# Patient Record
Sex: Male | Born: 1945
Health system: Southern US, Community
[De-identification: ages and names within clinical notes are randomized; demographics above are authoritative.]

## PROBLEM LIST (undated history)

## (undated) DIAGNOSIS — I4891 Unspecified atrial fibrillation: Secondary | ICD-10-CM

## (undated) DIAGNOSIS — R079 Chest pain, unspecified: Secondary | ICD-10-CM

## (undated) DIAGNOSIS — E785 Hyperlipidemia, unspecified: Secondary | ICD-10-CM

## (undated) DIAGNOSIS — R002 Palpitations: Secondary | ICD-10-CM

## (undated) DIAGNOSIS — I10 Essential (primary) hypertension: Secondary | ICD-10-CM

## (undated) DIAGNOSIS — Z8249 Family history of ischemic heart disease and other diseases of the circulatory system: Secondary | ICD-10-CM

## (undated) DIAGNOSIS — K219 Gastro-esophageal reflux disease without esophagitis: Secondary | ICD-10-CM

## (undated) HISTORY — DX: Hyperlipidemia, unspecified: E78.5

## (undated) HISTORY — PX: VASECTOMY: SHX75

## (undated) HISTORY — DX: Essential (primary) hypertension: I10

## (undated) HISTORY — PX: KNEE ARTHROSCOPY: SHX127

## (undated) HISTORY — DX: Unspecified atrial fibrillation: I48.91

## (undated) HISTORY — PX: ELBOW SURGERY: SHX618

## (undated) HISTORY — DX: Chest pain, unspecified: R07.9

## (undated) HISTORY — DX: Palpitations: R00.2

## (undated) HISTORY — DX: Family history of ischemic heart disease and other diseases of the circulatory system: Z82.49

## (undated) HISTORY — DX: Gastro-esophageal reflux disease without esophagitis: K21.9

---

## 1998-05-25 ENCOUNTER — Ambulatory Visit (HOSPITAL_COMMUNITY): Admission: RE | Admit: 1998-05-25 | Discharge: 1998-05-25 | Payer: Self-pay | Admitting: Gastroenterology

## 2004-04-16 ENCOUNTER — Ambulatory Visit: Payer: Self-pay | Admitting: Cardiology

## 2004-05-28 ENCOUNTER — Ambulatory Visit: Payer: Self-pay | Admitting: Cardiology

## 2009-04-05 ENCOUNTER — Telehealth (INDEPENDENT_AMBULATORY_CARE_PROVIDER_SITE_OTHER): Payer: Self-pay | Admitting: *Deleted

## 2009-04-05 ENCOUNTER — Emergency Department (HOSPITAL_COMMUNITY): Admission: EM | Admit: 2009-04-05 | Discharge: 2009-04-05 | Payer: Self-pay | Admitting: Emergency Medicine

## 2009-04-05 ENCOUNTER — Ambulatory Visit: Payer: Self-pay | Admitting: Cardiology

## 2009-04-09 ENCOUNTER — Ambulatory Visit: Payer: Self-pay | Admitting: Internal Medicine

## 2009-04-09 ENCOUNTER — Encounter (HOSPITAL_COMMUNITY): Admission: RE | Admit: 2009-04-09 | Discharge: 2009-05-30 | Payer: Self-pay | Admitting: Cardiology

## 2009-04-09 ENCOUNTER — Ambulatory Visit: Payer: Self-pay

## 2009-04-11 DIAGNOSIS — E785 Hyperlipidemia, unspecified: Secondary | ICD-10-CM | POA: Insufficient documentation

## 2009-04-11 DIAGNOSIS — I10 Essential (primary) hypertension: Secondary | ICD-10-CM | POA: Insufficient documentation

## 2009-04-11 DIAGNOSIS — K219 Gastro-esophageal reflux disease without esophagitis: Secondary | ICD-10-CM | POA: Insufficient documentation

## 2009-04-17 ENCOUNTER — Ambulatory Visit: Payer: Self-pay | Admitting: Cardiology

## 2009-04-17 DIAGNOSIS — R079 Chest pain, unspecified: Secondary | ICD-10-CM | POA: Insufficient documentation

## 2010-03-05 ENCOUNTER — Encounter: Payer: Self-pay | Admitting: Physician Assistant

## 2010-03-26 NOTE — Assessment & Plan Note (Signed)
Summary: eph/per Joseph Smith call/lg   Visit Type:  EPH Primary Provider:  Nena Jordan  CC:  pt states he has no complaints .  History of Present Illness: Joseph Smith comes in today for followup of his chest discomfort.  He was seen at urgent medical care and we sent to the emergency room. He they're saw Dr. Ron Parker who felt this was not cardiac. He was set up for a stress Myoview which was obtained on April 09, 2009. His ejection fraction was 60% with normal Kameren Pargas motion and no ischemia.  He's had no further symptoms. 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 He does has risk factors which are being addressed by his primary care.  Current Medications (verified): 1)  Zantac 300 Mg Tabs (Ranitidine Hcl) .Marland Kitchen.. 1 Tab Two Times A Day 2)  Simvastatin 40 Mg Tabs (Simvastatin) .Marland Kitchen.. 1 Tab At Bedtime 3)  Lotrel 10-20 Mg Caps (Amlodipine Besy-Benazepril Hcl) .Marland Kitchen.. 1 Cap Once Daily 4)  Advil 200 Mg Tabs (Ibuprofen) .Marland Kitchen.. 1 Tab 3-4 Times Daily 5)  Aspirin 81 Mg Tbec (Aspirin) .... Take One Tablet By Mouth Daily  Allergies: 1)  ! Codeine  Past History:  Past Surgical History: Last updated: 04/11/2009 Knee Arthroscopy.... right Elbow surgery.Marland Kitchenleft  Review of Systems       negative history of present illness  Vital Signs:  Patient profile:   65 year old male Height:      73 inches Weight:      216 pounds BMI:     28.60 Pulse rate:   64 / minute Pulse rhythm:   regular BP sitting:   130 / 80  (left arm) Cuff size:   large  Vitals Entered By: [name], CMA (April 17, 2009 11:34 AM)  Physical Exam  General:  Well developed, well nourished, in no acute distress. Head:  normocephalic and atraumatic Eyes:  PERRLA/EOM intact; conjunctiva and lids normal. Neck:  Neck supple, no JVD. No masses, thyromegaly or abnormal cervical nodes. Chest Oneida Mckamey:  no deformities or breast masses noted Lungs:  Clear bilaterally to auscultation and percussion. Heart:  Non-displaced PMI, chest non-tender; regular rate  and rhythm, S1, S2 without murmurs, rubs or gallops. Carotid upstroke normal, no bruit. Normal abdominal aortic size, no bruits. Femorals normal pulses, no bruits. Pedals normal pulses. No edema, no varicosities. Abdomen:  Bowel sounds positive; abdomen soft and non-tender without masses, organomegaly, or hernias noted. No hepatosplenomegaly. Msk:  Back normal, normal gait. Muscle strength and tone normal. Pulses:  pulses normal in all 4 extremities Extremities:  No clubbing or cyanosis. Neurologic:  Alert and oriented x 3. Skin:  Intact without lesions or rashes. Psych:  Normal affect.   Problems:  Medical Problems Added: 1)  Dx of Chest Pain-unspecified  (ICD-786.50) 2)  Dx of Chest Pain-unspecified  (ICD-786.50)  Impression & Recommendations:  Problem # 1:  CHEST PAIN-UNSPECIFIED (ICD-786.50) Assessment Improved he Then has had no recurrences. I have reviewed the findings of the stress test. No further workup indicated. 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 No limitations from a cardiac standpoint. His updated medication list for this problem includes:    Lotrel 10-20 Mg Caps (Amlodipine besy-benazepril hcl) .Marland Kitchen... 1 cap once daily    Aspirin 81 Mg Tbec (Aspirin) .Marland Kitchen... Take one tablet by mouth daily  Problem # 2:  CORONARY ARTERY DISEASE, PREMATURE, FAMILY HX (ICD-V17.3) Assessment: Unchanged  Orders: EKG w/ Interpretation (93000)  Patient Instructions: 1)  Your physician recommends that you schedule a follow-up  appointment in: [place] 2)  Your physician recommends that you continue on your current medications as directed. Please refer to the Current Medication list given to you today.

## 2010-03-26 NOTE — Progress Notes (Signed)
Summary: Nuclear pre procedure  Phone Note Outgoing Call Call back at Home Phone [phone]   Call placed by: Valetta Fuller, [place],  April 05, 2009 5:16 PM Call placed to: Patient Summary of Call: Reviewed information on Myoview Information Sheet (see scanned document for further details).  Spoke with patient.      Nuclear Med Background Indications for Stress Test: Evaluation for Ischemia, Post Hospital  Indications Comments: 04/05/09 NHA:[id number] and chest pain, negative enzymes    Symptoms: Chest Pain    Nuclear Pre-Procedure Cardiac Risk Factors: Family History - CAD, Hypertension, Lipids

## 2010-03-26 NOTE — Assessment & Plan Note (Signed)
Summary: Cardiology Nuclear Study  Nuclear Med Background Indications for Stress Test: Evaluation for Ischemia, [facility]  Indications Comments: 04/05/09 NID:[id number] and chest pain, negative enzymes  History: Abnormal EKG, GXT  History Comments:  ~14yr. ago GXT:OK per patient  Symptoms: Chest Pain, Diaphoresis, Rapid HR  Symptoms Comments: Last episode of CRW:ERXVsince discharge.   Nuclear Pre-Procedure Cardiac Risk Factors: Family History - CAD, History of Smoking, Hypertension, Lipids Caffeine/Decaff Intake: None NPO After: 6:00 PM Lungs: Clear IV 0.9% NS with Angio Cath: 22g     IV Site: (R) AC IV Started by: PIrven BaltimoreRN Chest Size (in) 44     Height (in): 73 Weight (lb): 211 BMI: 27.94  Nuclear Med Study 1 or 2 day study:  1 day     Stress Test Type:  LCarlton AdamReading MD:  DGlori Bickers MD     Referring MD:  TJenell Milliner MD Resting Radionuclide:  Technetium 943metrofosmin     Resting Radionuclide Dose:  10.6 mCi  Stress Radionuclide:  Technetium 9930mtrofosmin     Stress Radionuclide Dose:  32.0 mCi   Stress Protocol   Lexiscan: 0.4 mg   Stress Test Technologist:  SheValetta FullerA-N     Nuclear Technologist:  WanMariann Lasteral RT-N  Rest Procedure  Myocardial perfusion imaging was performed at rest 45 minutes following the intravenous administration of Myoview Technetium 44m64mrofosmin.  Stress Procedure  The patient received IV Lexiscan 0.4 mg over 15-seconds.  Myoview injected at 30-seconds.  There were more diffuse ST changes with lexiscan.  He denied any chest pain.  Quantitative spect images were obtained after a 45 minute delay.  QPS Raw Data Images:  Normal; no motion artifact; normal heart/lung ratio. Stress Images:  There is normal uptake in all areas. Rest Images:  Normal homogeneous uptake in all areas of the myocardium. Subtraction (SDS):  Normal Transient Ischemic Dilatation:  1.07  (Normal <1.22)  Lung/Heart Ratio:  .29  (Normal  <0.45)  Quantitative Gated Spect Images QGS EDV:  119 ml QGS ESV:  45 ml QGS EF:  62 % QGS cine images:  Normal  Findings Normal nuclear study      Overall Impression  Exercise Capacity: [place] with no exercise. ECG Impression: Baseline: NSR; No significant ST segment change with Lexiscan. Overall Impression: Normal stress nuclear study.  Appended Document: Cardiology Nuclear Study stable, reassurance. [facility]  Appended Document: Cardiology Nuclear Study LMTCB../CYMarland Kitchen Appended Document: Cardiology Nuclear Study PT AWARE./CY

## 2010-04-23 ENCOUNTER — Encounter: Payer: Self-pay | Admitting: Physician Assistant

## 2010-04-23 ENCOUNTER — Encounter (INDEPENDENT_AMBULATORY_CARE_PROVIDER_SITE_OTHER): Payer: BC Managed Care – PPO

## 2010-04-23 ENCOUNTER — Other Ambulatory Visit: Payer: Self-pay | Admitting: Physician Assistant

## 2010-04-23 ENCOUNTER — Ambulatory Visit (INDEPENDENT_AMBULATORY_CARE_PROVIDER_SITE_OTHER): Payer: BC Managed Care – PPO | Admitting: Physician Assistant

## 2010-04-23 DIAGNOSIS — R079 Chest pain, unspecified: Secondary | ICD-10-CM

## 2010-04-23 DIAGNOSIS — R002 Palpitations: Secondary | ICD-10-CM

## 2010-04-23 LAB — BASIC METABOLIC PANEL
BUN: 15 mg/dL (ref 6–23)
CO2: 27 mEq/L (ref 19–32)
Calcium: 8.8 mg/dL (ref 8.4–10.5)
Chloride: 99 mEq/L (ref 96–112)
Creatinine, Ser: 0.9 mg/dL (ref 0.4–1.5)
GFR: 87.08 mL/min (ref >60.00)
Glucose, Bld: 88 mg/dL (ref 70–99)
Potassium: 4.6 mEq/L (ref 3.5–5.1)
Sodium: 136 mEq/L (ref 135–145)

## 2010-04-23 LAB — MAGNESIUM: Magnesium: 2.2 mg/dL (ref 1.5–2.5)

## 2010-04-29 ENCOUNTER — Telehealth: Payer: Self-pay | Admitting: Cardiology

## 2010-05-02 NOTE — Assessment & Plan Note (Signed)
Summary: rov/Irregular heart beat/per pt call=mj   Primary Provider:  Nena Jordan   History of Present Illness: Primary Cardiologist:  Dr. Jenell Milliner   Joseph Smith is a 65 yo male with a h/o hypertension, hyperlipidemia and GERD who presents for evaluation of an irregular heartbeat.  He had a Myoview in Feb. 2011 that demonstrated an EF of 62% and no ischemia.  He recently was noted to have Hemoccult-positive stools on physical exam.  He was referred to gastroenterology and underwent colonoscopy.  He tells me that this was a somewhat rough procedure.  He awoke several times.  He was diagnosed with a low level colitis and placed on mesalamine.  He then developed an upper respiratory tract infection as well as a gastroenteritis.  He was sick for about 2 weeks.  As his symptoms were improving, he began to note palpitations.  These can occur at any time.  It feels like his heart is irregular.  He denies rapid palpitations.  He denies any associated chest pain, shortness of breath, syncope or near syncope.  He denies exertional chest discomfort or shortness of breath.  He denies orthopnea, PND or pedal edema.  Current Medications (verified): 1)  Zantac 300 Mg Tabs (Ranitidine Hcl) .Marland Kitchen.. 1 Tab Two Times A Day 2)  Pravastatin Sodium 40 Mg Tabs (Pravastatin Sodium) .... Once Daily 3)  Lotrel 10-20 Mg Caps (Amlodipine Besy-Benazepril Hcl) .Marland Kitchen.. 1 Cap Once Daily 4)  Advil 200 Mg Tabs (Ibuprofen) .Marland Kitchen.. 1 Tab 3-4 Times Daily 5)  Lorazepam 0.5 Mg Tabs (Lorazepam) .... As Needed, At Bedtime 6)  Lialda 1.2 Gm Tbec (Mesalamine) .... 4 Tabs Every Morning  Allergies (verified): 1)  ! Codeine  Past History:  Past Medical History: HYPERLIPIDEMIA (ICD-272.4) HYPERTENSION (ICD-401.9) GERD (ICD-530.81) Myoview 03/2009: EF 62%; no ischemia Ulcerative Colitis  Social History: He lives in [place] with his wife.   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     He has an approximately 25  pack-year history of tobacco use and has smoked cigars in the past but  has done no tobacco products for at least 5-10 years.  He drinks 2-3 ounces of alcohol a day.  He denies drug use.   Review of Systems       As per  the HPI.  All other systems reviewed and negative.   Vital Signs:  Patient profile:   65 year old male Height:      73 inches Weight:      221 pounds BMI:     29.26 Pulse rate:   75 / minute BP sitting:   130 / 77  (left arm) Cuff size:   large  Vitals Entered By: Hansel Feinstein CMA (April 23, 2010 11:34 AM)  Physical Exam  General:  Well nourished, well developed, in no acute distress HEENT: normal Neck: no JVD Cardiac:  normal S1, S2; RRR; no murmur Lungs:  clear to auscultation bilaterally, no wheezing, rhonchi or rales Abd: soft, nontender, no hepatomegaly Ext: no  edema Vascular: no carotid  bruits Skin: warm and dry Neuro:  CNs 2-12 intact, no focal abnormalities noted Endo: no thyromegaly   EKG  Procedure date:  04/23/2010  Findings:      normal sinus rhythm Heart rate 73 Normal axis Nonspecific ST-T wave changes  Impression & Recommendations:  Problem # 1:  PALPITATIONS (ICD-785.1) Etiology unclear.  With his history of hypertension, he is certainly at risk for developing atrial  fibrillation.  His symptoms do not sound consistent with this.  However, I will pursue further workup to include an echocardiogram to assess cardiac structure and an event monitor.  I also suspect that he may have some electrolyte abnormalities from his recent illness and may be having PVC/PACs.  I will obtain a basic metabolic panel and magnesium.  He did have a TSH done with his primary care provider recently.  We will obtain those results.  No medication changes were made today.  I will bring him back in followup with Dr. Verl Blalock.  Other Orders: EKG w/ Interpretation (93000) TLB-BMP (Basic Metabolic Panel-BMET)  (89791-RWCHJSC) TLB-Magnesium (Mg) (83735-MG) Event (Event) Echocardiogram (Echo)  Patient Instructions: 1)  Your physician recommends that you schedule a follow-up appointment in:  3-4 weeks with Dr. Verl Blalock as per Richardson Dopp, PA-C. 2)  Your physician has recommended that you wear an event monitor.  Event monitors are medical devices that record the heart's electrical activity. Doctors most often use these monitors to diagnose arrhythmias. Arrhythmias are problems with the speed or rhythm of the heartbeat. The monitor is a small, portable device. You can wear one while you do your normal daily activities. This is usually used to diagnose what is causing palpitations/syncope (passing out). 3)  Your physician has requested that you have an echocardiogram.  Echocardiography is a painless test that uses sound waves to create images of your heart. It provides your doctor with information about the size and shape of your heart and how well your heart's chambers and valves are working.  This procedure takes approximately one hour. There are no restrictions for this procedure. 4)  Your physician recommends that you return for lab work in: Today BMET 785.1, 786.50, MAG 785.1 5)  Your physician recommends that you continue on your current medications as directed. Please refer to the Current Medication list given to you today.  Appended Document: rov/Irregular heart beat/per pt call=mj TSH 3.731 on 03/05/10

## 2010-05-03 ENCOUNTER — Ambulatory Visit (HOSPITAL_COMMUNITY): Payer: BC Managed Care – PPO | Attending: Cardiology

## 2010-05-03 DIAGNOSIS — Z8249 Family history of ischemic heart disease and other diseases of the circulatory system: Secondary | ICD-10-CM | POA: Insufficient documentation

## 2010-05-03 DIAGNOSIS — E785 Hyperlipidemia, unspecified: Secondary | ICD-10-CM | POA: Insufficient documentation

## 2010-05-03 DIAGNOSIS — I10 Essential (primary) hypertension: Secondary | ICD-10-CM | POA: Insufficient documentation

## 2010-05-03 DIAGNOSIS — R079 Chest pain, unspecified: Secondary | ICD-10-CM | POA: Insufficient documentation

## 2010-05-03 DIAGNOSIS — R072 Precordial pain: Secondary | ICD-10-CM

## 2010-05-07 NOTE — Progress Notes (Signed)
Summary: Monitor  Phone Note Outgoing Call   Call placed by: Thompson Grayer, RN, BSN,  April 29, 2010 9:49 AM Call placed to: Patient Summary of Call: Pt wearing monitor which shows new Afib. Reviewed by Dr. Verl Blalock who gave orders to start pt on metoprolol succinate 50 mg by mouth daily and ASA 325 mg by mouth daily and schedule office visit with Dr. Verl Blalock on May 10, 2010. Pt also needs to keep scheduled appt for echo on March 9.  Left message on home and work number to call back. Initial call taken by: Thompson Grayer, RN, BSN,  April 29, 2010 9:54 AM  Follow-up for Phone Call        Spoke with pt and gave him monitor results. Pt states he is feeling OK and not aware of irregular heart beat.  Pt given instructions from Dr. Verl Blalock regarding metoprolol succinate and ASA. Will send to [facility] per pt request. Pt scheduled for follow up with Dr. Verl Blalock on March 16 at 10:15. Pt aware of scheduled echo appt. Follow-up by: Thompson Grayer, RN, BSN,  April 29, 2010 10:34 AM    New/Updated Medications: ASPIRIN EC 325 MG TBEC (ASPIRIN) Take one tablet by mouth daily METOPROLOL SUCCINATE 50 MG XR24H-TAB (METOPROLOL SUCCINATE) Take one tablet by mouth daily Prescriptions: METOPROLOL SUCCINATE 50 MG XR24H-TAB (METOPROLOL SUCCINATE) Take one tablet by mouth daily  #30 x 6   Entered by:   Thompson Grayer, RN, BSN   Authorized by:   Renella Cunas, MD, [facility]   Signed by:   Thompson Grayer, RN, BSN on 04/29/2010   Method used:   Electronically to        [id number] (retail)       [address]       [place], [place]  [address]       Ph: [phone] or [phone]       Fax: [phone]   RxID:   [id number]

## 2010-05-10 ENCOUNTER — Ambulatory Visit (INDEPENDENT_AMBULATORY_CARE_PROVIDER_SITE_OTHER): Payer: BC Managed Care – PPO | Admitting: Cardiology

## 2010-05-10 ENCOUNTER — Encounter: Payer: Self-pay | Admitting: Cardiology

## 2010-05-10 DIAGNOSIS — I4891 Unspecified atrial fibrillation: Secondary | ICD-10-CM

## 2010-05-14 NOTE — Assessment & Plan Note (Signed)
Summary: Joseph Smith   Visit Type:  Follow-up Primary Provider:  Nena Smith  CC:  no complaints.  History of Present Illness: Joseph Smith returns for followup of newly diagnosed PAF. See monitor report. Echo was nl except for mild Joseph and mild LAE. Stress Nuclear negative. Tolerating metoprolol without difficulty.  Current Medications (verified): 1)  Zantac 300 Mg Tabs (Ranitidine Hcl) .Marland Kitchen.. 1 Tab Two Times A Day 2)  Pravastatin Sodium 40 Mg Tabs (Pravastatin Sodium) .... Once Daily 3)  Lotrel 10-20 Mg Caps (Amlodipine Besy-Benazepril Hcl) .Marland Kitchen.. 1 Cap Once Daily 4)  Advil 200 Mg Tabs (Ibuprofen) .Marland Kitchen.. 1 Tab Joseph Times Daily 5)  Lorazepam 0.5 Mg Tabs (Lorazepam) .... As Needed, At Bedtime 6)  Aspirin Ec 325 Mg Tbec (Aspirin) .... Take One Tablet By Mouth Daily 7)  Metoprolol Succinate 50 Mg Xr24h-Tab (Metoprolol Succinate) .... Take One Tablet By Mouth Daily  Allergies (verified): 1)  ! Codeine  Past History:  Past Medical History: Last updated: 04/23/2010 HYPERLIPIDEMIA (ICD-272.4) HYPERTENSION (ICD-401.9) GERD (ICD-530.81) Myoview 03/2009: EF 62%; no ischemia Ulcerative Colitis  Past Surgical History: Last updated: 04/11/2009 Knee Arthroscopy.... right Elbow surgery.Marland Kitchenleft  Family History: Last updated: 04/11/2009 Family history of premature coronary artery disease.   His mother died at 34 with no history of heart disease  and his father died at 13 with a long history of cardiac issues.  No  siblings have coronary artery disease.     Social History: Last updated: 04/23/2010 He lives in [place] with his wife.   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   He has an approximately 25  pack-year history of tobacco use and has smoked cigars in the past but  has done no tobacco products for at least 5-10 years.  He drinks 2-3 ounces of alcohol a day.  He denies drug use.   Vital Signs:  Patient profile:   65 year old  male Height:      73 inches Weight:      220.50 pounds BMI:     29.20 Pulse rate:   64 / minute BP sitting:   114 / 78  (left arm) Cuff size:   large  Vitals Entered By: Joseph Smith (May 10, 2010 10:25 AM)  Physical Exam  General:  Well developed, well nourished, in no acute distress. Head:  normocephalic and atraumatic Heart:  RRR, Nl S1 and S2   Impression & Recommendations:  Problem # 1:  ATRIAL FIBRILLATION (ICD-427.31) Assessment New In NSR today. All studies reviewed. No change in meds. His updated medication list for this problem includes:    Aspirin Ec 325 Mg Tbec (Aspirin) .Marland Kitchen... Take one tablet by mouth daily    Metoprolol Succinate 50 Mg Xr24h-tab (Metoprolol succinate) .Marland Kitchen... Take one tablet by mouth daily  Problem # 2:  PALPITATIONS (ICD-785.1) Assessment: Improved  His updated medication list for this problem includes:    Lotrel 10-20 Mg Caps (Amlodipine besy-benazepril hcl) .Marland Kitchen... 1 cap once daily    Aspirin Ec 325 Mg Tbec (Aspirin) .Marland Kitchen... Take one tablet by mouth daily    Metoprolol Succinate 50 Mg Xr24h-tab (Metoprolol succinate) .Marland Kitchen... Take one tablet by mouth daily  Patient Instructions: 1)  Your physician recommends that you schedule a follow-up appointment in: 1 year with Dr. Verl Smith 2)  Your physician recommends that you continue on your current medications as directed. Please refer to the Current Medication list given to you today.

## 2010-05-16 LAB — POCT CARDIAC MARKERS
CKMB, poc: 1.5 ng/mL (ref 1.0–8.0)
CKMB, poc: <1 ng/mL — ABNORMAL LOW (ref 1.0–8.0)
Myoglobin, poc: 52.7 ng/mL (ref 12–200)
Myoglobin, poc: 60.2 ng/mL (ref 12–200)
Troponin i, poc: <0.05 ng/mL (ref 0.00–0.09)
Troponin i, poc: <0.05 ng/mL (ref 0.00–0.09)

## 2010-05-16 LAB — DIFFERENTIAL
Basophils Absolute: 0 10*3/uL (ref 0.0–0.1)
Basophils Relative: 0 % (ref 0–1)
Eosinophils Absolute: 0 10*3/uL (ref 0.0–0.7)
Eosinophils Relative: 0 % (ref 0–5)
Lymphocytes Relative: 17 % (ref 12–46)
Lymphs Abs: 0.9 10*3/uL (ref 0.7–4.0)
Monocytes Absolute: 0.3 10*3/uL (ref 0.1–1.0)
Monocytes Relative: 5 % (ref 3–12)
Neutro Abs: 4.3 10*3/uL (ref 1.7–7.7)
Neutrophils Relative %: 78 % — ABNORMAL HIGH (ref 43–77)

## 2010-05-16 LAB — CBC
HCT: 38.4 % — ABNORMAL LOW (ref 39.0–52.0)
Hemoglobin: 13.4 g/dL (ref 13.0–17.0)
MCHC: 35 g/dL (ref 30.0–36.0)
MCV: 93.1 fL (ref 78.0–100.0)
Platelets: 204 10*3/uL (ref 150–400)
RBC: 4.12 MIL/uL — ABNORMAL LOW (ref 4.22–5.81)
RDW: 12.9 % (ref 11.5–15.5)
WBC: 5.5 10*3/uL (ref 4.0–10.5)

## 2010-05-16 LAB — BASIC METABOLIC PANEL
BUN: 12 mg/dL (ref 6–23)
CO2: 26 mEq/L (ref 19–32)
Calcium: 9.1 mg/dL (ref 8.4–10.5)
Chloride: 107 mEq/L (ref 96–112)
Creatinine, Ser: 0.92 mg/dL (ref 0.4–1.5)
GFR calc Af Amer: >60 mL/min (ref >60)
GFR calc non Af Amer: >60 mL/min (ref >60)
Glucose, Bld: 112 mg/dL — ABNORMAL HIGH (ref 70–99)
Potassium: 4.1 mEq/L (ref 3.5–5.1)
Sodium: 141 mEq/L (ref 135–145)

## 2010-05-24 ENCOUNTER — Ambulatory Visit: Payer: BC Managed Care – PPO | Admitting: Cardiology

## 2011-02-04 ENCOUNTER — Ambulatory Visit (INDEPENDENT_AMBULATORY_CARE_PROVIDER_SITE_OTHER): Payer: BC Managed Care – PPO | Admitting: Emergency Medicine

## 2011-02-04 DIAGNOSIS — Z79899 Other long term (current) drug therapy: Secondary | ICD-10-CM

## 2011-02-04 DIAGNOSIS — I10 Essential (primary) hypertension: Secondary | ICD-10-CM

## 2011-02-04 DIAGNOSIS — Z23 Encounter for immunization: Secondary | ICD-10-CM

## 2011-02-04 DIAGNOSIS — E782 Mixed hyperlipidemia: Secondary | ICD-10-CM

## 2011-02-04 DIAGNOSIS — R7989 Other specified abnormal findings of blood chemistry: Secondary | ICD-10-CM

## 2011-02-05 ENCOUNTER — Encounter: Payer: Self-pay | Admitting: Family Medicine

## 2011-02-05 DIAGNOSIS — R739 Hyperglycemia, unspecified: Secondary | ICD-10-CM | POA: Insufficient documentation

## 2011-02-05 DIAGNOSIS — N529 Male erectile dysfunction, unspecified: Secondary | ICD-10-CM | POA: Insufficient documentation

## 2011-03-03 ENCOUNTER — Telehealth: Payer: Self-pay | Admitting: Cardiology

## 2011-03-03 NOTE — Telephone Encounter (Addendum)
LOV,12,Stress faxed to [facility] @ [phone]  03/03/11/KM

## 2011-03-06 ENCOUNTER — Telehealth: Payer: Self-pay | Admitting: *Deleted

## 2011-03-06 NOTE — Telephone Encounter (Signed)
Susanne [facility] called to request for Dr. Verl Blalock  To aloud pt to be  off Aspirin 325 mg  for shoulder surgery. Procedure is  scheduled for  03/12/11. Patient is on aspirin due to Atrial fibrillation.

## 2011-03-07 ENCOUNTER — Telehealth: Payer: Self-pay | Admitting: Cardiology

## 2011-03-07 NOTE — Telephone Encounter (Signed)
Informed to stay on asa per dr Aundra Dubin.

## 2011-03-07 NOTE — Telephone Encounter (Signed)
Pt to have shoulder surgery Thursday  and needs to go off asa for 5 days starting today, pt has a-fib, pls call

## 2011-04-28 ENCOUNTER — Ambulatory Visit (INDEPENDENT_AMBULATORY_CARE_PROVIDER_SITE_OTHER): Payer: BC Managed Care – PPO | Admitting: Family Medicine

## 2011-04-28 VITALS — BP 126/74 | HR 58 | Temp 98.2°F | Resp 18 | Ht 72.0 in | Wt 214.0 lb

## 2011-04-28 DIAGNOSIS — H902 Conductive hearing loss, unspecified: Secondary | ICD-10-CM

## 2011-04-28 DIAGNOSIS — I10 Essential (primary) hypertension: Secondary | ICD-10-CM

## 2011-04-28 NOTE — Progress Notes (Signed)
  Patient Name: Joseph Smith Date of Birth: 1945-12-26 Medical Record Number: [id number] Gender: male Date of Encounter: 04/28/2011  History of Present Illness:  Joseph Smith is a 66 y.o. 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  Also needs a drug screen for the same reason.  He feels well and sees Dr. Everlene Farrier every 6 months for health maintencne- last labs were in December.  He actually has a physical exam with Dr. Everlene Farrier scheduled for April. He is generally healthy except for problems listed below which are controlled.  See blue sheet for further history details  Patient Active Problem List  Diagnoses  . HYPERLIPIDEMIA  . HYPERTENSION  . GERD  . CHEST PAIN-UNSPECIFIED  . PALPITATIONS  . ATRIAL FIBRILLATION  . Hyperglycemia  . Impotence due to erectile dysfunction   No past medical history on file. No past surgical history on file. History  Substance Use Topics  . Smoking status: Former Research scientist (life sciences)  . Smokeless tobacco: Not on file  . Alcohol Use: Not on file   No family history on file. Allergies  Allergen Reactions  . Codeine Itching    REACTION: Reaction not known    Medication list has been reviewed and updated.  Review of Systems: As per HPI- otherwise negative.   Physical Examination: Filed Vitals:   04/28/11 1108  BP: 126/74  Pulse: 58  Temp: 98.2 F (36.8 C)  TempSrc: Oral  Resp: 18  Height: 6' (1.829 m)  Weight: 214 lb (97.07 kg)    Body mass index is 29.02 kg/(m^2).  GEN: WDWN, NAD, Non-toxic, A & O x 3 HEENT: Atraumatic, Normocephalic. Neck supple. No masses, No LAD.  TM, ear canals wnl bilaterally, oropharynx wnl Ears and Nose: No external deformity. CV: RRR, No M/G/R. No JVD. No thrill. No extra heart sounds. PULM: CTA B, no wheezes, crackles, rhonchi. No retractions. No resp. distress. No accessory muscle use. ABD: S, NT, ND, +BS. No rebound. No HSM. GU: no inguinal hernia EXTR: No  c/c/e NEURO Normal gait.  PSYCH: Normally interactive. Conversant. Not depressed or anxious appearing.  Calm demeanor.    Assessment and Plan: 1. Hypertension   2. Hearing loss    See audiometry results- unfortunately could not certify for DOT today due to hearing loss.   He plans to see a hearing clinic and may get hearing aids.  If he brings in audiologist report showing acceptable hearing he may be certified with hearing aid.

## 2011-05-09 ENCOUNTER — Encounter: Payer: Self-pay | Admitting: *Deleted

## 2011-05-12 ENCOUNTER — Encounter: Payer: Self-pay | Admitting: Family Medicine

## 2011-05-14 ENCOUNTER — Encounter: Payer: Self-pay | Admitting: Family Medicine

## 2011-05-14 ENCOUNTER — Ambulatory Visit: Payer: Self-pay | Admitting: Cardiology

## 2011-06-10 ENCOUNTER — Ambulatory Visit: Payer: BC Managed Care – PPO | Admitting: Emergency Medicine

## 2011-08-11 ENCOUNTER — Other Ambulatory Visit: Payer: Self-pay | Admitting: Emergency Medicine

## 2011-09-04 ENCOUNTER — Other Ambulatory Visit: Payer: Self-pay | Admitting: *Deleted

## 2011-09-04 MED ORDER — AMLODIPINE BESY-BENAZEPRIL HCL 10-20 MG PO CAPS: 1.0000 | ORAL_CAPSULE | Freq: Every day | ORAL | Status: DC

## 2011-09-20 ENCOUNTER — Other Ambulatory Visit: Payer: Self-pay | Admitting: Emergency Medicine

## 2011-11-18 ENCOUNTER — Ambulatory Visit: Payer: BC Managed Care – PPO | Admitting: Emergency Medicine

## 2011-11-30 ENCOUNTER — Other Ambulatory Visit: Payer: Self-pay | Admitting: Emergency Medicine

## 2011-11-30 ENCOUNTER — Ambulatory Visit (INDEPENDENT_AMBULATORY_CARE_PROVIDER_SITE_OTHER): Payer: BC Managed Care – PPO | Admitting: Emergency Medicine

## 2011-11-30 VITALS — BP 135/78 | HR 60 | Temp 98.4°F | Resp 16 | Ht 73.0 in | Wt 218.0 lb

## 2011-11-30 DIAGNOSIS — Z23 Encounter for immunization: Secondary | ICD-10-CM

## 2011-11-30 DIAGNOSIS — I4891 Unspecified atrial fibrillation: Secondary | ICD-10-CM

## 2011-11-30 DIAGNOSIS — I10 Essential (primary) hypertension: Secondary | ICD-10-CM

## 2011-11-30 DIAGNOSIS — E7800 Pure hypercholesterolemia, unspecified: Secondary | ICD-10-CM

## 2011-11-30 LAB — COMPREHENSIVE METABOLIC PANEL
ALT: 17 U/L (ref 0–53)
AST: 19 U/L (ref 0–37)
Albumin: 5 g/dL (ref 3.5–5.2)
Alkaline Phosphatase: 54 U/L (ref 39–117)
BUN: 18 mg/dL (ref 6–23)
CO2: 25 mEq/L (ref 19–32)
Calcium: 9.6 mg/dL (ref 8.4–10.5)
Chloride: 104 mEq/L (ref 96–112)
Creat: 0.97 mg/dL (ref 0.50–1.35)
Glucose, Bld: 101 mg/dL — ABNORMAL HIGH (ref 70–99)
Potassium: 4.4 mEq/L (ref 3.5–5.3)
Sodium: 140 mEq/L (ref 135–145)
Total Bilirubin: 0.8 mg/dL (ref 0.3–1.2)
Total Protein: 7.5 g/dL (ref 6.0–8.3)

## 2011-11-30 LAB — POCT CBC
Granulocyte percent: 61.4 %G (ref 37–80)
HCT, POC: 45 % (ref 43.5–53.7)
Hemoglobin: 14.2 g/dL (ref 14.1–18.1)
Lymph, poc: 1.7 (ref 0.6–3.4)
MCH, POC: 30.6 pg (ref 27–31.2)
MCHC: 31.6 g/dL — AB (ref 31.8–35.4)
MCV: 96.9 fL (ref 80–97)
MID (cbc): 0.3 (ref 0–0.9)
MPV: 7.6 fL (ref 0–99.8)
POC Granulocyte: 3.2 (ref 2–6.9)
POC LYMPH PERCENT: 32.8 %L (ref 10–50)
POC MID %: 5.8 %M (ref 0–12)
Platelet Count, POC: 248 10*3/uL (ref 142–424)
RBC: 4.64 M/uL — AB (ref 4.69–6.13)
RDW, POC: 13.8 %
WBC: 5.2 10*3/uL (ref 4.6–10.2)

## 2011-11-30 LAB — LIPID PANEL
Cholesterol: 195 mg/dL (ref 0–200)
HDL: 60 mg/dL (ref >39)
LDL Cholesterol: 114 mg/dL — ABNORMAL HIGH (ref 0–99)
Total CHOL/HDL Ratio: 3.3 Ratio
Triglycerides: 104 mg/dL (ref <150)
VLDL: 21 mg/dL (ref 0–40)

## 2011-11-30 MED ORDER — METOPROLOL TARTRATE 50 MG PO TABS: 50.0000 mg | ORAL_TABLET | Freq: Every day | ORAL | Status: DC

## 2011-11-30 MED ORDER — AMLODIPINE BESY-BENAZEPRIL HCL 10-20 MG PO CAPS: 1.0000 | ORAL_CAPSULE | Freq: Every day | ORAL | Status: DC

## 2011-11-30 MED ORDER — PRAVASTATIN SODIUM 40 MG PO TABS: 40.0000 mg | ORAL_TABLET | Freq: Every day | ORAL | Status: DC

## 2011-11-30 MED ORDER — RANITIDINE HCL 300 MG PO CAPS: 300.0000 mg | ORAL_CAPSULE | Freq: Every evening | ORAL | Status: DC

## 2011-11-30 MED ORDER — LORAZEPAM 0.5 MG PO TABS: 0.5000 mg | ORAL_TABLET | ORAL | Status: DC | PRN

## 2011-11-30 NOTE — Progress Notes (Signed)
  Subjective:    Patient ID: Joseph Smith, male    DOB: [birth year], 66 y.o.   MRN: [id number]  HPI patient here to get refills on all his medicines. History of atrial fibrillation and is followed by Dr. wall for this. His sugars have been high in the past but not recently. He is not on diabetes medications but he is on medications to control his lipids and hypertension. Patient feels well he has not had any chest pain shortness of breath or any respiratory complaints at the present time.    Review of Systems     Objective:   Physical Exam HEENT exam is unremarkable. Neck is supple. Carotids are 2+ without bruits. Thyroid is not enlarged. Chest is clear to auscultation and percussion. Cardiac exam is unremarkable.  Results for orders placed in visit on 11/30/11  POCT CBC      Component Value Range   WBC 5.2  4.6 - 10.2 K/uL   Lymph, poc 1.7  0.6 - 3.4   POC LYMPH PERCENT 32.8  10 - 50 %L   MID (cbc) 0.3  0 - 0.9   POC MID % 5.8  0 - 12 %M   POC Granulocyte 3.2  2 - 6.9   Granulocyte percent 61.4  37 - 80 %G   RBC 4.64 (*) 4.69 - 6.13 M/uL   Hemoglobin 14.2  14.1 - 18.1 g/dL   HCT, POC 45.0  43.5 - 53.7 %   MCV 96.9  80 - 97 fL   MCH, POC 30.6  27 - 31.2 pg   MCHC 31.6 (*) 31.8 - 35.4 g/dL   RDW, POC 13.8     Platelet Count, POC 248  142 - 424 K/uL   MPV 7.6  0 - 99.8 fL        Assessment & Plan:  Meds were refilled labs done recheck 6 months

## 2012-05-28 ENCOUNTER — Ambulatory Visit (INDEPENDENT_AMBULATORY_CARE_PROVIDER_SITE_OTHER): Payer: BC Managed Care – PPO | Admitting: Emergency Medicine

## 2012-05-28 ENCOUNTER — Ambulatory Visit: Payer: BC Managed Care – PPO

## 2012-05-28 VITALS — BP 130/90 | HR 77 | Temp 98.0°F | Resp 18 | Wt 225.0 lb

## 2012-05-28 DIAGNOSIS — R05 Cough: Secondary | ICD-10-CM

## 2012-05-28 DIAGNOSIS — R070 Pain in throat: Secondary | ICD-10-CM

## 2012-05-28 DIAGNOSIS — J069 Acute upper respiratory infection, unspecified: Secondary | ICD-10-CM

## 2012-05-28 DIAGNOSIS — J029 Acute pharyngitis, unspecified: Secondary | ICD-10-CM

## 2012-05-28 DIAGNOSIS — R059 Cough, unspecified: Secondary | ICD-10-CM

## 2012-05-28 DIAGNOSIS — J209 Acute bronchitis, unspecified: Secondary | ICD-10-CM

## 2012-05-28 LAB — POCT CBC
Granulocyte percent: 68.6 %G (ref 37–80)
HCT, POC: 39.9 % — AB (ref 43.5–53.7)
Hemoglobin: 13.4 g/dL — AB (ref 14.1–18.1)
Lymph, poc: 1.9 (ref 0.6–3.4)
MCH, POC: 31.8 pg — AB (ref 27–31.2)
MCHC: 33.6 g/dL (ref 31.8–35.4)
MCV: 94.6 fL (ref 80–97)
MID (cbc): 0.5 (ref 0–0.9)
MPV: 7.6 fL (ref 0–99.8)
POC Granulocyte: 5.3 (ref 2–6.9)
POC LYMPH PERCENT: 24.7 %L (ref 10–50)
POC MID %: 6.7 %M (ref 0–12)
Platelet Count, POC: 315 10*3/uL (ref 142–424)
RBC: 4.22 M/uL — AB (ref 4.69–6.13)
RDW, POC: 13.3 %
WBC: 7.7 10*3/uL (ref 4.6–10.2)

## 2012-05-28 LAB — POCT INFLUENZA A/B
Influenza A, POC: NEGATIVE
Influenza B, POC: NEGATIVE

## 2012-05-28 LAB — POCT RAPID STREP A (OFFICE): Rapid Strep A Screen: NEGATIVE

## 2012-05-28 MED ORDER — BENZONATATE 100 MG PO CAPS: 100.0000 mg | ORAL_CAPSULE | Freq: Three times a day (TID) | ORAL | Status: DC | PRN

## 2012-05-28 MED ORDER — AZITHROMYCIN 250 MG PO TABS: ORAL_TABLET | ORAL | Status: DC

## 2012-05-28 NOTE — Patient Instructions (Addendum)
Bronchitis Bronchitis is the body's way of reacting to injury and/or infection (inflammation) of the bronchi. Bronchi are the air tubes that extend from the windpipe into the lungs. If the inflammation becomes severe, it may cause shortness of breath. CAUSES  Inflammation may be caused by:  A virus.  Germs (bacteria).  Dust.  Allergens.  Pollutants and many other irritants. The cells lining the bronchial tree are covered with tiny hairs (cilia). These constantly beat upward, away from the lungs, toward the mouth. This keeps the lungs free of pollutants. When these cells become too irritated and are unable to do their job, mucus begins to develop. This causes the characteristic cough of bronchitis. The cough clears the lungs when the cilia are unable to do their job. Without either of these protective mechanisms, the mucus would settle in the lungs. Then you would develop pneumonia. Smoking is a common cause of bronchitis and can contribute to pneumonia. Stopping this habit is the single most important thing you can do to help yourself. TREATMENT   Your caregiver may prescribe an antibiotic if the cough is caused by bacteria. Also, medicines that open up your airways make it easier to breathe. Your caregiver may also recommend or prescribe an expectorant. It will loosen the mucus to be coughed up. Only take over-the-counter or prescription medicines for pain, discomfort, or fever as directed by your caregiver.  Removing whatever causes the problem (smoking, for example) is critical to preventing the problem from getting worse.  Cough suppressants may be prescribed for relief of cough symptoms.  Inhaled medicines may be prescribed to help with symptoms now and to help prevent problems from returning.  For those with recurrent (chronic) bronchitis, there may be a need for steroid medicines. SEEK IMMEDIATE MEDICAL CARE IF:   During treatment, you develop more pus-like mucus (purulent  sputum).  You have a fever.  Your baby is older than 3 months with a rectal temperature of 102 F (38.9 C) or higher.  Your baby is 64 months old or younger with a rectal temperature of 100.4 F (38 C) or higher.  You become progressively more ill.  You have increased difficulty breathing, wheezing, or shortness of breath. It is necessary to seek immediate medical care if you are elderly or sick from any other disease. MAKE SURE YOU:   Understand these instructions.  Will watch your condition.  Will get help right away if you are not doing well or get worse. Document Released: 02/10/2005 Document Revised: 05/05/2011 Document Reviewed: 12/21/2007 [facility] Patient Information 2013 [place].

## 2012-05-28 NOTE — Progress Notes (Signed)
Subjective:    Patient ID: Joseph Smith, male    DOB: 14-Jun-1945, 67 y.o.   MRN: [id number]  HPI traveled to [place] last week for [employer]. Friday night couldn't talk and felt bad. Started feeling better but his am woke up with felt like couldn't swallow, sore throat, PND, cough, productive, started yellow but now has turned to a cream brown color.  Is a former smoker but years ago.   Has red rash under L axilla. Does have burning sensation. Has a little on R axilla but not as bad. Did change deodorant recently.     Review of Systems     Objective:   Physical Exam patient is alert and cooperative and not ill-appearing. His neck is supple. His chest exam reveals a few rhonchi but no rales are audible. Cardiac exam is regular rate without murmurs. The TMs are clear. Nose has mild congestion. Posterior pharynx is mildly red.  [facility] reading (PRIMARY) by  Dr. Everlene Farrier there is no acute disease noted. The markings in the right base seemed to be mildly accentuated.  Results for orders placed in visit on 05/28/12  POCT CBC      Result Value Range   WBC 7.7  4.6 - 10.2 K/uL   Lymph, poc 1.9  0.6 - 3.4   POC LYMPH PERCENT 24.7  10 - 50 %L   MID (cbc) 0.5  0 - 0.9   POC MID % 6.7  0 - 12 %M   POC Granulocyte 5.3  2 - 6.9   Granulocyte percent 68.6  37 - 80 %G   RBC 4.22 (*) 4.69 - 6.13 M/uL   Hemoglobin 13.4 (*) 14.1 - 18.1 g/dL   HCT, POC 39.9 (*) 43.5 - 53.7 %   MCV 94.6  80 - 97 fL   MCH, POC 31.8 (*) 27 - 31.2 pg   MCHC 33.6  31.8 - 35.4 g/dL   RDW, POC 13.3     Platelet Count, POC 315  142 - 424 K/uL   MPV 7.6  0 - 99.8 fL  POCT INFLUENZA A/B      Result Value Range   Influenza A, POC Negative     Influenza B, POC Negative     Results for orders placed in visit on 05/28/12  POCT CBC      Result Value Range   WBC 7.7  4.6 - 10.2 K/uL   Lymph, poc 1.9  0.6 - 3.4   POC LYMPH PERCENT 24.7  10 - 50 %L   MID (cbc) 0.5  0 - 0.9   POC MID % 6.7  0 - 12 %M   POC Granulocyte  5.3  2 - 6.9   Granulocyte percent 68.6  37 - 80 %G   RBC 4.22 (*) 4.69 - 6.13 M/uL   Hemoglobin 13.4 (*) 14.1 - 18.1 g/dL   HCT, POC 39.9 (*) 43.5 - 53.7 %   MCV 94.6  80 - 97 fL   MCH, POC 31.8 (*) 27 - 31.2 pg   MCHC 33.6  31.8 - 35.4 g/dL   RDW, POC 13.3     Platelet Count, POC 315  142 - 424 K/uL   MPV 7.6  0 - 99.8 fL  POCT INFLUENZA A/B      Result Value Range   Influenza A, POC Negative     Influenza B, POC Negative    POCT RAPID STREP A (OFFICE)      Result Value Range  Rapid Strep A Screen Negative  Negative       Assessment & Plan:       Patient has negative flu negative strep normal white count with mild congestion on chest x-ray. Will treat with Mucinex, Tessalon Perles, and a Z-Pak.

## 2012-10-01 ENCOUNTER — Other Ambulatory Visit: Payer: Self-pay | Admitting: Radiology

## 2012-10-01 ENCOUNTER — Ambulatory Visit (INDEPENDENT_AMBULATORY_CARE_PROVIDER_SITE_OTHER): Payer: BC Managed Care – PPO | Admitting: Emergency Medicine

## 2012-10-01 VITALS — BP 130/84 | HR 49 | Temp 97.6°F | Resp 16 | Ht 71.0 in | Wt 220.0 lb

## 2012-10-01 DIAGNOSIS — E785 Hyperlipidemia, unspecified: Secondary | ICD-10-CM

## 2012-10-01 DIAGNOSIS — K6289 Other specified diseases of anus and rectum: Secondary | ICD-10-CM

## 2012-10-01 DIAGNOSIS — I10 Essential (primary) hypertension: Secondary | ICD-10-CM

## 2012-10-01 DIAGNOSIS — R609 Edema, unspecified: Secondary | ICD-10-CM

## 2012-10-01 DIAGNOSIS — K625 Hemorrhage of anus and rectum: Secondary | ICD-10-CM

## 2012-10-01 LAB — POCT CBC
Granulocyte percent: 68.8 %G (ref 37–80)
HCT, POC: 42.8 % — AB (ref 43.5–53.7)
Hemoglobin: 14 g/dL — AB (ref 14.1–18.1)
Lymph, poc: 1.2 (ref 0.6–3.4)
MCH, POC: 32 pg — AB (ref 27–31.2)
MCHC: 32.7 g/dL (ref 31.8–35.4)
MCV: 97.7 fL — AB (ref 80–97)
MID (cbc): 0.3 (ref 0–0.9)
MPV: 7.9 fL (ref 0–99.8)
POC Granulocyte: 3.4 (ref 2–6.9)
POC LYMPH PERCENT: 24.4 %L (ref 10–50)
POC MID %: 6.8 %M (ref 0–12)
Platelet Count, POC: 238 10*3/uL (ref 142–424)
RBC: 4.38 M/uL — AB (ref 4.69–6.13)
RDW, POC: 14.1 %
WBC: 4.9 10*3/uL (ref 4.6–10.2)

## 2012-10-01 LAB — IFOBT (OCCULT BLOOD): IFOBT: POSITIVE

## 2012-10-01 LAB — GLUCOSE, POCT (MANUAL RESULT ENTRY): POC Glucose: 76 mg/dl (ref 70–99)

## 2012-10-01 LAB — POCT GLYCOSYLATED HEMOGLOBIN (HGB A1C): Hemoglobin A1C: 5.3

## 2012-10-01 LAB — POCT SEDIMENTATION RATE: POCT SED RATE: 25 mm/hr — AB (ref 0–22)

## 2012-10-01 MED ORDER — AMLODIPINE BESY-BENAZEPRIL HCL 10-20 MG PO CAPS: 1.0000 | ORAL_CAPSULE | Freq: Every day | ORAL | Status: DC

## 2012-10-01 MED ORDER — LORAZEPAM 0.5 MG PO TABS: 0.5000 mg | ORAL_TABLET | ORAL | Status: DC | PRN

## 2012-10-01 MED ORDER — RANITIDINE HCL 300 MG PO CAPS: 300.0000 mg | ORAL_CAPSULE | Freq: Every evening | ORAL | Status: DC

## 2012-10-01 MED ORDER — PRAVASTATIN SODIUM 40 MG PO TABS: 40.0000 mg | ORAL_TABLET | Freq: Every day | ORAL | Status: DC

## 2012-10-01 MED ORDER — MESALAMINE 1.2 G PO TBEC: DELAYED_RELEASE_TABLET | ORAL | Status: DC

## 2012-10-01 MED ORDER — METOPROLOL TARTRATE 50 MG PO TABS: 50.0000 mg | ORAL_TABLET | Freq: Every day | ORAL | Status: DC

## 2012-10-01 NOTE — Progress Notes (Signed)
  Subjective:    Patient ID: Joseph Smith, male    DOB: 11-03-1945, 67 y.o.   MRN: [id number]  HPI 67 y.o. Male presents to clinic to discuss current medications . Is currently experiencing some side effects related to his medications .  Patient having swelling in both ankles at the end of the day. Has noticed this since starting BP med .; amlodipine. Patient is also concerned because he recently has seen some bright red blood per rectum. He had an endoscopy done January 2012 with findings of a low-grade colitis at that time. He was placed on Lialda. He took this for a short time and then stopped the   Review of Systems     Objective:   Physical Exam HEENT exam is unremarkable neck supple chest clear heart regular rate no murmurs abdomen soft nontender rectal exam reveals a normal-sized prostate no bright red blood was seen Results for orders placed in visit on 10/01/12  POCT CBC      Result Value Range   WBC 4.9  4.6 - 10.2 K/uL   Lymph, poc 1.2  0.6 - 3.4   POC LYMPH PERCENT 24.4  10 - 50 %L   MID (cbc) 0.3  0 - 0.9   POC MID % 6.8  0 - 12 %M   POC Granulocyte 3.4  2 - 6.9   Granulocyte percent 68.8  37 - 80 %G   RBC 4.38 (*) 4.69 - 6.13 M/uL   Hemoglobin 14.0 (*) 14.1 - 18.1 g/dL   HCT, POC 42.8 (*) 43.5 - 53.7 %   MCV 97.7 (*) 80 - 97 fL   MCH, POC 32.0 (*) 27 - 31.2 pg   MCHC 32.7  31.8 - 35.4 g/dL   RDW, POC 14.1     Platelet Count, POC 238  142 - 424 K/uL   MPV 7.9  0 - 99.8 fL  GLUCOSE, POCT (MANUAL RESULT ENTRY)      Result Value Range   POC Glucose 76  70 - 99 mg/dl  IFOBT (OCCULT BLOOD)      Result Value Range   IFOBT Positive    POCT GLYCOSYLATED HEMOGLOBIN (HGB A1C)      Result Value Range   Hemoglobin A1C 5.3           Assessment & Plan:  Patient has had a low-grade ulcerative colitis symptoms before we'll treat with Lialda 1.2 2 tablets a day. Routine labs were done meds were refilled

## 2012-10-02 LAB — COMPREHENSIVE METABOLIC PANEL
ALT: 15 U/L (ref 0–53)
AST: 17 U/L (ref 0–37)
Albumin: 4.5 g/dL (ref 3.5–5.2)
Alkaline Phosphatase: 58 U/L (ref 39–117)
BUN: 12 mg/dL (ref 6–23)
CO2: 26 mEq/L (ref 19–32)
Calcium: 9.2 mg/dL (ref 8.4–10.5)
Chloride: 105 mEq/L (ref 96–112)
Creat: 1.06 mg/dL (ref 0.50–1.35)
Glucose, Bld: 88 mg/dL (ref 70–99)
Potassium: 4.1 mEq/L (ref 3.5–5.3)
Sodium: 142 mEq/L (ref 135–145)
Total Bilirubin: 0.5 mg/dL (ref 0.3–1.2)
Total Protein: 7 g/dL (ref 6.0–8.3)

## 2012-10-26 ENCOUNTER — Ambulatory Visit (INDEPENDENT_AMBULATORY_CARE_PROVIDER_SITE_OTHER): Payer: BC Managed Care – PPO

## 2012-10-26 VITALS — BP 136/90 | HR 62 | Ht 73.0 in | Wt 227.1 lb

## 2012-10-26 DIAGNOSIS — I4891 Unspecified atrial fibrillation: Secondary | ICD-10-CM

## 2012-10-26 NOTE — Progress Notes (Signed)
Patient walked in office stating his atrial fib is acting up.Patient was taken to a room.Stated this past Saturday 10/23/12 he noticed his heart to be in and out of rhythm.Stated he was feeling ok this morning just thought he needed to have a EKG.EKG was done revealed sinus rhythm rate 62 beats/min.EKG was shown to DOD Dr.Crenshaw.Patient is a former patient of Dr.Wall.Appointment scheduled with Dr.Crenshaw 12/03/12.

## 2012-11-16 ENCOUNTER — Other Ambulatory Visit: Payer: Self-pay | Admitting: Emergency Medicine

## 2012-11-23 ENCOUNTER — Other Ambulatory Visit: Payer: Self-pay | Admitting: Emergency Medicine

## 2012-11-23 NOTE — Telephone Encounter (Signed)
Called pt back and he reported that he has always been on the metoprolol succ 24 hr tab since his cardiologist put him on it and the last Rx he p/up was for the tartrate. Pt requests that Rx be changed back to what he has been taking which is what the pharmacy has requested. I will send in RFs.

## 2012-11-23 NOTE — Telephone Encounter (Signed)
PT STATES HE WAS GIVEN A DIFFERENT KIND OF METOPROLOL THAN WHAT HE USE TO TAKE AND HAVE NO REFILLS. WOULD LIKE TO KNOW WHAT THE DIFFERENT IS PLEASE CALL [phone]

## 2012-11-26 ENCOUNTER — Other Ambulatory Visit: Payer: Self-pay | Admitting: Emergency Medicine

## 2012-12-01 ENCOUNTER — Other Ambulatory Visit: Payer: Self-pay | Admitting: Emergency Medicine

## 2012-12-03 ENCOUNTER — Other Ambulatory Visit: Payer: Self-pay | Admitting: *Deleted

## 2012-12-03 ENCOUNTER — Ambulatory Visit (INDEPENDENT_AMBULATORY_CARE_PROVIDER_SITE_OTHER): Payer: BC Managed Care – PPO | Admitting: Cardiology

## 2012-12-03 ENCOUNTER — Telehealth: Payer: Self-pay | Admitting: Cardiology

## 2012-12-03 ENCOUNTER — Encounter: Payer: Self-pay | Admitting: Cardiology

## 2012-12-03 VITALS — BP 128/78 | HR 60 | Ht 73.0 in | Wt 225.8 lb

## 2012-12-03 DIAGNOSIS — I10 Essential (primary) hypertension: Secondary | ICD-10-CM

## 2012-12-03 DIAGNOSIS — I4891 Unspecified atrial fibrillation: Secondary | ICD-10-CM

## 2012-12-03 DIAGNOSIS — E785 Hyperlipidemia, unspecified: Secondary | ICD-10-CM

## 2012-12-03 DIAGNOSIS — R0989 Other specified symptoms and signs involving the circulatory and respiratory systems: Secondary | ICD-10-CM

## 2012-12-03 NOTE — Assessment & Plan Note (Signed)
Plan ultrasound to exclude aneurysm.

## 2012-12-03 NOTE — Assessment & Plan Note (Signed)
Blood pressure controlled.continue present medications.

## 2012-12-03 NOTE — Telephone Encounter (Signed)
New problem:  Pt states he is returning a call from this number. Pt doesnt know who was calling him. Please advise

## 2012-12-03 NOTE — Telephone Encounter (Signed)
Spoke with pt, when dr Stanford Breed saw the pt today he wanted him to get an abdominal u/s for bruit. appt scheduled for pt.

## 2012-12-03 NOTE — Assessment & Plan Note (Signed)
Patient remains in sinus. His only embolic risk factors hypertension. Continue aspirin. Continue Toprol. I will ask for his previous monitor to confirm his previous diagnosis.

## 2012-12-03 NOTE — Addendum Note (Signed)
Addended by: Lelon Perla on: 12/03/2012 12:20 PM   Modules accepted: Level of Service

## 2012-12-03 NOTE — Progress Notes (Signed)
      HPI: 67 year old male previously followed by Dr. Verl Blalock for followup of atrial fibrillation. Patient has not been seen since March of 2012. Nuclear study in February of 2011 showed an ejection fraction of 62% and normal perfusion. Echocardiogram in March of 2012 showed normal LV function, mild left atrial enlargement and mild mitral regurgitation. Patient apparently had a monitor in February of 2012 that showed documentation of atrial fibrillation. Treated with beta blocker and aspirin. Patient denies dyspnea on exertion, orthopnea, PND, pedal edema, syncope or chest pain. Approximately one month ago he was having increased palpitations and thought he may be in atrial fibrillation. He came to the office for an electrocardiogram and was in sinus. His symptoms have since resolved.   Current Outpatient Prescriptions  Medication Sig Dispense Refill  . amLODipine-benazepril (LOTREL) 10-20 MG per capsule Take 1 capsule by mouth daily.  90 capsule  3  . aspirin 325 MG tablet Take 325 mg by mouth daily.        Marland Kitchen LIALDA 1.2 G EC tablet TAKE 2 TABLETS A DAY  60 tablet  3  . LORazepam (ATIVAN) 0.5 MG tablet Take 1 tablet (0.5 mg total) by mouth as needed.  30 tablet  5  . metoprolol succinate (TOPROL-XL) 50 MG 24 hr tablet TAKE 1 TABLET (50 MG TOTAL) BY MOUTH DAILY.  90 tablet  3  . omeprazole (PRILOSEC) 40 MG capsule Take 40 mg by mouth daily.       . pravastatin (PRAVACHOL) 40 MG tablet Take 1 tablet (40 mg total) by mouth daily.  90 tablet  3   No current facility-administered medications for this visit.     Past Medical History  Diagnosis Date  . Atrial fibrillation   . Chest pain, unspecified   . Family history of ischemic heart disease   . Esophageal reflux   . Other and unspecified hyperlipidemia   . Unspecified essential hypertension   . Palpitations   . Ulcerative colitis     Past Surgical History  Procedure Laterality Date  . Knee arthroscopy      rihgt  . Elbow surgery     left  . Vasectomy      History   Social History  . Marital Status: Single    Spouse Name: N/A    Number of Children: N/A  . Years of Education: N/A   Occupational History  . Not on file.   Social History Main Topics  . Smoking status: Former Smoker -- 25 years    Types: Cigarettes, Cigars  . Smokeless tobacco: Never Used  . Alcohol Use: Yes  . Drug Use: No  . Sexual Activity: Yes   Other Topics Concern  . Not on file   Social History Narrative   Married. Education: [employer]. Exercise: Weekly.    ROS: no fevers or chills, productive cough, hemoptysis, dysphasia, odynophagia, melena, hematochezia, dysuria, hematuria, rash, seizure activity, orthopnea, PND, pedal edema, claudication. Remaining systems are negative.  Physical Exam: Well-developed well-nourished in no acute distress.  Skin is warm and dry.  HEENT is normal.  Neck is supple.  Chest is clear to auscultation with normal expansion.  Cardiovascular exam is regular rate and rhythm.  Abdominal exam nontender or distended. No masses palpated. Extremities show no edema. neuro grossly intact  ECG 10/26/2012-sinus rhythm with no ST changes.

## 2012-12-03 NOTE — Assessment & Plan Note (Signed)
Continue statin.lipids and liver monitored by primary care.

## 2012-12-03 NOTE — Patient Instructions (Signed)
Your physician wants you to follow-up in: ONE YEAR WITH DR CRENSHAW You will receive a reminder letter in the mail two months in advance. If you don't receive a letter, please call our office to schedule the follow-up appointment.  

## 2012-12-05 ENCOUNTER — Other Ambulatory Visit: Payer: Self-pay | Admitting: Emergency Medicine

## 2012-12-10 ENCOUNTER — Ambulatory Visit (HOSPITAL_COMMUNITY): Payer: BC Managed Care – PPO | Attending: Internal Medicine

## 2012-12-10 DIAGNOSIS — Z87891 Personal history of nicotine dependence: Secondary | ICD-10-CM | POA: Insufficient documentation

## 2012-12-10 DIAGNOSIS — I10 Essential (primary) hypertension: Secondary | ICD-10-CM

## 2012-12-10 DIAGNOSIS — R0989 Other specified symptoms and signs involving the circulatory and respiratory systems: Secondary | ICD-10-CM

## 2012-12-10 DIAGNOSIS — Z1389 Encounter for screening for other disorder: Secondary | ICD-10-CM | POA: Insufficient documentation

## 2012-12-10 DIAGNOSIS — E785 Hyperlipidemia, unspecified: Secondary | ICD-10-CM | POA: Insufficient documentation

## 2012-12-14 ENCOUNTER — Other Ambulatory Visit: Payer: Self-pay | Admitting: Emergency Medicine

## 2012-12-29 ENCOUNTER — Encounter: Payer: Self-pay | Admitting: Internal Medicine

## 2012-12-30 ENCOUNTER — Encounter: Payer: Self-pay | Admitting: Cardiology

## 2013-06-02 ENCOUNTER — Other Ambulatory Visit: Payer: Self-pay | Admitting: Physician Assistant

## 2013-07-02 ENCOUNTER — Other Ambulatory Visit: Payer: Self-pay | Admitting: Physician Assistant

## 2013-07-04 ENCOUNTER — Telehealth: Payer: Self-pay

## 2013-07-04 MED ORDER — AMLODIPINE BESY-BENAZEPRIL HCL 10-20 MG PO CAPS: 1.0000 | ORAL_CAPSULE | Freq: Every day | ORAL | Status: DC

## 2013-07-04 MED ORDER — METOPROLOL SUCCINATE ER 50 MG PO TB24: ORAL_TABLET | ORAL | Status: DC

## 2013-07-04 NOTE — Telephone Encounter (Signed)
I gave him a month supply.

## 2013-07-04 NOTE — Telephone Encounter (Signed)
Patient is needing to know if he could get enough blood pressure medicine to last him until his appt with Dr Everlene Farrier on 5/28.   [facility]#: [phone]   [facility] on [address]

## 2013-07-04 NOTE — Telephone Encounter (Signed)
Advised patient that he was given one month supply to last until his appt with Dr. Everlene Farrier.  He was very grateful.

## 2013-07-21 ENCOUNTER — Encounter: Payer: Self-pay | Admitting: Emergency Medicine

## 2013-07-21 ENCOUNTER — Ambulatory Visit (INDEPENDENT_AMBULATORY_CARE_PROVIDER_SITE_OTHER): Payer: BC Managed Care – PPO | Admitting: Emergency Medicine

## 2013-07-21 VITALS — BP 130/80 | HR 60 | Temp 98.2°F | Resp 20 | Ht 73.0 in | Wt 231.0 lb

## 2013-07-21 DIAGNOSIS — Z125 Encounter for screening for malignant neoplasm of prostate: Secondary | ICD-10-CM

## 2013-07-21 DIAGNOSIS — E7800 Pure hypercholesterolemia, unspecified: Secondary | ICD-10-CM

## 2013-07-21 DIAGNOSIS — I10 Essential (primary) hypertension: Secondary | ICD-10-CM

## 2013-07-21 LAB — COMPLETE METABOLIC PANEL WITH GFR
ALT: 17 U/L (ref 0–53)
AST: 16 U/L (ref 0–37)
Albumin: 4.5 g/dL (ref 3.5–5.2)
Alkaline Phosphatase: 48 U/L (ref 39–117)
BUN: 26 mg/dL — ABNORMAL HIGH (ref 6–23)
CO2: 22 mEq/L (ref 19–32)
Calcium: 9.4 mg/dL (ref 8.4–10.5)
Chloride: 104 mEq/L (ref 96–112)
Creat: 1.14 mg/dL (ref 0.50–1.35)
GFR, Est African American: 77 mL/min
GFR, Est Non African American: 67 mL/min
Glucose, Bld: 97 mg/dL (ref 70–99)
Potassium: 4.1 mEq/L (ref 3.5–5.3)
Sodium: 136 mEq/L (ref 135–145)
Total Bilirubin: 0.5 mg/dL (ref 0.2–1.2)
Total Protein: 6.8 g/dL (ref 6.0–8.3)

## 2013-07-21 LAB — CBC WITH DIFFERENTIAL/PLATELET
Basophils Absolute: 0 10*3/uL (ref 0.0–0.1)
Basophils Relative: 0 % (ref 0–1)
Eosinophils Absolute: 0 10*3/uL (ref 0.0–0.7)
Eosinophils Relative: 1 % (ref 0–5)
HCT: 37.1 % — ABNORMAL LOW (ref 39.0–52.0)
Hemoglobin: 13 g/dL (ref 13.0–17.0)
Lymphocytes Relative: 28 % (ref 12–46)
Lymphs Abs: 1.3 10*3/uL (ref 0.7–4.0)
MCH: 31.3 pg (ref 26.0–34.0)
MCHC: 35 g/dL (ref 30.0–36.0)
MCV: 89.2 fL (ref 78.0–100.0)
Monocytes Absolute: 0.4 10*3/uL (ref 0.1–1.0)
Monocytes Relative: 8 % (ref 3–12)
Neutro Abs: 3 10*3/uL (ref 1.7–7.7)
Neutrophils Relative %: 63 % (ref 43–77)
Platelets: 211 10*3/uL (ref 150–400)
RBC: 4.16 MIL/uL — ABNORMAL LOW (ref 4.22–5.81)
RDW: 13.5 % (ref 11.5–15.5)
WBC: 4.7 10*3/uL (ref 4.0–10.5)

## 2013-07-21 LAB — LIPID PANEL
Cholesterol: 183 mg/dL (ref 0–200)
HDL: 64 mg/dL (ref >39)
LDL Cholesterol: 96 mg/dL (ref 0–99)
Total CHOL/HDL Ratio: 2.9 Ratio
Triglycerides: 113 mg/dL (ref <150)
VLDL: 23 mg/dL (ref 0–40)

## 2013-07-21 MED ORDER — METOPROLOL SUCCINATE ER 50 MG PO TB24: ORAL_TABLET | ORAL | Status: DC

## 2013-07-21 MED ORDER — PRAVASTATIN SODIUM 40 MG PO TABS: 40.0000 mg | ORAL_TABLET | Freq: Every day | ORAL | Status: DC

## 2013-07-21 MED ORDER — AMLODIPINE BESY-BENAZEPRIL HCL 10-20 MG PO CAPS: ORAL_CAPSULE | ORAL | Status: DC

## 2013-07-21 MED ORDER — LORAZEPAM 0.5 MG PO TABS: 0.5000 mg | ORAL_TABLET | ORAL | Status: DC | PRN

## 2013-07-21 NOTE — Progress Notes (Signed)
   Subjective:    Patient ID: Joseph Smith, male    DOB: 10-Apr-1945, 68 y.o.   MRN: [id number] This chart was scribed for Arlyss Queen, MD by Terressa Koyanagi, ED Scribe. This patient was seen in room 23 and the patient's care was started at 9:26 AM.     HPI HPI Comments: Joseph Smith is a 68 y.o. male, with a history of HLD, HTN, GERD, A-Fib, and hyperglycemia, who presents to the [facility] for medication refill. Pt reports he is feeling well and has no complaints.   GI: Dr. Teena Irani   Review of Systems  Constitutional: Negative for fever, fatigue and unexpected weight change.  Eyes: Negative for visual disturbance.  Respiratory: Negative for cough, chest tightness and shortness of breath.   Cardiovascular: Negative for chest pain, palpitations and leg swelling.  Gastrointestinal: Negative for abdominal pain and blood in stool.  Neurological: Negative for dizziness, light-headedness and headaches.    Objective:   Physical Exam  CONSTITUTIONAL: Well developed/well nourished HEAD: Normocephalic/atraumatic EYES: EOMI/PERRL ENMT: Mucous membranes moist NECK: supple no meningeal signs SPINE:entire spine nontender CV: S1/S2 noted, no murmurs/rubs/gallops noted LUNGS: Lungs are clear to auscultation bilaterally, no apparent distress ABDOMEN: soft, nontender, no rebound or guarding GU:no cva tenderness NEURO: Pt is awake/alert, moves all extremitiesx4 EXTREMITIES: pulses normal, full ROM SKIN: warm, color normal PSYCH: no abnormalities of mood noted   Assessment & Plan:    No change in medication. Patient stable at present.  I personally performed the services described in this documentation, which was scribed in my presence. The recorded information has been reviewed and is accurate.

## 2013-07-22 LAB — PSA: PSA: 0.75 ng/mL (ref <=4.00)

## 2013-12-26 ENCOUNTER — Encounter: Payer: Self-pay | Admitting: Cardiology

## 2013-12-26 ENCOUNTER — Ambulatory Visit (INDEPENDENT_AMBULATORY_CARE_PROVIDER_SITE_OTHER): Payer: BC Managed Care – PPO | Admitting: Cardiology

## 2013-12-26 VITALS — BP 120/76 | HR 58 | Ht 73.0 in | Wt 231.1 lb

## 2013-12-26 DIAGNOSIS — I480 Paroxysmal atrial fibrillation: Secondary | ICD-10-CM

## 2013-12-26 DIAGNOSIS — I10 Essential (primary) hypertension: Secondary | ICD-10-CM | POA: Diagnosis not present

## 2013-12-26 NOTE — Assessment & Plan Note (Signed)
Continue statin. 

## 2013-12-26 NOTE — Assessment & Plan Note (Signed)
Blood pressure controlled. Continue present medications. 

## 2013-12-26 NOTE — Progress Notes (Signed)
      HPI: FU atrial fibrillation. Nuclear study in February of 2011 showed an ejection fraction of 62% and normal perfusion. Echocardiogram in March of 2012 showed normal LV function, mild left atrial enlargement and mild mitral regurgitation. Patient had a monitor in February of 2012 that showed documentation of atrial fibrillation. Treated with beta blocker and aspirin. Abdominal ultrasound October 2014 showed no aneurysm. Since he was last seen, the patient denies any dyspnea on exertion, orthopnea, PND, pedal edema, palpitations, syncope or chest pain.   Current Outpatient Prescriptions  Medication Sig Dispense Refill  . amLODipine-benazepril (LOTREL) 10-20 MG per capsule Take  one tablet daily. 90 capsule 3  . aspirin 325 MG tablet Take 325 mg by mouth daily.      Marland Kitchen LIALDA 1.2 G EC tablet TAKE 2 TABLETS A DAY 60 tablet 3  . LORazepam (ATIVAN) 0.5 MG tablet Take 1 tablet (0.5 mg total) by mouth as needed. 30 tablet 5  . metoprolol succinate (TOPROL-XL) 50 MG 24 hr tablet TAKE 1 TABLET (50 MG TOTAL) BY MOUTH DAILY. 90 tablet 3  . omeprazole (PRILOSEC) 40 MG capsule Take 40 mg by mouth daily.     . pravastatin (PRAVACHOL) 40 MG tablet Take 1 tablet (40 mg total) by mouth daily. 90 tablet 3   No current facility-administered medications for this visit.     Past Medical History  Diagnosis Date  . Atrial fibrillation   . Chest pain, unspecified   . Family history of ischemic heart disease   . Esophageal reflux   . Other and unspecified hyperlipidemia   . Unspecified essential hypertension   . Palpitations   . Ulcerative colitis     Past Surgical History  Procedure Laterality Date  . Knee arthroscopy      rihgt  . Elbow surgery      left  . Vasectomy      History   Social History  . Marital Status: Single    Spouse Name: N/A    Number of Children: N/A  . Years of Education: N/A   Occupational History  . Not on file.   Social History Main Topics  . Smoking status:  Former Smoker -- 25 years    Types: Cigarettes, Cigars  . Smokeless tobacco: Never Used  . Alcohol Use: Yes  . Drug Use: No  . Sexual Activity: Yes   Other Topics Concern  . Not on file   Social History Narrative   Married. Education: [employer]. Exercise: Weekly.    ROS: no fevers or chills, productive cough, hemoptysis, dysphasia, odynophagia, melena, hematochezia, dysuria, hematuria, rash, seizure activity, orthopnea, PND, pedal edema, claudication. Remaining systems are negative.  Physical Exam: Well-developed well-nourished in no acute distress.  Skin is warm and dry.  HEENT is normal.  Neck is supple.  Chest is clear to auscultation with normal expansion.  Cardiovascular exam is regular rate and rhythm.  Abdominal exam nontender or distended. No masses palpated. Extremities show no edema. neuro grossly intact  ECG Sinus rhythm at a rate of 59. Normal axis. No ST changes.

## 2013-12-26 NOTE — Patient Instructions (Signed)
Your physician wants you to follow-up in: 6 MONTHS WITH DR CRENSHAW You will receive a reminder letter in the mail two months in advance. If you don't receive a letter, please call our office to schedule the follow-up appointment.  

## 2013-12-26 NOTE — Assessment & Plan Note (Signed)
Patient remains in sinus rhythm. Continue Toprol. Embolic risk factors of hypertension and age greater than 51. His previous atrial fibrillation apparently occurred in the setting of a viral illness raising the possibility of hyperadrenergic contribution. He has not had documented atrial fibrillation since. We discussed anticoagulation today. He would prefer to continue aspirin at this point. If he develops recurrent atrial fibrillation in the future then we will change to a NOAC.

## 2013-12-27 ENCOUNTER — Other Ambulatory Visit: Payer: Self-pay

## 2013-12-27 MED ORDER — MESALAMINE 1.2 G PO TBEC: DELAYED_RELEASE_TABLET | ORAL | Status: DC

## 2014-01-12 ENCOUNTER — Ambulatory Visit (INDEPENDENT_AMBULATORY_CARE_PROVIDER_SITE_OTHER): Payer: BC Managed Care – PPO | Admitting: Emergency Medicine

## 2014-01-12 ENCOUNTER — Encounter: Payer: Self-pay | Admitting: Emergency Medicine

## 2014-01-12 DIAGNOSIS — E785 Hyperlipidemia, unspecified: Secondary | ICD-10-CM

## 2014-01-12 DIAGNOSIS — Z23 Encounter for immunization: Secondary | ICD-10-CM | POA: Diagnosis not present

## 2014-01-12 DIAGNOSIS — Z125 Encounter for screening for malignant neoplasm of prostate: Secondary | ICD-10-CM

## 2014-01-12 DIAGNOSIS — I10 Essential (primary) hypertension: Secondary | ICD-10-CM

## 2014-01-12 DIAGNOSIS — R21 Rash and other nonspecific skin eruption: Secondary | ICD-10-CM | POA: Diagnosis not present

## 2014-01-12 LAB — CBC WITH DIFFERENTIAL/PLATELET
Basophils Absolute: 0 K/uL (ref 0.0–0.1)
Basophils Relative: 0 % (ref 0–1)
Eosinophils Absolute: 0 K/uL (ref 0.0–0.7)
Eosinophils Relative: 0 % (ref 0–5)
HCT: 37.7 % — ABNORMAL LOW (ref 39.0–52.0)
Hemoglobin: 13 g/dL (ref 13.0–17.0)
Lymphocytes Relative: 29 % (ref 12–46)
Lymphs Abs: 1.3 K/uL (ref 0.7–4.0)
MCH: 31.6 pg (ref 26.0–34.0)
MCHC: 34.5 g/dL (ref 30.0–36.0)
MCV: 91.7 fL (ref 78.0–100.0)
MPV: 7.7 fL — ABNORMAL LOW (ref 9.4–12.4)
Monocytes Absolute: 0.4 K/uL (ref 0.1–1.0)
Monocytes Relative: 8 % (ref 3–12)
Neutro Abs: 2.9 K/uL (ref 1.7–7.7)
Neutrophils Relative %: 63 % (ref 43–77)
Platelets: 522 K/uL — ABNORMAL HIGH (ref 150–400)
RBC: 4.11 MIL/uL — ABNORMAL LOW (ref 4.22–5.81)
RDW: 13.4 % (ref 11.5–15.5)
WBC: 4.6 K/uL (ref 4.0–10.5)

## 2014-01-12 LAB — LIPID PANEL
Cholesterol: 171 mg/dL (ref 0–200)
HDL: 52 mg/dL (ref >39)
LDL Cholesterol: 90 mg/dL (ref 0–99)
Total CHOL/HDL Ratio: 3.3 Ratio
Triglycerides: 145 mg/dL (ref <150)
VLDL: 29 mg/dL (ref 0–40)

## 2014-01-12 LAB — COMPLETE METABOLIC PANEL WITH GFR
ALT: 20 U/L (ref 0–53)
AST: 20 U/L (ref 0–37)
Albumin: 4.6 g/dL (ref 3.5–5.2)
Alkaline Phosphatase: 52 U/L (ref 39–117)
BUN: 23 mg/dL (ref 6–23)
CO2: 22 mEq/L (ref 19–32)
Calcium: 9.2 mg/dL (ref 8.4–10.5)
Chloride: 104 mEq/L (ref 96–112)
Creat: 1.05 mg/dL (ref 0.50–1.35)
GFR, Est African American: 84 mL/min
GFR, Est Non African American: 73 mL/min
Glucose, Bld: 102 mg/dL — ABNORMAL HIGH (ref 70–99)
Potassium: 4.2 mEq/L (ref 3.5–5.3)
Sodium: 139 mEq/L (ref 135–145)
Total Bilirubin: 0.6 mg/dL (ref 0.2–1.2)
Total Protein: 7.1 g/dL (ref 6.0–8.3)

## 2014-01-12 LAB — POCT URINALYSIS DIPSTICK
Bilirubin, UA: NEGATIVE
Blood, UA: NEGATIVE
Glucose, UA: NEGATIVE
Ketones, UA: NEGATIVE
Leukocytes, UA: NEGATIVE
Nitrite, UA: NEGATIVE
Protein, UA: NEGATIVE
Spec Grav, UA: 1.01
Urobilinogen, UA: 0.2
pH, UA: 5.5

## 2014-01-12 LAB — POCT SKIN KOH: Skin KOH, POC: NEGATIVE

## 2014-01-12 MED ORDER — TRIAMCINOLONE 0.1 % CREAM:EUCERIN CREAM 1:1: 1.0000 "application " | TOPICAL_CREAM | Freq: Two times a day (BID) | CUTANEOUS | Status: DC | PRN

## 2014-01-12 MED ORDER — LORAZEPAM 0.5 MG PO TABS: 0.5000 mg | ORAL_TABLET | ORAL | Status: DC | PRN

## 2014-01-12 NOTE — Progress Notes (Signed)
Subjective:  This chart was scribed for Darlyne Russian, MD by Ladene Artist, ED Scribe. The patient was seen in room 21. Patient's care was started at 8:09 AM.   Patient ID: Joseph Smith, male    DOB: 04/24/1945, 68 y.o.   MRN: [id number]  No chief complaint on file.  HPI HPI Comments: Joseph Smith is a 68 y.o. male, with a h/o a-fib, HTN, hyperlipidemia, who presents to the [facility] complaining of intermittent bilateral ankle swelling first noted a few weeks ago. Pt reports ankle swelling after standing for extended periods of time. He suspects that symptoms could be related to Amlodipine. No other concerns reported during this visit.   Pt reports having 12 "hard drinks" nightly. He denies working out at a gym or walking for exercise but he does report hunting, fishing and yard work.  Pt's mother-in-law, age 65, was recently hospitalized for confusion and hallucinations. Physician's suspect that symptoms are medicine related.   Past Medical History  Diagnosis Date  . Atrial fibrillation   . Chest pain, unspecified   . Family history of ischemic heart disease   . Esophageal reflux   . Other and unspecified hyperlipidemia   . Unspecified essential hypertension   . Palpitations   . Ulcerative colitis    Current Outpatient Prescriptions on File Prior to Visit  Medication Sig Dispense Refill  . amLODipine-benazepril (LOTREL) 10-20 MG per capsule Take  one tablet daily. 90 capsule 3  . aspirin 325 MG tablet Take 325 mg by mouth daily.      Marland Kitchen LORazepam (ATIVAN) 0.5 MG tablet Take 1 tablet (0.5 mg total) by mouth as needed. 30 tablet 5  . mesalamine (LIALDA) 1.2 G EC tablet TAKE 2 TABLETS A DAY 60 tablet 0  . metoprolol succinate (TOPROL-XL) 50 MG 24 hr tablet TAKE 1 TABLET (50 MG TOTAL) BY MOUTH DAILY. 90 tablet 3  . omeprazole (PRILOSEC) 40 MG capsule Take 40 mg by mouth daily.     . pravastatin (PRAVACHOL) 40 MG tablet Take 1 tablet (40 mg total) by mouth  daily. 90 tablet 3   No current facility-administered medications on file prior to visit.   Allergies  Allergen Reactions  . Codeine Itching    REACTION: Reaction not known   Review of Systems  Constitutional: Negative for fever.  Eyes: Negative for visual disturbance.  Respiratory: Negative for shortness of breath.   Cardiovascular: Positive for palpitations and leg swelling. Negative for chest pain.  Skin: Positive for rash.  Neurological: Negative for dizziness and light-headedness.      Objective:   Physical Exam CONSTITUTIONAL: Well developed/well nourished HEAD: Normocephalic/atraumatic EYES: EOMI/PERRL ENMT: Mucous membranes moist NECK: supple no meningeal signs SPINE/BACK:entire spine nontender CV: S1/S2 noted, no murmurs/rubs/gallops noted LUNGS: Lungs are clear to auscultation bilaterally, no apparent distress ABDOMEN: soft, nontender, no rebound or guarding, bowel sounds noted throughout abdomen GU:no cva tenderness, prostate is normal size, no nodules or enlargement  NEURO: Pt is awake/alert/appropriate, moves all extremitiesx4. No facial droop.   EXTREMITIES: pulses normal/equal, full ROM SKIN: warm, color normal, inside of L leg is scaly and red, 1+ pitting edema PSYCH: no abnormalities of mood noted, alert and oriented to situation    Assessment & Plan:  Pt is doing well. His cardiologist has recommended he go on Eliquis for atrial fib. He does not want to do this and wants to stay on ASA. I advised him that he does have a high risk  of stroke being on ASA alone and that he should continue to discuss this with his cardiologist. He does have some swelling related to Amlodipine but BP is controlled on current medications so will not make a change. Flu shot was given today. Pt will do further investigation of Prevnar vaccine.   I personally performed the services described in this documentation, which was scribed in my presence. The recorded information has been  reviewed and is accurate.

## 2014-02-09 ENCOUNTER — Encounter: Payer: Self-pay | Admitting: Emergency Medicine

## 2014-02-09 ENCOUNTER — Ambulatory Visit (INDEPENDENT_AMBULATORY_CARE_PROVIDER_SITE_OTHER): Payer: BC Managed Care – PPO | Admitting: Emergency Medicine

## 2014-02-09 VITALS — BP 138/82 | HR 60 | Temp 98.3°F | Resp 16 | Ht 72.25 in | Wt 227.6 lb

## 2014-02-09 DIAGNOSIS — D75839 Thrombocytosis, unspecified: Secondary | ICD-10-CM

## 2014-02-09 DIAGNOSIS — D473 Essential (hemorrhagic) thrombocythemia: Secondary | ICD-10-CM | POA: Diagnosis not present

## 2014-02-09 LAB — CBC WITH DIFFERENTIAL/PLATELET
Basophils Absolute: 0 10*3/uL (ref 0.0–0.1)
Basophils Relative: 0 % (ref 0–1)
Eosinophils Absolute: 0 10*3/uL (ref 0.0–0.7)
Eosinophils Relative: 0 % (ref 0–5)
HCT: 35.6 % — ABNORMAL LOW (ref 39.0–52.0)
Hemoglobin: 12.8 g/dL — ABNORMAL LOW (ref 13.0–17.0)
Lymphocytes Relative: 34 % (ref 12–46)
Lymphs Abs: 1.5 10*3/uL (ref 0.7–4.0)
MCH: 31.4 pg (ref 26.0–34.0)
MCHC: 36 g/dL (ref 30.0–36.0)
MCV: 87.5 fL (ref 78.0–100.0)
MPV: 9.2 fL — ABNORMAL LOW (ref 9.4–12.4)
Monocytes Absolute: 0.3 10*3/uL (ref 0.1–1.0)
Monocytes Relative: 7 % (ref 3–12)
Neutro Abs: 2.5 10*3/uL (ref 1.7–7.7)
Neutrophils Relative %: 59 % (ref 43–77)
Platelets: 230 10*3/uL (ref 150–400)
RBC: 4.07 MIL/uL — ABNORMAL LOW (ref 4.22–5.81)
RDW: 12.8 % (ref 11.5–15.5)
WBC: 4.3 10*3/uL (ref 4.0–10.5)

## 2014-02-09 NOTE — Progress Notes (Signed)
   Subjective:    Patient ID: Joseph Smith, male    DOB: [birth year], 68 y.o.   MRN: [id number]  HPI patient not seen or examined he was here for a blood draw only. He has a recent CBC with elevated platelet count and needed repeat    Review of Systems     Objective:   Physical Exam        Assessment & Plan:  Repeat CBC done

## 2014-02-13 ENCOUNTER — Other Ambulatory Visit: Payer: Self-pay | Admitting: Radiology

## 2014-02-13 DIAGNOSIS — D649 Anemia, unspecified: Secondary | ICD-10-CM

## 2014-04-13 DIAGNOSIS — L820 Inflamed seborrheic keratosis: Secondary | ICD-10-CM | POA: Diagnosis not present

## 2014-04-13 DIAGNOSIS — D2239 Melanocytic nevi of other parts of face: Secondary | ICD-10-CM | POA: Diagnosis not present

## 2014-04-13 DIAGNOSIS — D2230 Melanocytic nevi of unspecified part of face: Secondary | ICD-10-CM | POA: Diagnosis not present

## 2014-04-13 DIAGNOSIS — L720 Epidermal cyst: Secondary | ICD-10-CM | POA: Diagnosis not present

## 2014-06-24 NOTE — Progress Notes (Signed)
      HPI: FU atrial fibrillation. Nuclear study in February of 2011 showed an ejection fraction of 62% and normal perfusion. Echocardiogram in March of 2012 showed normal LV function, mild left atrial enlargement and mild mitral regurgitation. Patient had a monitor in February of 2012 that showed documentation of atrial fibrillation. Treated with beta blocker and aspirin. He declined anticoagulation at last OV. Abdominal ultrasound October 2014 showed no aneurysm. Since he was last seen, occasional brief palpitations that are not sustained. No chest pain or dyspnea. No syncope. No history of bleeding.  Current Outpatient Prescriptions  Medication Sig Dispense Refill  . amLODipine-benazepril (LOTREL) 10-20 MG per capsule Take  one tablet daily. 90 capsule 3  . aspirin 325 MG tablet Take 325 mg by mouth daily.      Marland Kitchen LORazepam (ATIVAN) 0.5 MG tablet Take 1 tablet (0.5 mg total) by mouth as needed. 30 tablet 5  . mesalamine (LIALDA) 1.2 G EC tablet TAKE 2 TABLETS A DAY 60 tablet 0  . metoprolol succinate (TOPROL-XL) 50 MG 24 hr tablet TAKE 1 TABLET (50 MG TOTAL) BY MOUTH DAILY. 90 tablet 3  . omeprazole (PRILOSEC) 40 MG capsule Take 40 mg by mouth daily.     . pravastatin (PRAVACHOL) 40 MG tablet Take 1 tablet (40 mg total) by mouth daily. 90 tablet 3  . Triamcinolone Acetonide (TRIAMCINOLONE 0.1 % CREAM : EUCERIN) CREA Apply 1 application topically 2 (two) times daily as needed. 1 each 0   No current facility-administered medications for this visit.     Past Medical History  Diagnosis Date  . Atrial fibrillation   . Chest pain, unspecified   . Family history of ischemic heart disease   . Esophageal reflux   . Other and unspecified hyperlipidemia   . Unspecified essential hypertension   . Palpitations   . Ulcerative colitis     Past Surgical History  Procedure Laterality Date  . Knee arthroscopy      rihgt  . Elbow surgery      left  . Vasectomy      History   Social History   . Marital Status: Single    Spouse Name: N/A  . Number of Children: N/A  . Years of Education: N/A   Occupational History  . Not on file.   Social History Main Topics  . Smoking status: Former Smoker -- 25 years    Types: Cigarettes, Cigars  . Smokeless tobacco: Never Used  . Alcohol Use: Yes  . Drug Use: No  . Sexual Activity: Yes   Other Topics Concern  . Not on file   Social History Narrative   Married. Education: [employer]. Exercise: Weekly.    ROS: no fevers or chills, productive cough, hemoptysis, dysphasia, odynophagia, melena, hematochezia, dysuria, hematuria, rash, seizure activity, orthopnea, PND, pedal edema, claudication. Remaining systems are negative.  Physical Exam: Well-developed well-nourished in no acute distress.  Skin is warm and dry.  HEENT is normal.  Neck is supple.  Chest is clear to auscultation with normal expansion.  Cardiovascular exam is regular rate and rhythm.  Abdominal exam nontender or distended. No masses palpated. Extremities show no edema. neuro grossly intact  ECG sinus bradycardia at a rate of 54. No ST changes.

## 2014-06-27 ENCOUNTER — Encounter: Payer: Self-pay | Admitting: Cardiology

## 2014-06-27 ENCOUNTER — Ambulatory Visit (INDEPENDENT_AMBULATORY_CARE_PROVIDER_SITE_OTHER): Payer: BC Managed Care – PPO | Admitting: Cardiology

## 2014-06-27 VITALS — BP 118/60 | HR 54 | Ht 73.0 in | Wt 231.0 lb

## 2014-06-27 DIAGNOSIS — I10 Essential (primary) hypertension: Secondary | ICD-10-CM

## 2014-06-27 DIAGNOSIS — I480 Paroxysmal atrial fibrillation: Secondary | ICD-10-CM | POA: Diagnosis not present

## 2014-06-27 MED ORDER — APIXABAN 5 MG PO TABS: 5.0000 mg | ORAL_TABLET | Freq: Two times a day (BID) | ORAL | Status: DC

## 2014-06-27 NOTE — Assessment & Plan Note (Signed)
Blood pressure controlled. Continue present medications. 

## 2014-06-27 NOTE — Assessment & Plan Note (Signed)
Continue statin. 

## 2014-06-27 NOTE — Assessment & Plan Note (Signed)
Patient remains in sinus rhythm. Continue beta blocker. Embolic risk factors of age greater than 68 and hypertension. Discontinue aspirin. Begin apixaban 5 mg twice a day. Check renal function and hemoglobin in 4 weeks.

## 2014-06-27 NOTE — Patient Instructions (Signed)
Your physician wants you to follow-up in: [place] will receive a reminder letter in the mail two months in advance. If you don't receive a letter, please call our office to schedule the follow-up appointment.   STOP ASPIRIN  START ELIQUIS 5 MG TWICE DAILY  Your physician recommends that you return for lab work in: [place]

## 2014-07-03 ENCOUNTER — Telehealth: Payer: Self-pay | Admitting: *Deleted

## 2014-07-03 ENCOUNTER — Other Ambulatory Visit: Payer: Self-pay | Admitting: Pharmacist Clinician (PhC)/ Clinical Pharmacy Specialist

## 2014-07-03 NOTE — Telephone Encounter (Signed)
Patient called and stated that the eliquis is going to cost him $40.00 and he says that he is just not willing to pay that much. He would like to be switched to something cheaper. I made him aware that coumadin is the cheapest, but with that he has to come in and have his inr checked monthly. He also saw on the eliquis label that it could be risky to consume alcohol with this medication. He tells me that he has an alcoholic beverage daily and just does not know how to proceed from here. Please advise. Thanks, MI

## 2014-07-03 NOTE — Telephone Encounter (Signed)
Will forward to kristin, pharm md

## 2014-07-04 NOTE — Telephone Encounter (Signed)
Spoke with patient - realizes that Eliquis would probably be no more expensive than warfarin in the long run, by the time he had coumadin clinic copays.  States he will make a decision soon if he will stay on Eliquis or not.  Reviewed patient assistance program with patient - he will review information with wife.    Explained increased risk of injury with alcohol consumption and to limit to 2 drinks per day. Pt states he likes "a bottle of wine with dinner and a cocktail prior to that.  Explained that is a concern with all anticoagulants.  Pt will call next week to let us know what he wants to do.

## 2014-07-13 ENCOUNTER — Ambulatory Visit (INDEPENDENT_AMBULATORY_CARE_PROVIDER_SITE_OTHER): Payer: BLUE CROSS/BLUE SHIELD | Admitting: Emergency Medicine

## 2014-07-13 ENCOUNTER — Encounter: Payer: Self-pay | Admitting: Emergency Medicine

## 2014-07-13 VITALS — BP 126/78 | HR 61 | Temp 98.9°F | Resp 16 | Ht 73.0 in | Wt 230.0 lb

## 2014-07-13 DIAGNOSIS — K625 Hemorrhage of anus and rectum: Secondary | ICD-10-CM

## 2014-07-13 DIAGNOSIS — R21 Rash and other nonspecific skin eruption: Secondary | ICD-10-CM

## 2014-07-13 DIAGNOSIS — Z23 Encounter for immunization: Secondary | ICD-10-CM

## 2014-07-13 DIAGNOSIS — D473 Essential (hemorrhagic) thrombocythemia: Secondary | ICD-10-CM | POA: Diagnosis not present

## 2014-07-13 DIAGNOSIS — I480 Paroxysmal atrial fibrillation: Secondary | ICD-10-CM

## 2014-07-13 DIAGNOSIS — E785 Hyperlipidemia, unspecified: Secondary | ICD-10-CM | POA: Diagnosis not present

## 2014-07-13 DIAGNOSIS — D75839 Thrombocytosis, unspecified: Secondary | ICD-10-CM

## 2014-07-13 DIAGNOSIS — I10 Essential (primary) hypertension: Secondary | ICD-10-CM | POA: Diagnosis not present

## 2014-07-13 LAB — LIPID PANEL
Cholesterol: 183 mg/dL (ref 0–200)
HDL: 72 mg/dL (ref >=40)
LDL Cholesterol: 94 mg/dL (ref 0–99)
Total CHOL/HDL Ratio: 2.5 Ratio
Triglycerides: 84 mg/dL (ref <150)
VLDL: 17 mg/dL (ref 0–40)

## 2014-07-13 LAB — COMPLETE METABOLIC PANEL WITH GFR
ALT: 19 U/L (ref 0–53)
AST: 20 U/L (ref 0–37)
Albumin: 4.6 g/dL (ref 3.5–5.2)
Alkaline Phosphatase: 49 U/L (ref 39–117)
BUN: 19 mg/dL (ref 6–23)
CO2: 25 mEq/L (ref 19–32)
Calcium: 9.2 mg/dL (ref 8.4–10.5)
Chloride: 104 mEq/L (ref 96–112)
Creat: 1.06 mg/dL (ref 0.50–1.35)
GFR, Est African American: 84 mL/min
GFR, Est Non African American: 72 mL/min
Glucose, Bld: 94 mg/dL (ref 70–99)
Potassium: 4.6 mEq/L (ref 3.5–5.3)
Sodium: 138 mEq/L (ref 135–145)
Total Bilirubin: 0.5 mg/dL (ref 0.2–1.2)
Total Protein: 7.1 g/dL (ref 6.0–8.3)

## 2014-07-13 LAB — CBC WITH DIFFERENTIAL/PLATELET
Basophils Absolute: 0 10*3/uL (ref 0.0–0.1)
Basophils Relative: 0 % (ref 0–1)
Eosinophils Absolute: 0 10*3/uL (ref 0.0–0.7)
Eosinophils Relative: 1 % (ref 0–5)
HCT: 38.5 % — ABNORMAL LOW (ref 39.0–52.0)
Hemoglobin: 13.5 g/dL (ref 13.0–17.0)
Lymphocytes Relative: 29 % (ref 12–46)
Lymphs Abs: 1.3 10*3/uL (ref 0.7–4.0)
MCH: 31.9 pg (ref 26.0–34.0)
MCHC: 35.1 g/dL (ref 30.0–36.0)
MCV: 91 fL (ref 78.0–100.0)
MPV: 10 fL (ref 8.6–12.4)
Monocytes Absolute: 0.3 10*3/uL (ref 0.1–1.0)
Monocytes Relative: 7 % (ref 3–12)
Neutro Abs: 2.8 10*3/uL (ref 1.7–7.7)
Neutrophils Relative %: 63 % (ref 43–77)
Platelets: 222 10*3/uL (ref 150–400)
RBC: 4.23 MIL/uL (ref 4.22–5.81)
RDW: 13.5 % (ref 11.5–15.5)
WBC: 4.4 10*3/uL (ref 4.0–10.5)

## 2014-07-13 MED ORDER — METOPROLOL SUCCINATE ER 50 MG PO TB24: ORAL_TABLET | ORAL | Status: DC

## 2014-07-13 MED ORDER — PRAVASTATIN SODIUM 40 MG PO TABS
40.0000 mg | ORAL_TABLET | Freq: Every day | ORAL | Status: DC
Start: 2014-07-13 — End: 2015-01-09

## 2014-07-13 MED ORDER — LORAZEPAM 0.5 MG PO TABS: 0.5000 mg | ORAL_TABLET | ORAL | Status: DC | PRN

## 2014-07-13 MED ORDER — TRIAMCINOLONE 0.1 % CREAM:EUCERIN CREAM 1:1: 1.0000 "application " | TOPICAL_CREAM | Freq: Two times a day (BID) | CUTANEOUS | Status: DC | PRN

## 2014-07-13 MED ORDER — OMEPRAZOLE 40 MG PO CPDR: 40.0000 mg | DELAYED_RELEASE_CAPSULE | Freq: Every day | ORAL | Status: DC

## 2014-07-13 MED ORDER — AMLODIPINE BESY-BENAZEPRIL HCL 10-20 MG PO CAPS: ORAL_CAPSULE | ORAL | Status: DC

## 2014-07-13 NOTE — Progress Notes (Addendum)
   Subjective:  This chart was scribed for Joseph Jordan, MD by [facility], medical scribe at [facility].The patient was seen in exam room 20 and the patient's care was started at 8:14 AM.    Patient ID: Joseph Smith, male    DOB: 12/13/1945, 69 y.o.   MRN: [id number] Chief Complaint  Patient presents with  . Hypertension    6 month follow up  . Hyperlipidemia   HPI HPI Comments: Joseph Smith is a 69 y.o. male who presents to [facility] for a follow up for hypertension and hyperlipidemia. Pt has been taking his maintenance medication. He and Dr. Stanford Breed have considered switching away from Eliquis due to his lifestyle. When he cuts himself shaving it takes hours for him to stop bleeding. He denies chest pain and abdominal pain. Pt is UTD on colonoscopy. Will have Prevnar today. His daughter had a grandson two weeks ago.  Immunization History  Administered Date(s) Administered  . Influenza Split 02/04/2011, 11/30/2011  . Influenza,inj,Quad PF,36+ Mos 01/12/2014  . Td 12/21/2007   Review of Systems  Cardiovascular: Negative for chest pain and leg swelling.  Gastrointestinal: Negative for abdominal pain.      Objective:  BP 126/78 mmHg  Pulse 61  Temp(Src) 98.9 F (37.2 C) (Oral)  Resp 16  Ht 6' 1"  (1.854 m)  Wt 230 lb (104.327 kg)  BMI 30.35 kg/m2  SpO2 98% Physical Exam  Nursing note and vitals reviewed. CONSTITUTIONAL: Well developed/well nourished HEAD: Normocephalic/atraumatic EYES: EOMI/PERRL ENMT: Mucous membranes moist NECK: supple no meningeal signs SPINE/BACK:entire spine nontender CV: S1/S2 noted, no murmurs/rubs/gallops noted LUNGS: Lungs are clear to auscultation bilaterally, no apparent distress ABDOMEN: soft, nontender, no rebound or guarding, bowel sounds noted throughout abdomen GU:no cva tenderness NEURO: Pt is awake/alert/appropriate, moves all extremitiesx4.  No facial droop.   EXTREMITIES: pulses  normal/equal, full ROM SKIN: warm, color normal PSYCH: no abnormalities of mood noted, alert and oriented to situation.  BP Readings from Last 3 Encounters:  07/13/14 126/78  06/27/14 118/60  02/09/14 138/82   Lab Results  Component Value Date   CHOL 171 01/12/2014   HDL 52 01/12/2014   LDLCALC 90 01/12/2014   TRIG 145 01/12/2014   CHOLHDL 3.3 01/12/2014      Assessment & Plan:  BP at goal. No change in meds.I personally performed the services described in this documentation, which was scribed in my presence. The recorded information has been reviewed and is accurate.   Joseph Jordan, MD

## 2014-07-28 ENCOUNTER — Other Ambulatory Visit: Payer: Self-pay | Admitting: Emergency Medicine

## 2014-08-08 ENCOUNTER — Other Ambulatory Visit: Payer: Self-pay | Admitting: Emergency Medicine

## 2014-08-09 NOTE — Telephone Encounter (Signed)
Patient looking to fill Lialda didn't see anything in note from 07/13/14 please advise

## 2014-08-09 NOTE — Telephone Encounter (Signed)
Pt states he is out of his LIALDA 1.2 G EC and the pharmacy have been trying to get this refilled need to have asap Please call [phone]    [facility]

## 2014-08-30 DIAGNOSIS — I4891 Unspecified atrial fibrillation: Secondary | ICD-10-CM | POA: Diagnosis not present

## 2014-08-30 DIAGNOSIS — I480 Paroxysmal atrial fibrillation: Secondary | ICD-10-CM | POA: Diagnosis not present

## 2014-08-31 LAB — BASIC METABOLIC PANEL WITH GFR
BUN: 19 mg/dL (ref 6–23)
CO2: 25 mEq/L (ref 19–32)
Calcium: 9 mg/dL (ref 8.4–10.5)
Chloride: 105 mEq/L (ref 96–112)
Creat: 0.95 mg/dL (ref 0.50–1.35)
GFR, Est African American: >89 mL/min
GFR, Est Non African American: 82 mL/min
Glucose, Bld: 125 mg/dL — ABNORMAL HIGH (ref 70–99)
Potassium: 3.9 mEq/L (ref 3.5–5.3)
Sodium: 139 mEq/L (ref 135–145)

## 2014-08-31 LAB — CBC
HCT: 35.6 % — ABNORMAL LOW (ref 39.0–52.0)
Hemoglobin: 12.2 g/dL — ABNORMAL LOW (ref 13.0–17.0)
MCH: 31.8 pg (ref 26.0–34.0)
MCHC: 34.3 g/dL (ref 30.0–36.0)
MCV: 92.7 fL (ref 78.0–100.0)
MPV: 9.2 fL (ref 8.6–12.4)
Platelets: 222 10*3/uL (ref 150–400)
RBC: 3.84 MIL/uL — ABNORMAL LOW (ref 4.22–5.81)
RDW: 14 % (ref 11.5–15.5)
WBC: 5.3 10*3/uL (ref 4.0–10.5)

## 2014-09-11 ENCOUNTER — Other Ambulatory Visit: Payer: Self-pay | Admitting: Emergency Medicine

## 2014-09-18 ENCOUNTER — Other Ambulatory Visit: Payer: Self-pay | Admitting: Emergency Medicine

## 2014-09-29 IMAGING — CR DG CHEST 2V
2 series · 2 of 2 positions shown · non-contrast
Comparison: 04/05/2009

CLINICAL DATA: Cough, sore throat

CHEST - 2 VIEW

[PA]
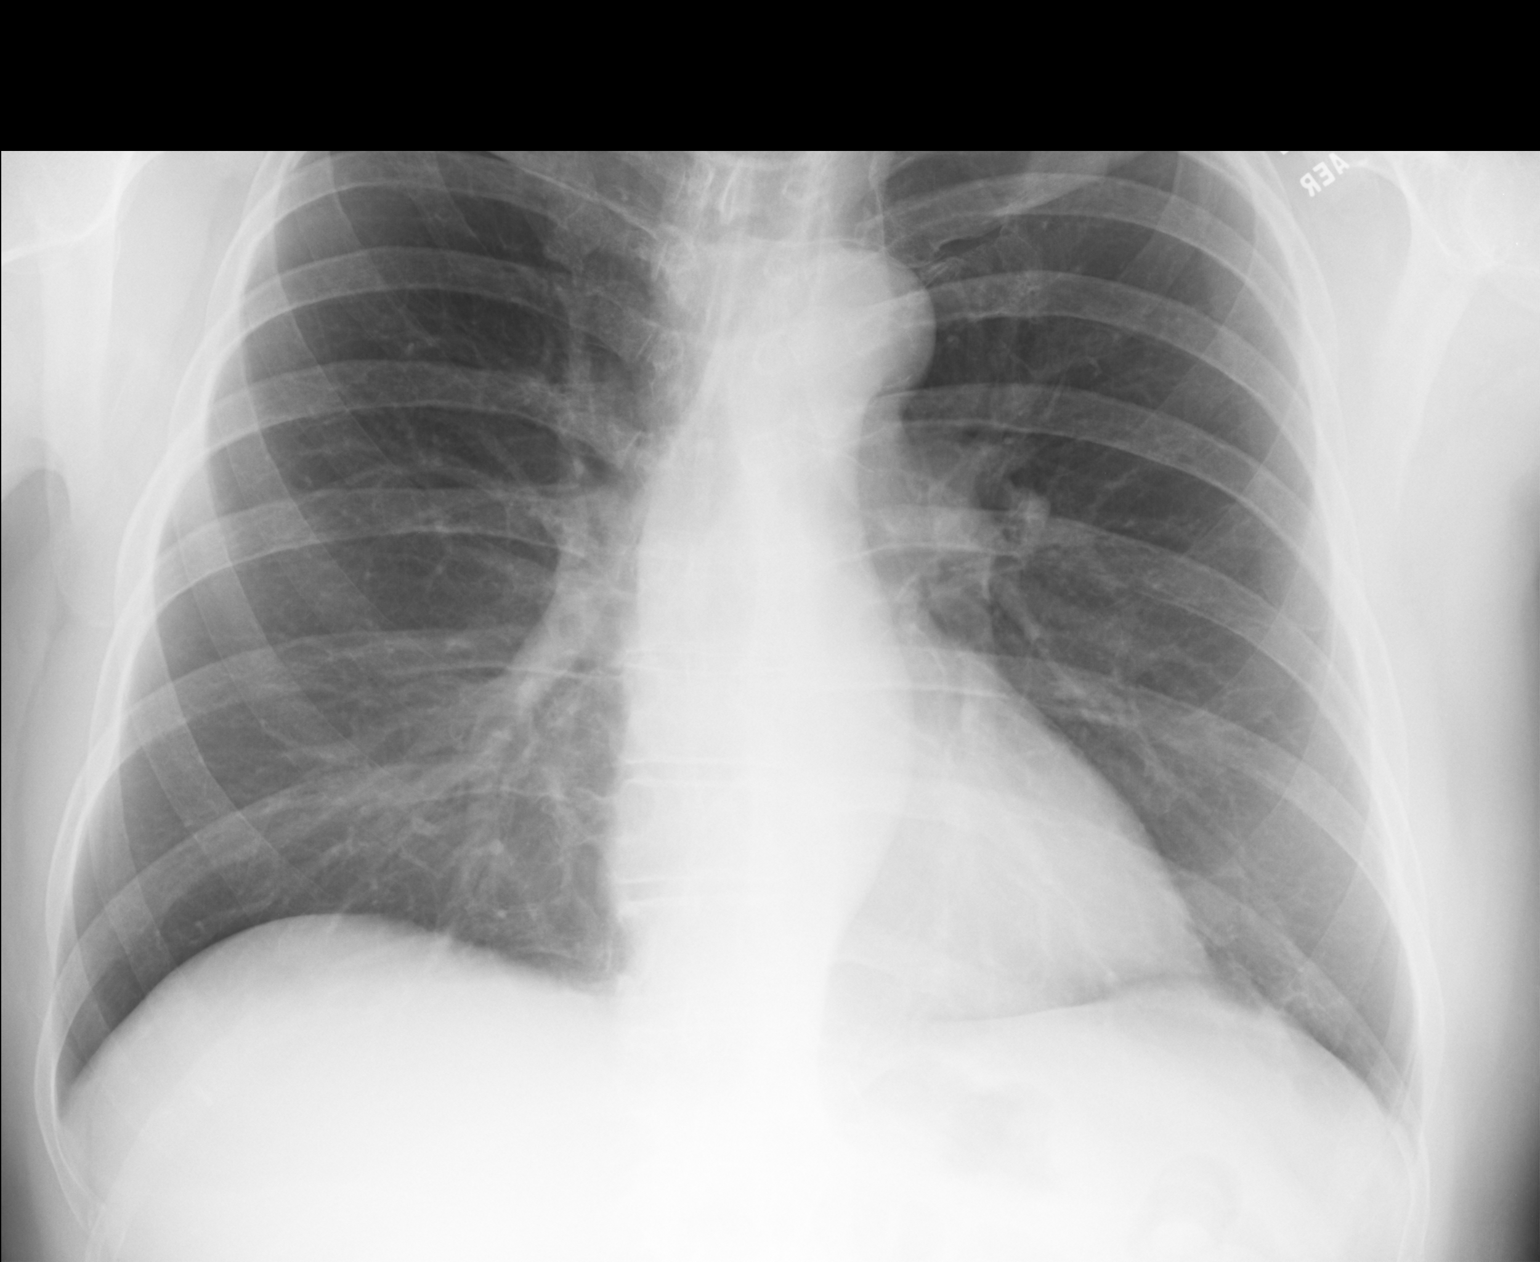

[lateral]
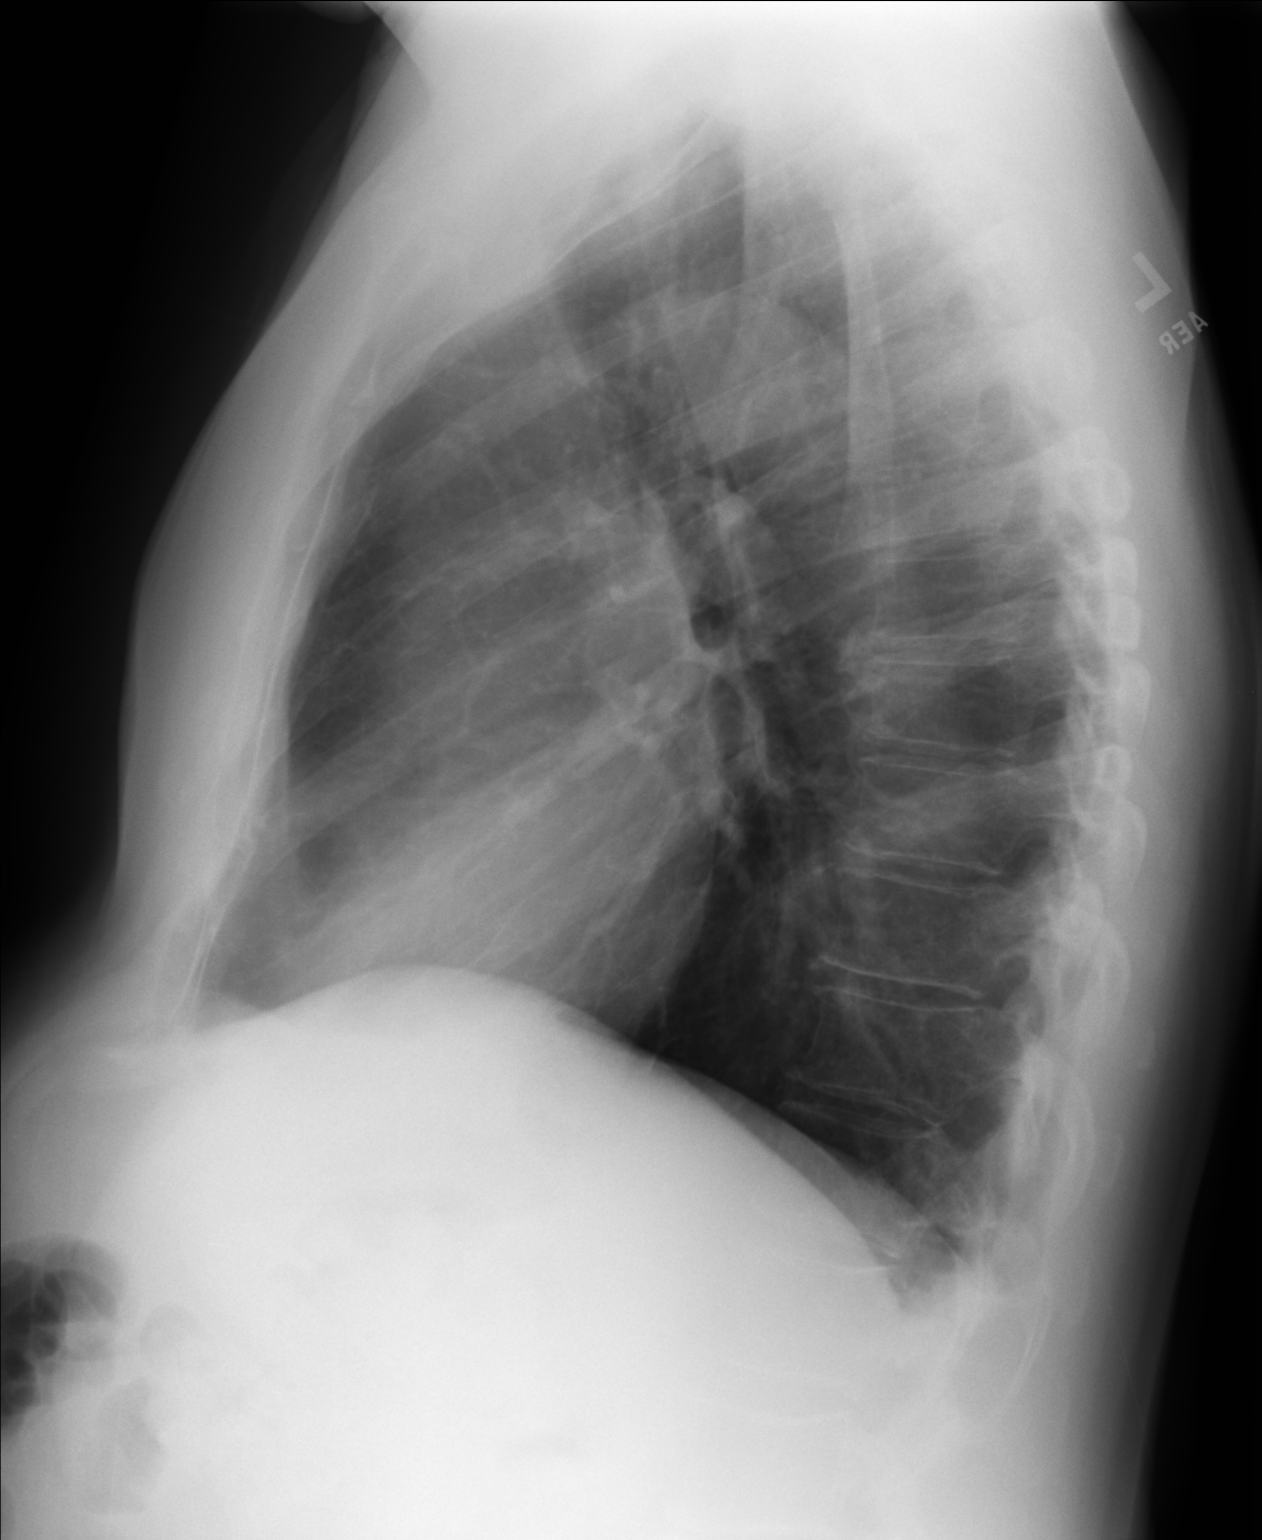

[2 of 2 positions shown; findings below may reference images not displayed]

FINDINGS: The heart and pulmonary vascularity are within normal
limits.  The lungs are clear bilaterally.  No bony abnormality is
seen.
IMPRESSION: No acute abnormality noted.

## 2014-11-28 ENCOUNTER — Encounter: Payer: Self-pay | Admitting: Emergency Medicine

## 2014-12-21 ENCOUNTER — Other Ambulatory Visit: Payer: Self-pay | Admitting: Emergency Medicine

## 2014-12-22 NOTE — Progress Notes (Addendum)
      HPI: FU atrial fibrillation. Nuclear study in February of 2011 showed an ejection fraction of 62% and normal perfusion. Echocardiogram in March of 2012 showed normal LV function, mild left atrial enlargement and mild mitral regurgitation. Patient had a monitor in February of 2012 that showed documentation of atrial fibrillation. Treated with beta blocker and apixaban. Abdominal ultrasound October 2014 showed no aneurysm. Since he was last seen, the patient denies any dyspnea on exertion, orthopnea, PND, pedal edema, palpitations, syncope or chest pain.   Current Outpatient Prescriptions  Medication Sig Dispense Refill  . amLODipine-benazepril (LOTREL) 10-20 MG per capsule Take  one tablet daily. 90 capsule 3  . apixaban (ELIQUIS) 5 MG TABS tablet Take 1 tablet (5 mg total) by mouth 2 (two) times daily. 60 tablet 6  . LIALDA 1.2 G EC tablet TAKE 2 TABLETS BY MOUTH EVERY DAY (Patient taking differently: TAKE 2 TABLETS BY MOUTH EVERY DAY AS NEEDED) 60 tablet 0  . LORazepam (ATIVAN) 0.5 MG tablet Take 1 tablet (0.5 mg total) by mouth as needed. 30 tablet 5  . metoprolol succinate (TOPROL-XL) 50 MG 24 hr tablet TAKE 1 TABLET (50 MG TOTAL) BY MOUTH DAILY. 90 tablet 3  . omeprazole (PRILOSEC) 40 MG capsule Take 1 capsule (40 mg total) by mouth daily. 30 capsule 11  . pravastatin (PRAVACHOL) 40 MG tablet Take 1 tablet (40 mg total) by mouth daily. 90 tablet 3  . Triamcinolone Acetonide (TRIAMCINOLONE 0.1 % CREAM : EUCERIN) CREA Apply 1 application topically 2 (two) times daily as needed. 1 each 0   No current facility-administered medications for this visit.     Past Medical History  Diagnosis Date  . Atrial fibrillation ([place])   . Chest pain, unspecified   . Family history of ischemic heart disease   . Esophageal reflux   . Other and unspecified hyperlipidemia   . Unspecified essential hypertension   . Palpitations   . Ulcerative colitis     Past Surgical History  Procedure  Laterality Date  . Knee arthroscopy      rihgt  . Elbow surgery      left  . Vasectomy      Social History   Social History  . Marital Status: Single    Spouse Name: N/A  . Number of Children: N/A  . Years of Education: N/A   Occupational History  . Not on file.   Social History Main Topics  . Smoking status: Former Smoker -- 25 years    Types: Cigarettes, Cigars  . Smokeless tobacco: Never Used  . Alcohol Use: 0.0 oz/week    0 Standard drinks or equivalent per week  . Drug Use: No  . Sexual Activity: Yes   Other Topics Concern  . Not on file   Social History Narrative   Married. Education: [employer]. Exercise: Weekly.    ROS: no fevers or chills, productive cough, hemoptysis, dysphasia, odynophagia, melena, hematochezia, dysuria, hematuria, rash, seizure activity, orthopnea, PND, pedal edema, claudication. Remaining systems are negative.  Physical Exam: Well-developed well-nourished in no acute distress.  Skin is warm and dry.  HEENT is normal.  Neck is supple.  Chest is clear to auscultation with normal expansion.  Cardiovascular exam is regular rate and rhythm.  Abdominal exam nontender or distended. No masses palpated. Extremities show no edema. neuro grossly intact      Electrocardiogram shows sinus rhythm with nonspecific ST changes.

## 2014-12-25 ENCOUNTER — Ambulatory Visit (INDEPENDENT_AMBULATORY_CARE_PROVIDER_SITE_OTHER): Payer: BLUE CROSS/BLUE SHIELD | Admitting: Cardiology

## 2014-12-25 ENCOUNTER — Encounter: Payer: Self-pay | Admitting: Cardiology

## 2014-12-25 VITALS — BP 122/76 | HR 57 | Ht 73.0 in | Wt 237.1 lb

## 2014-12-25 DIAGNOSIS — I480 Paroxysmal atrial fibrillation: Secondary | ICD-10-CM

## 2014-12-25 DIAGNOSIS — I10 Essential (primary) hypertension: Secondary | ICD-10-CM

## 2014-12-25 NOTE — Patient Instructions (Signed)
Medication Instructions:   NO CHANGE  Labwork:  Your physician recommends that you return for lab work in: [facility]  Follow-Up:  Your physician wants you to follow-up in: [place] will receive a reminder letter in the mail two months in advance. If you don't receive a letter, please call our office to schedule the follow-up appointment.   If you need a refill on your cardiac medications before your next appointment, please call your pharmacy.

## 2014-12-25 NOTE — Assessment & Plan Note (Signed)
Blood pressure controlled. Continue present medications. 

## 2014-12-25 NOTE — Assessment & Plan Note (Signed)
Patient remains in sinus rhythm on exam. Continue beta blocker. Embolic risk factors of age greater than 74 and hypertension. Continue apixaban 5 mg twice a day. Check renal function and hemoglobin in Dec and every six months.

## 2014-12-25 NOTE — Assessment & Plan Note (Signed)
Continue statin. 

## 2015-01-09 ENCOUNTER — Encounter: Payer: Self-pay | Admitting: Emergency Medicine

## 2015-01-09 ENCOUNTER — Ambulatory Visit (INDEPENDENT_AMBULATORY_CARE_PROVIDER_SITE_OTHER): Payer: BLUE CROSS/BLUE SHIELD | Admitting: Emergency Medicine

## 2015-01-09 VITALS — BP 136/77 | HR 58 | Temp 98.3°F | Resp 16 | Ht 73.0 in | Wt 234.0 lb

## 2015-01-09 DIAGNOSIS — E785 Hyperlipidemia, unspecified: Secondary | ICD-10-CM

## 2015-01-09 DIAGNOSIS — Z658 Other specified problems related to psychosocial circumstances: Secondary | ICD-10-CM | POA: Diagnosis not present

## 2015-01-09 DIAGNOSIS — K51011 Ulcerative (chronic) pancolitis with rectal bleeding: Secondary | ICD-10-CM

## 2015-01-09 DIAGNOSIS — I10 Essential (primary) hypertension: Secondary | ICD-10-CM | POA: Diagnosis not present

## 2015-01-09 DIAGNOSIS — I480 Paroxysmal atrial fibrillation: Secondary | ICD-10-CM

## 2015-01-09 DIAGNOSIS — F439 Reaction to severe stress, unspecified: Secondary | ICD-10-CM

## 2015-01-09 DIAGNOSIS — R21 Rash and other nonspecific skin eruption: Secondary | ICD-10-CM | POA: Diagnosis not present

## 2015-01-09 DIAGNOSIS — D473 Essential (hemorrhagic) thrombocythemia: Secondary | ICD-10-CM | POA: Diagnosis not present

## 2015-01-09 DIAGNOSIS — D75839 Thrombocytosis, unspecified: Secondary | ICD-10-CM

## 2015-01-09 DIAGNOSIS — Z23 Encounter for immunization: Secondary | ICD-10-CM

## 2015-01-09 DIAGNOSIS — K219 Gastro-esophageal reflux disease without esophagitis: Secondary | ICD-10-CM | POA: Diagnosis not present

## 2015-01-09 LAB — LIPID PANEL
Cholesterol: 188 mg/dL (ref 125–200)
HDL: 66 mg/dL (ref >=40)
LDL Cholesterol: 103 mg/dL (ref <130)
Total CHOL/HDL Ratio: 2.8 Ratio (ref <=5.0)
Triglycerides: 93 mg/dL (ref <150)
VLDL: 19 mg/dL (ref <30)

## 2015-01-09 LAB — CBC WITH DIFFERENTIAL/PLATELET
Basophils Absolute: 0 10*3/uL (ref 0.0–0.1)
Basophils Relative: 0 % (ref 0–1)
Eosinophils Absolute: 0 10*3/uL (ref 0.0–0.7)
Eosinophils Relative: 0 % (ref 0–5)
HCT: 38.5 % — ABNORMAL LOW (ref 39.0–52.0)
Hemoglobin: 13.2 g/dL (ref 13.0–17.0)
Lymphocytes Relative: 32 % (ref 12–46)
Lymphs Abs: 1.5 10*3/uL (ref 0.7–4.0)
MCH: 31.5 pg (ref 26.0–34.0)
MCHC: 34.3 g/dL (ref 30.0–36.0)
MCV: 91.9 fL (ref 78.0–100.0)
MPV: 9.7 fL (ref 8.6–12.4)
Monocytes Absolute: 0.3 10*3/uL (ref 0.1–1.0)
Monocytes Relative: 7 % (ref 3–12)
Neutro Abs: 2.8 10*3/uL (ref 1.7–7.7)
Neutrophils Relative %: 61 % (ref 43–77)
Platelets: 228 10*3/uL (ref 150–400)
RBC: 4.19 MIL/uL — ABNORMAL LOW (ref 4.22–5.81)
RDW: 13.1 % (ref 11.5–15.5)
WBC: 4.6 10*3/uL (ref 4.0–10.5)

## 2015-01-09 LAB — COMPLETE METABOLIC PANEL WITH GFR
ALT: 21 U/L (ref 9–46)
AST: 22 U/L (ref 10–35)
Albumin: 4.6 g/dL (ref 3.6–5.1)
Alkaline Phosphatase: 50 U/L (ref 40–115)
BUN: 18 mg/dL (ref 7–25)
CO2: 22 mmol/L (ref 20–31)
Calcium: 9.2 mg/dL (ref 8.6–10.3)
Chloride: 102 mmol/L (ref 98–110)
Creat: 1.12 mg/dL (ref 0.70–1.25)
GFR, Est African American: 78 mL/min (ref >=60)
GFR, Est Non African American: 67 mL/min (ref >=60)
Glucose, Bld: 97 mg/dL (ref 65–99)
Potassium: 4.1 mmol/L (ref 3.5–5.3)
Sodium: 137 mmol/L (ref 135–146)
Total Bilirubin: 0.7 mg/dL (ref 0.2–1.2)
Total Protein: 7 g/dL (ref 6.1–8.1)

## 2015-01-09 MED ORDER — METOPROLOL SUCCINATE ER 50 MG PO TB24: ORAL_TABLET | ORAL | Status: DC

## 2015-01-09 MED ORDER — MESALAMINE 1.2 G PO TBEC: 2.4000 g | DELAYED_RELEASE_TABLET | Freq: Every day | ORAL | Status: DC

## 2015-01-09 MED ORDER — PRAVASTATIN SODIUM 40 MG PO TABS
40.0000 mg | ORAL_TABLET | Freq: Every day | ORAL | Status: DC
Start: 2015-01-09 — End: 2015-07-19

## 2015-01-09 MED ORDER — APIXABAN 5 MG PO TABS: 5.0000 mg | ORAL_TABLET | Freq: Two times a day (BID) | ORAL | Status: DC

## 2015-01-09 MED ORDER — TRIAMCINOLONE 0.1 % CREAM:EUCERIN CREAM 1:1: 1.0000 "application " | TOPICAL_CREAM | Freq: Two times a day (BID) | CUTANEOUS | Status: DC | PRN

## 2015-01-09 MED ORDER — OMEPRAZOLE 40 MG PO CPDR: 40.0000 mg | DELAYED_RELEASE_CAPSULE | Freq: Every day | ORAL | Status: DC

## 2015-01-09 MED ORDER — AMLODIPINE BESY-BENAZEPRIL HCL 10-20 MG PO CAPS: ORAL_CAPSULE | ORAL | Status: DC

## 2015-01-09 MED ORDER — LORAZEPAM 0.5 MG PO TABS: 0.5000 mg | ORAL_TABLET | ORAL | Status: DC | PRN

## 2015-01-09 NOTE — Progress Notes (Signed)
This chart was scribed for Arlyss Queen, MD by Moises Blood, Medical Scribe. This patient was seen in Room 21 and the patient's care was started 8:01 AM.  Chief Complaint:  Chief Complaint  Patient presents with  . Follow-up  . Hypertension    HPI: Joseph Smith is a 69 y.o. male who reports to [facility] today for follow up HTN, a fib.  He is generally feeling well. He denies any ongoing complaints.   Past Medical History  Diagnosis Date  . Atrial fibrillation ([address])   . Chest pain, unspecified   . Family history of ischemic heart disease   . Esophageal reflux   . Other and unspecified hyperlipidemia   . Unspecified essential hypertension   . Palpitations   . Ulcerative colitis    Past Surgical History  Procedure Laterality Date  . Knee arthroscopy      rihgt  . Elbow surgery      left  . Vasectomy     Social History   Social History  . Marital Status: Single    Spouse Name: N/A  . Number of Children: N/A  . Years of Education: N/A   Social History Main Topics  . Smoking status: Former Smoker -- 25 years    Types: Cigarettes, Cigars  . Smokeless tobacco: Never Used  . Alcohol Use: 0.0 oz/week    0 Standard drinks or equivalent per week  . Drug Use: No  . Sexual Activity: Yes   Other Topics Concern  . None   Social History Narrative   Married. Education: [employer]. Exercise: Weekly.   Family History  Problem Relation Age of Onset  . Coronary artery disease      family history  . Stroke Brother    Allergies  Allergen Reactions  . Codeine Itching    REACTION: Reaction not known   Prior to Admission medications   Medication Sig Start Date End Date Taking? Authorizing Provider  amLODipine-benazepril (LOTREL) 10-20 MG per capsule Take  one tablet daily. 07/13/14  Yes Darlyne Russian, MD  apixaban (ELIQUIS) 5 MG TABS tablet Take 1 tablet (5 mg total) by mouth 2 (two) times daily. 06/27/14  Yes Lelon Perla, MD  LIALDA 1.2 G EC tablet TAKE 2 TABLETS BY  MOUTH EVERY DAY Patient taking differently: TAKE 2 TABLETS BY MOUTH EVERY DAY AS NEEDED 12/22/14  Yes Darlyne Russian, MD  LORazepam (ATIVAN) 0.5 MG tablet Take 1 tablet (0.5 mg total) by mouth as needed. 07/13/14  Yes Darlyne Russian, MD  metoprolol succinate (TOPROL-XL) 50 MG 24 hr tablet TAKE 1 TABLET (50 MG TOTAL) BY MOUTH DAILY. 07/13/14  Yes Darlyne Russian, MD  omeprazole (PRILOSEC) 40 MG capsule Take 1 capsule (40 mg total) by mouth daily. 07/13/14  Yes Darlyne Russian, MD  pravastatin (PRAVACHOL) 40 MG tablet Take 1 tablet (40 mg total) by mouth daily. 07/13/14  Yes Darlyne Russian, MD  Triamcinolone Acetonide (TRIAMCINOLONE 0.1 % CREAM : EUCERIN) CREA Apply 1 application topically 2 (two) times daily as needed. 07/13/14  Yes Darlyne Russian, MD     ROS:  Constitutional: negative for chills, fever, night sweats, weight changes, or fatigue  HEENT: negative for vision changes, hearing loss, congestion, rhinorrhea, ST, epistaxis, or sinus pressure Cardiovascular: negative for chest pain or palpitations Respiratory: negative for hemoptysis, wheezing, shortness of breath, or cough Abdominal: negative for abdominal pain, nausea, vomiting, diarrhea, or constipation Dermatological: negative for rash Neurologic: negative for headache, dizziness,  or syncope All other systems reviewed and are otherwise negative with the exception to those above and in the HPI.  PHYSICAL EXAM: Filed Vitals:   01/09/15 0759  BP: 136/77  Pulse: 58  Temp: 98.3 F (36.8 C)  Resp: 16   Body mass index is 30.88 kg/(m^2).   General: Alert, no acute distress HEENT:  Normocephalic, atraumatic, oropharynx patent. Eye: Juliette Mangle [facility] Cardiovascular:  Regular rate and rhythm, no rubs murmurs or gallops.  No Carotid bruits, radial pulse intact. No pedal edema.  Respiratory: Clear to auscultation bilaterally.  No wheezes, rales, or rhonchi.  No cyanosis, no use of accessory musculature Abdominal: No organomegaly, abdomen is soft  and non-tender, positive bowel sounds. No masses. Musculoskeletal: Gait intact. No edema, tenderness Skin: No rashes. Neurologic: Facial musculature symmetric. Psychiatric: Patient acts appropriately throughout our interaction.  Lymphatic: No cervical or submandibular lymphadenopathy Genitourinary/Anorectal: No acute findings   LABS:    EKG/XRAY:   Primary read interpreted by Dr. Everlene Farrier at [facility].   ASSESSMENT/PLAN: Patient is doing well. No change in medications. Physical examination in 6 months.I personally performed the services described in this documentation, which was scribed in my presence. The recorded information has been reviewed and is accurate.  By signing my name below, I, Moises Blood, attest that this documentation has been prepared under the direction and in the presence of Arlyss Queen, MD. Electronically Signed: Moises Blood, [place]. 01/09/2015 , 8:16 AM .    Gross sideeffects, risk and benefits, and alternatives of medications d/w patient. Patient is aware that all medications have potential sideeffects and we are unable to predict every sideeffect or drug-drug interaction that may occur.  Arlyss Queen MD 01/09/2015 8:16 AM

## 2015-01-29 LAB — CBC
HCT: 36.8 % — ABNORMAL LOW (ref 39.0–52.0)
Hemoglobin: 12.5 g/dL — ABNORMAL LOW (ref 13.0–17.0)
MCH: 31.3 pg (ref 26.0–34.0)
MCHC: 34 g/dL (ref 30.0–36.0)
MCV: 92 fL (ref 78.0–100.0)
MPV: 9.9 fL (ref 8.6–12.4)
Platelets: 244 10*3/uL (ref 150–400)
RBC: 4 MIL/uL — ABNORMAL LOW (ref 4.22–5.81)
RDW: 13.3 % (ref 11.5–15.5)
WBC: 4.8 10*3/uL (ref 4.0–10.5)

## 2015-01-30 LAB — BASIC METABOLIC PANEL
BUN: 16 mg/dL (ref 7–25)
CO2: 25 mmol/L (ref 20–31)
Calcium: 8.7 mg/dL (ref 8.6–10.3)
Chloride: 101 mmol/L (ref 98–110)
Creat: 1.08 mg/dL (ref 0.70–1.25)
Glucose, Bld: 90 mg/dL (ref 65–99)
Potassium: 3.9 mmol/L (ref 3.5–5.3)
Sodium: 135 mmol/L (ref 135–146)

## 2015-03-11 ENCOUNTER — Telehealth: Payer: Self-pay | Admitting: Family Medicine

## 2015-03-11 NOTE — Telephone Encounter (Signed)
lmom to call and reschuled appt with [facility]

## 2015-04-09 ENCOUNTER — Other Ambulatory Visit: Payer: Self-pay | Admitting: Emergency Medicine

## 2015-04-10 ENCOUNTER — Telehealth: Payer: Self-pay | Admitting: Urgent Care

## 2015-04-10 NOTE — Telephone Encounter (Signed)
Pharmacy called wanting clarification for his steroid cream. I advised them to use triamcinolone eucerin in 1:1 mixture.

## 2015-07-10 ENCOUNTER — Ambulatory Visit: Payer: Medicare Other | Admitting: Emergency Medicine

## 2015-07-12 ENCOUNTER — Ambulatory Visit: Payer: Medicare Other | Admitting: Emergency Medicine

## 2015-07-19 ENCOUNTER — Encounter: Payer: Self-pay | Admitting: Emergency Medicine

## 2015-07-19 ENCOUNTER — Ambulatory Visit (INDEPENDENT_AMBULATORY_CARE_PROVIDER_SITE_OTHER): Payer: BLUE CROSS/BLUE SHIELD | Admitting: Emergency Medicine

## 2015-07-19 VITALS — BP 120/86 | HR 55 | Temp 98.2°F | Resp 16 | Ht 73.0 in | Wt 235.6 lb

## 2015-07-19 DIAGNOSIS — D473 Essential (hemorrhagic) thrombocythemia: Secondary | ICD-10-CM

## 2015-07-19 DIAGNOSIS — Z658 Other specified problems related to psychosocial circumstances: Secondary | ICD-10-CM | POA: Diagnosis not present

## 2015-07-19 DIAGNOSIS — K219 Gastro-esophageal reflux disease without esophagitis: Secondary | ICD-10-CM

## 2015-07-19 DIAGNOSIS — R21 Rash and other nonspecific skin eruption: Secondary | ICD-10-CM | POA: Diagnosis not present

## 2015-07-19 DIAGNOSIS — F439 Reaction to severe stress, unspecified: Secondary | ICD-10-CM

## 2015-07-19 DIAGNOSIS — I480 Paroxysmal atrial fibrillation: Secondary | ICD-10-CM

## 2015-07-19 DIAGNOSIS — Z125 Encounter for screening for malignant neoplasm of prostate: Secondary | ICD-10-CM | POA: Diagnosis not present

## 2015-07-19 DIAGNOSIS — E785 Hyperlipidemia, unspecified: Secondary | ICD-10-CM

## 2015-07-19 DIAGNOSIS — K51011 Ulcerative (chronic) pancolitis with rectal bleeding: Secondary | ICD-10-CM

## 2015-07-19 DIAGNOSIS — D75839 Thrombocytosis, unspecified: Secondary | ICD-10-CM

## 2015-07-19 DIAGNOSIS — Z1159 Encounter for screening for other viral diseases: Secondary | ICD-10-CM

## 2015-07-19 DIAGNOSIS — R5383 Other fatigue: Secondary | ICD-10-CM | POA: Diagnosis not present

## 2015-07-19 DIAGNOSIS — Z23 Encounter for immunization: Secondary | ICD-10-CM

## 2015-07-19 DIAGNOSIS — I10 Essential (primary) hypertension: Secondary | ICD-10-CM

## 2015-07-19 LAB — COMPLETE METABOLIC PANEL WITH GFR
ALT: 19 U/L (ref 9–46)
AST: 19 U/L (ref 10–35)
Albumin: 4.5 g/dL (ref 3.6–5.1)
Alkaline Phosphatase: 59 U/L (ref 40–115)
BUN: 17 mg/dL (ref 7–25)
CO2: 20 mmol/L (ref 20–31)
Calcium: 9 mg/dL (ref 8.6–10.3)
Chloride: 104 mmol/L (ref 98–110)
Creat: 1.03 mg/dL (ref 0.70–1.25)
GFR, Est African American: 86 mL/min (ref >=60)
GFR, Est Non African American: 74 mL/min (ref >=60)
Glucose, Bld: 97 mg/dL (ref 65–99)
Potassium: 3.8 mmol/L (ref 3.5–5.3)
Sodium: 139 mmol/L (ref 135–146)
Total Bilirubin: 0.5 mg/dL (ref 0.2–1.2)
Total Protein: 7.1 g/dL (ref 6.1–8.1)

## 2015-07-19 LAB — FERRITIN: Ferritin: 26 ng/mL (ref 20–380)

## 2015-07-19 LAB — LIPID PANEL
Cholesterol: 190 mg/dL (ref 125–200)
HDL: 64 mg/dL (ref >=40)
LDL Cholesterol: 87 mg/dL (ref <130)
Total CHOL/HDL Ratio: 3 Ratio (ref <=5.0)
Triglycerides: 195 mg/dL — ABNORMAL HIGH (ref <150)
VLDL: 39 mg/dL — ABNORMAL HIGH (ref <30)

## 2015-07-19 LAB — MAGNESIUM: Magnesium: 2 mg/dL (ref 1.5–2.5)

## 2015-07-19 LAB — VITAMIN B12: Vitamin B-12: 486 pg/mL (ref 200–1100)

## 2015-07-19 MED ORDER — AMLODIPINE BESY-BENAZEPRIL HCL 10-20 MG PO CAPS: ORAL_CAPSULE | ORAL | Status: DC

## 2015-07-19 MED ORDER — PRAVASTATIN SODIUM 40 MG PO TABS: 40.0000 mg | ORAL_TABLET | Freq: Every day | ORAL | Status: DC

## 2015-07-19 MED ORDER — MESALAMINE 1.2 G PO TBEC: 2.4000 g | DELAYED_RELEASE_TABLET | Freq: Every day | ORAL | Status: DC

## 2015-07-19 MED ORDER — LORAZEPAM 0.5 MG PO TABS: 0.5000 mg | ORAL_TABLET | ORAL | Status: DC | PRN

## 2015-07-19 MED ORDER — METOPROLOL SUCCINATE ER 50 MG PO TB24: ORAL_TABLET | ORAL | Status: DC

## 2015-07-19 MED ORDER — TRIAMCINOLONE 0.1 % CREAM:EUCERIN CREAM 1:1: 1.0000 "application " | TOPICAL_CREAM | Freq: Two times a day (BID) | CUTANEOUS | Status: DC | PRN

## 2015-07-19 MED ORDER — OMEPRAZOLE 40 MG PO CPDR: 40.0000 mg | DELAYED_RELEASE_CAPSULE | Freq: Every day | ORAL | Status: DC

## 2015-07-19 NOTE — Patient Instructions (Signed)
     IF you received an x-ray today, you will receive an invoice from [facility]. Please contact [facility] at [phone] with questions or concerns regarding your invoice.   IF you received labwork today, you will receive an invoice from [facility]. Please contact [facility] at [phone] with questions or concerns regarding your invoice.   Our billing staff will not be able to assist you with questions regarding bills from these companies.  You will be contacted with the lab results as soon as they are available. The fastest way to get your results is to activate your My Chart account. Instructions are located on the last page of this paperwork. If you have not heard from us regarding the results in 2 weeks, please contact this office.      

## 2015-07-19 NOTE — Progress Notes (Signed)
By signing my name below, I, Mesha Guinyard, attest that this documentation has been prepared under the direction and in the presence of Arlyss Queen, MD.  Electronically Signed: Verlee Monte, Medical Scribe. 07/19/2015. 2:47 PM.  Chief Complaint:  Chief Complaint  Patient presents with  . Follow-up  . Hypertension  . pt would like Hep C bloodwork    HPI: Joseph Smith is a 70 y.o. male with a PMHx of HLD who reports to [facility] today for a follow-up for HTN. Pt states he's doing good. Pt mentions he's in A-Fib constantly, but occasionally feels palpitations. Pt reports getting a stress test once a year. Pt reports falling out of the bed while he was dreaming. He busted his left ear after the fall and mentions it took over an hour for his ear to stop bleeding and thinks it's due to taking eliquis. Pt states he wants Hep C testing. Pt has hearing loss and is assisted with hearing aids.  Pts daughter is a Marine scientist and told him he should get a Hep C test. Pt mentions he enjoys fishing.  Past Medical History  Diagnosis Date  . Atrial fibrillation ([place])   . Chest pain, unspecified   . Family history of ischemic heart disease   . Esophageal reflux   . Other and unspecified hyperlipidemia   . Unspecified essential hypertension   . Palpitations   . Ulcerative colitis    Past Surgical History  Procedure Laterality Date  . Knee arthroscopy      rihgt  . Elbow surgery      left  . Vasectomy     Social History   Social History  . Marital Status: Single    Spouse Name: N/A  . Number of Children: N/A  . Years of Education: N/A   Social History Main Topics  . Smoking status: Former Smoker -- 25 years    Types: Cigarettes, Cigars  . Smokeless tobacco: Never Used  . Alcohol Use: 0.0 oz/week    0 Standard drinks or equivalent per week  . Drug Use: No  . Sexual Activity: Yes   Other Topics Concern  . Not on file   Social History Narrative   Married. Education: [employer]. Exercise:  Weekly.   Family History  Problem Relation Age of Onset  . Coronary artery disease      family history  . Stroke Brother    Allergies  Allergen Reactions  . Codeine Itching    REACTION: Reaction not known   Prior to Admission medications   Medication Sig Start Date End Date Taking? Authorizing Provider  amLODipine-benazepril (LOTREL) 10-20 MG capsule Take  one tablet daily. 01/09/15   Darlyne Russian, MD  apixaban (ELIQUIS) 5 MG TABS tablet Take 1 tablet (5 mg total) by mouth 2 (two) times daily. 01/09/15   Darlyne Russian, MD  LORazepam (ATIVAN) 0.5 MG tablet Take 1 tablet (0.5 mg total) by mouth as needed. 01/09/15   Darlyne Russian, MD  mesalamine (LIALDA) 1.2 G EC tablet Take 2 tablets (2.4 g total) by mouth daily. 01/09/15   Darlyne Russian, MD  metoprolol succinate (TOPROL-XL) 50 MG 24 hr tablet TAKE 1 TABLET (50 MG TOTAL) BY MOUTH DAILY. 01/09/15   Darlyne Russian, MD  omeprazole (PRILOSEC) 40 MG capsule Take 1 capsule (40 mg total) by mouth daily. 01/09/15   Darlyne Russian, MD  pravastatin (PRAVACHOL) 40 MG tablet Take 1 tablet (40 mg total) by mouth daily. 01/09/15  Darlyne Russian, MD  Triamcinolone Acetonide (TRIAMCINOLONE 0.1 % CREAM : EUCERIN) CREA Apply 1 application topically 2 (two) times daily as needed. 01/09/15   Darlyne Russian, MD  triamcinolone cream (KENALOG) 0.1 % APPLY TWICE A DAY AS NEEDED 04/10/15   Darlyne Russian, MD     ROS: The patient denies fevers, chills, night sweats, unintentional weight loss, chest pain, wheezing, dyspnea on exertion, nausea, vomiting, abdominal pain, dysuria, hematuria, melena, numbness, weakness, or tingling. Pt experiences palpitations.  All other systems have been reviewed and were otherwise negative with the exception of those mentioned in the HPI and as above.    PHYSICAL EXAM: Filed Vitals:   07/19/15 1451  BP: 120/86  Pulse: 55  Temp: 98.2 F (36.8 C)  Resp: 16   Body mass index is 31.09 kg/(m^2).   General: Alert, no acute  distress HEENT:  Normocephalic, atraumatic, oropharynx patent. Eye: [facility] Cardiovascular:  Regular rate and rhythm, no rubs murmurs or gallops.  No Carotid bruits, radial pulse intact. No pedal edema.  Respiratory: Clear to auscultation bilaterally.  No wheezes, rales, or rhonchi.  No cyanosis, no use of accessory musculature Abdominal: No organomegaly, abdomen is soft and non-tender, positive bowel sounds.  No masses. Musculoskeletal: Gait intact. No edema, tenderness Skin: No rashes. Neurologic: Facial musculature symmetric. Psychiatric: Patient acts appropriately throughout our interaction. Lymphatic: No cervical or submandibular lymphadenopathy Meds ordered this encounter  Medications  . Triamcinolone Acetonide (TRIAMCINOLONE 0.1 % CREAM : EUCERIN) CREA    Sig: Apply 1 application topically 2 (two) times daily as needed.    Dispense:  1 each    Refill:  0  . mesalamine (LIALDA) 1.2 g EC tablet    Sig: Take 2 tablets (2.4 g total) by mouth daily.    Dispense:  60 tablet    Refill:  0  . pravastatin (PRAVACHOL) 40 MG tablet    Sig: Take 1 tablet (40 mg total) by mouth daily.    Dispense:  90 tablet    Refill:  3  . metoprolol succinate (TOPROL-XL) 50 MG 24 hr tablet    Sig: TAKE 1 TABLET (50 MG TOTAL) BY MOUTH DAILY.    Dispense:  90 tablet    Refill:  3  . amLODipine-benazepril (LOTREL) 10-20 MG capsule    Sig: Take  one tablet daily.    Dispense:  90 capsule    Refill:  3  . LORazepam (ATIVAN) 0.5 MG tablet    Sig: Take 1 tablet (0.5 mg total) by mouth as needed.    Dispense:  30 tablet    Refill:  5  . omeprazole (PRILOSEC) 40 MG capsule    Sig: Take 1 capsule (40 mg total) by mouth daily.    Dispense:  30 capsule    Refill:  11   LABS:   EKG/XRAY:   Primary read interpreted by Dr. Everlene Farrier at [facility].   ASSESSMENT/PLAN: Patient looks good he does have some issues with fatigue routine labs were done. He is agreeable to try and decrease his omeprazole to every  other day and see if he can tolerate this. I did also screen him for B12 ferritin and vitamin D deficiencies. PSA was done as well as a hep C test. He was given 23 today. He received Prevnar last year.I personally performed the services described in this documentation, which was scribed in my presence. The recorded information has been reviewed and is accurate. He has previously had the shingles and a  younger age and is not interested in the shingles vaccine.   Gross sideeffects, risk and benefits, and alternatives of medications d/w patient. Patient is aware that all medications have potential sideeffects and we are unable to predict every sideeffect or drug-drug interaction that may occur.  Arlyss Queen MD 07/19/2015 2:46 PM

## 2015-07-20 LAB — CBC WITH DIFFERENTIAL/PLATELET
Basophils Absolute: 0 cells/uL (ref 0–200)
Basophils Relative: 0 %
Eosinophils Absolute: 46 cells/uL (ref 15–500)
Eosinophils Relative: 1 %
HCT: 40 % (ref 38.5–50.0)
Hemoglobin: 13.6 g/dL (ref 13.2–17.1)
Lymphocytes Relative: 29 %
Lymphs Abs: 1334 cells/uL (ref 850–3900)
MCH: 32.4 pg (ref 27.0–33.0)
MCHC: 34 g/dL (ref 32.0–36.0)
MCV: 95.2 fL (ref 80.0–100.0)
MPV: 9.5 fL (ref 7.5–12.5)
Monocytes Absolute: 276 cells/uL (ref 200–950)
Monocytes Relative: 6 %
Neutro Abs: 2944 cells/uL (ref 1500–7800)
Neutrophils Relative %: 64 %
Platelets: 238 10*3/uL (ref 140–400)
RBC: 4.2 MIL/uL (ref 4.20–5.80)
RDW: 13.9 % (ref 11.0–15.0)
WBC: 4.6 10*3/uL (ref 3.8–10.8)

## 2015-07-20 LAB — VITAMIN D 25 HYDROXY (VIT D DEFICIENCY, FRACTURES): Vit D, 25-Hydroxy: 32 ng/mL (ref 30–100)

## 2015-07-20 LAB — HEPATITIS C ANTIBODY: HCV Ab: NEGATIVE

## 2015-07-20 LAB — PSA: PSA: 0.8 ng/mL (ref <=4.00)

## 2015-10-01 ENCOUNTER — Other Ambulatory Visit: Payer: Self-pay | Admitting: Emergency Medicine

## 2015-10-01 ENCOUNTER — Telehealth: Payer: Self-pay

## 2015-10-01 DIAGNOSIS — I480 Paroxysmal atrial fibrillation: Secondary | ICD-10-CM

## 2015-10-01 MED ORDER — APIXABAN 5 MG PO TABS: 5.0000 mg | ORAL_TABLET | Freq: Two times a day (BID) | ORAL | 6 refills | Status: DC

## 2015-10-01 NOTE — Telephone Encounter (Signed)
Patient request a refill of Apixaban 5 MG. [facility] [address] [phone]. Patient is completely out of his medication and he is leaving around lunch time to go out of town.

## 2015-10-01 NOTE — Telephone Encounter (Signed)
Pt notified that rx was sent in 

## 2015-11-16 NOTE — Progress Notes (Signed)
HPI:FU atrial fibrillation. Nuclear study in February of 2011 showed an ejection fraction of 62% and normal perfusion. Echocardiogram in March of 2012 showed normal LV function, mild left atrial enlargement and mild mitral regurgitation. Patient had a monitor in February of 2012 that showed documentation of atrial fibrillation. Treated with beta blocker and apixaban. Abdominal ultrasound October 2014 showed no aneurysm. Since he was last seen, the patient denies any dyspnea on exertion, orthopnea, PND, pedal edema, palpitations, syncope or chest pain.   Current Outpatient Prescriptions  Medication Sig Dispense Refill  . amLODipine-benazepril (LOTREL) 10-20 MG capsule Take  one tablet daily. 90 capsule 3  . apixaban (ELIQUIS) 5 MG TABS tablet Take 1 tablet (5 mg total) by mouth 2 (two) times daily. 60 tablet 6  . LORazepam (ATIVAN) 0.5 MG tablet Take 1 tablet (0.5 mg total) by mouth as needed. 30 tablet 5  . mesalamine (LIALDA) 1.2 g EC tablet Take 2 tablets (2.4 g total) by mouth daily. 60 tablet 0  . metoprolol succinate (TOPROL-XL) 50 MG 24 hr tablet TAKE 1 TABLET (50 MG TOTAL) BY MOUTH DAILY. 90 tablet 3  . omeprazole (PRILOSEC) 40 MG capsule Take 1 capsule (40 mg total) by mouth daily. 30 capsule 11  . pravastatin (PRAVACHOL) 40 MG tablet Take 1 tablet (40 mg total) by mouth daily. 90 tablet 3  . Triamcinolone Acetonide (TRIAMCINOLONE 0.1 % CREAM : EUCERIN) CREA Apply 1 application topically 2 (two) times daily as needed. 1 each 0  . triamcinolone cream (KENALOG) 0.1 % APPLY TWICE A DAY AS NEEDED 453.6 g 2   No current facility-administered medications for this visit.      Past Medical History:  Diagnosis Date  . Atrial fibrillation ([place])   . Chest pain, unspecified   . Esophageal reflux   . Family history of ischemic heart disease   . Other and unspecified hyperlipidemia   . Palpitations   . Ulcerative colitis   . Unspecified essential hypertension     Past Surgical  History:  Procedure Laterality Date  . ELBOW SURGERY     left  . KNEE ARTHROSCOPY     rihgt  . VASECTOMY      Social History   Social History  . Marital status: Single    Spouse name: N/A  . Number of children: N/A  . Years of education: N/A   Occupational History  . Not on file.   Social History Main Topics  . Smoking status: Former Smoker    Years: 25.00    Types: Cigarettes, Cigars  . Smokeless tobacco: Never Used  . Alcohol use 0.0 oz/week  . Drug use: No  . Sexual activity: Yes   Other Topics Concern  . Not on file   Social History Narrative   Married. Education: [employer]. Exercise: Weekly.    Family History  Problem Relation Age of Onset  . Coronary artery disease      family history  . Stroke Brother     ROS: no fevers or chills, productive cough, hemoptysis, dysphasia, odynophagia, melena, hematochezia, dysuria, hematuria, rash, seizure activity, orthopnea, PND, pedal edema, claudication. Remaining systems are negative.  Physical Exam: Well-developed well-nourished in no acute distress.  Skin is warm and dry.  HEENT is normal.  Neck is supple.  Chest is clear to auscultation with normal expansion.  Cardiovascular exam is regular rate and rhythm.  Abdominal exam nontender or distended. No masses palpated. Extremities show no edema. neuro grossly intact  ECG-Sinus  rhythm at a rate of 57. No ST changes.  A/P  1 paroxysmal atrial fibrillation-patient remains in sinus rhythm. Continue Toprol. Continue apixaban. Check hemoglobin and renal function in November.  2 hypertension-blood pressure controlled. Continue present medications.  3 hyperlipidemia-continue statin. Check lipids and liver.  Kirk Ruths, MD

## 2015-11-19 ENCOUNTER — Encounter: Payer: Self-pay | Admitting: Cardiology

## 2015-11-19 ENCOUNTER — Ambulatory Visit (INDEPENDENT_AMBULATORY_CARE_PROVIDER_SITE_OTHER): Payer: Medicare Other | Admitting: Cardiology

## 2015-11-19 VITALS — BP 136/82 | HR 56 | Ht 73.0 in | Wt 235.2 lb

## 2015-11-19 DIAGNOSIS — E785 Hyperlipidemia, unspecified: Secondary | ICD-10-CM

## 2015-11-19 DIAGNOSIS — I10 Essential (primary) hypertension: Secondary | ICD-10-CM | POA: Diagnosis not present

## 2015-11-19 DIAGNOSIS — I4891 Unspecified atrial fibrillation: Secondary | ICD-10-CM | POA: Diagnosis not present

## 2015-11-19 NOTE — Patient Instructions (Signed)
Medication Instructions:   NO CHANGE  Labwork:  Your physician recommends that you return for lab work in: November= DO NOT EAT PRIOR TO LAB WORK  Follow-Up:  Your physician wants you to follow-up in: [place] will receive a reminder letter in the mail two months in advance. If you don't receive a letter, please call our office to schedule the follow-up appointment.   If you need a refill on your cardiac medications before your next appointment, please call your pharmacy.

## 2015-11-20 ENCOUNTER — Other Ambulatory Visit: Payer: Self-pay | Admitting: Emergency Medicine

## 2015-11-20 DIAGNOSIS — K51011 Ulcerative (chronic) pancolitis with rectal bleeding: Secondary | ICD-10-CM

## 2015-12-28 DIAGNOSIS — I4891 Unspecified atrial fibrillation: Secondary | ICD-10-CM | POA: Diagnosis not present

## 2015-12-28 DIAGNOSIS — E785 Hyperlipidemia, unspecified: Secondary | ICD-10-CM | POA: Diagnosis not present

## 2015-12-28 LAB — CBC
HCT: 36.9 % — ABNORMAL LOW (ref 38.5–50.0)
Hemoglobin: 12.6 g/dL — ABNORMAL LOW (ref 13.2–17.1)
MCH: 32.1 pg (ref 27.0–33.0)
MCHC: 34.1 g/dL (ref 32.0–36.0)
MCV: 93.9 fL (ref 80.0–100.0)
MPV: 9.7 fL (ref 7.5–12.5)
Platelets: 192 10*3/uL (ref 140–400)
RBC: 3.93 MIL/uL — ABNORMAL LOW (ref 4.20–5.80)
RDW: 13.6 % (ref 11.0–15.0)
WBC: 5 10*3/uL (ref 3.8–10.8)

## 2015-12-29 LAB — BASIC METABOLIC PANEL
BUN: 22 mg/dL (ref 7–25)
CO2: 26 mmol/L (ref 20–31)
Calcium: 9.1 mg/dL (ref 8.6–10.3)
Chloride: 104 mmol/L (ref 98–110)
Creat: 1.11 mg/dL (ref 0.70–1.25)
Glucose, Bld: 98 mg/dL (ref 65–99)
Potassium: 4.6 mmol/L (ref 3.5–5.3)
Sodium: 139 mmol/L (ref 135–146)

## 2015-12-29 LAB — LIPID PANEL
Cholesterol: 182 mg/dL (ref 125–200)
HDL: 62 mg/dL (ref >=40)
LDL Cholesterol: 99 mg/dL (ref <130)
Total CHOL/HDL Ratio: 2.9 Ratio (ref <=5.0)
Triglycerides: 103 mg/dL (ref <150)
VLDL: 21 mg/dL (ref <30)

## 2015-12-29 LAB — HEPATIC FUNCTION PANEL
ALT: 19 U/L (ref 9–46)
AST: 19 U/L (ref 10–35)
Albumin: 4.6 g/dL (ref 3.6–5.1)
Alkaline Phosphatase: 48 U/L (ref 40–115)
Bilirubin, Direct: 0.2 mg/dL (ref <=0.2)
Indirect Bilirubin: 0.6 mg/dL (ref 0.2–1.2)
Total Bilirubin: 0.8 mg/dL (ref 0.2–1.2)
Total Protein: 7.1 g/dL (ref 6.1–8.1)

## 2015-12-31 ENCOUNTER — Encounter: Payer: Self-pay | Admitting: *Deleted

## 2016-01-07 ENCOUNTER — Encounter: Payer: Self-pay | Admitting: Cardiology

## 2016-01-07 NOTE — Telephone Encounter (Signed)
This encounter was created in error - please disregard.

## 2016-01-07 NOTE — Telephone Encounter (Signed)
New message   Pt verbalized that he is calling for rn

## 2016-05-23 ENCOUNTER — Other Ambulatory Visit: Payer: Self-pay | Admitting: Emergency Medicine

## 2016-05-23 NOTE — Telephone Encounter (Signed)
Spoke with patient. Needs to establish with new PCP. "needs" a physician. Requests Dr. Janeann Forehand. Transferred to Ms. A Payne to schedule.  Meds ordered this encounter  Medications  . triamcinolone cream (KENALOG) 0.1 %    Sig: APPLY TWICE A DAY AS NEEDED    Dispense:  480 g    Refill:  1    Ingredients: TRIAMCINOLONE /[facility]

## 2016-05-25 ENCOUNTER — Other Ambulatory Visit: Payer: Self-pay | Admitting: Emergency Medicine

## 2016-05-25 DIAGNOSIS — I480 Paroxysmal atrial fibrillation: Secondary | ICD-10-CM

## 2016-05-29 ENCOUNTER — Ambulatory Visit (INDEPENDENT_AMBULATORY_CARE_PROVIDER_SITE_OTHER): Payer: BLUE CROSS/BLUE SHIELD | Admitting: Family Medicine

## 2016-05-29 ENCOUNTER — Encounter: Payer: Self-pay | Admitting: Family Medicine

## 2016-05-29 VITALS — BP 151/77 | HR 65 | Temp 98.6°F | Resp 16 | Ht 73.0 in | Wt 230.0 lb

## 2016-05-29 DIAGNOSIS — I480 Paroxysmal atrial fibrillation: Secondary | ICD-10-CM | POA: Diagnosis not present

## 2016-05-29 DIAGNOSIS — E785 Hyperlipidemia, unspecified: Secondary | ICD-10-CM

## 2016-05-29 DIAGNOSIS — K51011 Ulcerative (chronic) pancolitis with rectal bleeding: Secondary | ICD-10-CM

## 2016-05-29 DIAGNOSIS — K51911 Ulcerative colitis, unspecified with rectal bleeding: Secondary | ICD-10-CM

## 2016-05-29 DIAGNOSIS — I10 Essential (primary) hypertension: Secondary | ICD-10-CM

## 2016-05-29 DIAGNOSIS — K5190 Ulcerative colitis, unspecified, without complications: Secondary | ICD-10-CM | POA: Insufficient documentation

## 2016-05-29 DIAGNOSIS — K219 Gastro-esophageal reflux disease without esophagitis: Secondary | ICD-10-CM

## 2016-05-29 MED ORDER — OMEPRAZOLE 40 MG PO CPDR: 40.0000 mg | DELAYED_RELEASE_CAPSULE | Freq: Every day | ORAL | 1 refills | Status: DC

## 2016-05-29 MED ORDER — AMLODIPINE BESY-BENAZEPRIL HCL 10-20 MG PO CAPS: ORAL_CAPSULE | ORAL | 1 refills | Status: DC

## 2016-05-29 MED ORDER — METOPROLOL SUCCINATE ER 50 MG PO TB24: ORAL_TABLET | ORAL | 1 refills | Status: DC

## 2016-05-29 MED ORDER — PRAVASTATIN SODIUM 40 MG PO TABS: 40.0000 mg | ORAL_TABLET | Freq: Every day | ORAL | 1 refills | Status: DC

## 2016-05-29 NOTE — Progress Notes (Signed)
Subjective:  By signing my name below, I, Moises Blood, attest that this documentation has been prepared under the direction and in the presence of Merri Ray, MD. Electronically Signed: Moises Blood, [place]. 05/29/2016 , 5:18 PM .  Patient was seen in Room 21 .   Patient ID: Joseph Smith, male    DOB: Nov 21, 1945, 71 y.o.   MRN: [id number] Chief Complaint  Patient presents with  . Establish Care   HPI Joseph Smith is a 71 y.o. male Here to establish care. He is a previous patient of Dr. Perfecto Kingdom, last visit on May 25th 2017. He has multiple medical problems including paroxysmal atrial fibrillation, HTN, GERD, and hyperlipidemia.   Paroxysmal atrial fibrillation  His cardiologist is Dr. Stanford Breed, last visit in Sept 25th, 2017, for 1 year follow up. He is continued on toprol and eliquis. His BP was controlled at that visit. His father passed away from heart attack in his mid 44s.   HTN Lab Results  Component Value Date   CREATININE 1.11 12/28/2015   He is taking toprol-XL 1m QD and Lotrel 10-249mQD.   He states his BP usually runs lower than 130s/80s in a typical day. [place]   Hyperlipidemia He takes pravastatin 4063mD.   Lab Results  Component Value Date   CHOL 182 12/28/2015   HDL 62 12/28/2015   LDLCALC 99 12/28/2015   TRIG 103 12/28/2015   CHOLHDL 2.9 12/28/2015   Lab Results  Component Value Date   ALT 19 12/28/2015   AST 19 12/28/2015   ALKPHOS 48 12/28/2015   BILITOT 0.8 12/28/2015    GERD He had a normal magnesium, B-12, and vitamin D level in May 2017. He takes prilosec 61m52m. He denies history of ulcers. He informs he's had reflux since he was a child.   Ulcerative colitis He takes Lialda 1.2g 2 pills daily, followed by [facility] (Dr. HayeAmedeo Plentye was instructed to take a whole script (30 days) if it flares up. He has a rare flare, about once a year. Note reviewed from Aug 2014, rectal bleeding  at that time. He was continued on acid blocker as well as Lialda.   Stress He rarely takes ativan for stress. His last script was 30 pills, and that would last him over 4 months.   Patient Active Problem List   Diagnosis Date Noted  . Bruit 12/03/2012  . Hyperglycemia 02/05/2011  . Impotence due to erectile dysfunction 02/05/2011  . ATRIAL FIBRILLATION 05/10/2010  . PALPITATIONS 04/23/2010  . CHEST PAIN-UNSPECIFIED 04/17/2009  . HYPERLIPIDEMIA 04/11/2009  . Essential hypertension 04/11/2009  . GERD 04/11/2009   Past Medical History:  Diagnosis Date  . Atrial fibrillation (HCC)[place]. Chest pain, unspecified   . Esophageal reflux   . Family history of ischemic heart disease   . Other and unspecified hyperlipidemia   . Palpitations   . Ulcerative colitis   . Unspecified essential hypertension    Past Surgical History:  Procedure Laterality Date  . ELBOW SURGERY     left  . KNEE ARTHROSCOPY     rihgt  . VASECTOMY     Allergies  Allergen Reactions  . Codeine Itching    REACTION: Reaction not known   Prior to Admission medications   Medication Sig Start Date End Date Taking? Authorizing Provider  amLODipine-benazepril (LOTREL) 10-20 MG capsule Take  one tablet daily. 07/19/15  Yes StevDarlyne Russian  ELIQArne [place]  5 MG TABS tablet TAKE 1 TABLET (5 MG TOTAL) BY MOUTH 2 (TWO) TIMES DAILY. 05/26/16  Yes Lelon Perla, MD  LIALDA 1.2 g EC tablet TAKE 2 TABLETS BY MOUTH DAILY 11/23/15  Yes Chelle Jeffery, PA-C  LORazepam (ATIVAN) 0.5 MG tablet Take 1 tablet (0.5 mg total) by mouth as needed. 07/19/15  Yes Darlyne Russian, MD  metoprolol succinate (TOPROL-XL) 50 MG 24 hr tablet TAKE 1 TABLET (50 MG TOTAL) BY MOUTH DAILY. 07/19/15  Yes Darlyne Russian, MD  omeprazole (PRILOSEC) 40 MG capsule Take 1 capsule (40 mg total) by mouth daily. 07/19/15  Yes Darlyne Russian, MD  pravastatin (PRAVACHOL) 40 MG tablet Take 1 tablet (40 mg total) by mouth daily. 07/19/15  Yes Darlyne Russian, MD  Triamcinolone  Acetonide (TRIAMCINOLONE 0.1 % CREAM : EUCERIN) CREA Apply 1 application topically 2 (two) times daily as needed. 07/19/15  Yes Darlyne Russian, MD  triamcinolone cream (KENALOG) 0.1 % APPLY TWICE A DAY AS NEEDED 05/23/16  Yes Harrison Mons, PA-C   Social History   Social History  . Marital status: Single    Spouse name: N/A  . Number of children: N/A  . Years of education: N/A   Occupational History  . Not on file.   Social History Main Topics  . Smoking status: Former Smoker    Years: 25.00    Types: Cigarettes, Cigars  . Smokeless tobacco: Never Used  . Alcohol use 0.0 oz/week  . Drug use: No  . Sexual activity: Yes   Other Topics Concern  . Not on file   Social History Narrative   Married. Education: [employer]. Exercise: Weekly.   Review of Systems  Constitutional: Negative for fatigue and unexpected weight change.  Eyes: Negative for visual disturbance.  Respiratory: Negative for cough, chest tightness and shortness of breath.   Cardiovascular: Negative for chest pain, palpitations and leg swelling.  Gastrointestinal: Negative for abdominal pain and blood in stool.  Neurological: Negative for dizziness, light-headedness and headaches.       Objective:   Physical Exam  Constitutional: He is oriented to person, place, and time. He appears well-developed and well-nourished.  HENT:  Head: Normocephalic and atraumatic.  Eyes: EOM are normal. Pupils are equal, round, and reactive to light.  Neck: No JVD present. Carotid bruit is not present.  Cardiovascular: Normal rate, regular rhythm and normal heart sounds.   No murmur heard. Pulmonary/Chest: Effort normal and breath sounds normal. He has no rales.  Abdominal: Soft. Bowel sounds are normal. He exhibits no distension. There is no tenderness.  Musculoskeletal: He exhibits no edema.  Neurological: He is alert and oriented to person, place, and time.  Skin: Skin is warm and dry.  Psychiatric: He has a normal mood and  affect.  Vitals reviewed.   Vitals:   05/29/16 1553  BP: (!) 151/77  Pulse: 65  Resp: 16  Temp: 98.6 F (37 C)  TempSrc: Oral  SpO2: 97%  Weight: 230 lb (104.3 kg)  Height: 6' 1"  (1.854 m)      Assessment & Plan:    Joseph Smith is a 71 y.o. male Essential hypertension - Plan: amLODipine-benazepril (LOTREL) 10-20 MG capsule, metoprolol succinate (TOPROL-XL) 50 MG 24 hr tablet  - Reportedly stable home, slightly elevated today with stress from recent drive. Monitor home readings, RTC precautions if persistently elevated.  Plan on labs next visit for physical.  Paroxysmal atrial fibrillation (HCC)  -Stable, rate controlled with Toprol. Anticoagulated with  Eliquis. No new side effects of medications.  Gastroesophageal reflux disease, esophagitis presence not specified - Plan: omeprazole (PRILOSEC) 40 MG capsule  - Overall stable. Discussed option of intermittent dosing for symptoms, and handout given on trigger avoidance. If persistent daily need, continue to monitor magnesium and B12 as previously checked.  Hyperlipidemia, unspecified hyperlipidemia type - Plan: pravastatin (PRAVACHOL) 40 MG tablet  -Tolerating Pravachol, continue same dose. Plan for fasting labs at physical.  Ulcerative pancolitis with rectal bleeding (HCC) Ulcerative colitis with rectal bleeding, unspecified location (HCC)  -Overall stable, has Lialda to use as needed as recommended by gastroenterologist.  Recheck in 6 months for a physical.  Meds ordered this encounter  Medications  . amLODipine-benazepril (LOTREL) 10-20 MG capsule    Sig: Take  one tablet daily.    Dispense:  90 capsule    Refill:  1  . metoprolol succinate (TOPROL-XL) 50 MG 24 hr tablet    Sig: TAKE 1 TABLET (50 MG TOTAL) BY MOUTH DAILY.    Dispense:  90 tablet    Refill:  1  . omeprazole (PRILOSEC) 40 MG capsule    Sig: Take 1 capsule (40 mg total) by mouth daily.    Dispense:  90 capsule    Refill:  1  . pravastatin  (PRAVACHOL) 40 MG tablet    Sig: Take 1 tablet (40 mg total) by mouth daily.    Dispense:  90 tablet    Refill:  1   Patient Instructions   Plan to follow up in next 6 months for a physical.  You should have enough meds until that time.   You try to space out omeprazole to every other day if tolerated, and avoid foods/triggers below.   Food Choices for Gastroesophageal Reflux Disease, Adult When you have gastroesophageal reflux disease (GERD), the foods you eat and your eating habits are very important. Choosing the right foods can help ease the discomfort of GERD. Consider working with a diet and nutrition specialist (dietitian) to help you make healthy food choices. What general guidelines should I follow? Eating plan   Choose healthy foods low in fat, such as fruits, vegetables, whole grains, low-fat dairy products, and lean meat, fish, and poultry.  Eat frequent, small meals instead of three large meals each day. Eat your meals slowly, in a relaxed setting. Avoid bending over or lying down until 2-3 hours after eating.  Limit high-fat foods such as fatty meats or fried foods.  Limit your intake of oils, butter, and shortening to less than 8 teaspoons each day.  Avoid the following:  Foods that cause symptoms. These may be different for different people. Keep a food diary to keep track of foods that cause symptoms.  Alcohol.  Drinking large amounts of liquid with meals.  Eating meals during the 2-3 hours before bed.  Cook foods using methods other than frying. This may include baking, grilling, or broiling. Lifestyle    Maintain a healthy weight. Ask your health care provider what weight is healthy for you. If you need to lose weight, work with your health care provider to do so safely.  Exercise for at least 30 minutes on 5 or more days each week, or as told by your health care provider.  Avoid wearing clothes that fit tightly around your waist and chest.  Do not use  any products that contain nicotine or tobacco, such as cigarettes and e-cigarettes. If you need help quitting, ask your health care provider.  Sleep with the  head of your bed raised. Use a wedge under the mattress or blocks under the bed frame to raise the head of the bed. What foods are not recommended? The items listed may not be a complete list. Talk with your dietitian about what dietary choices are best for you. Grains  Pastries or quick breads with added fat. [place] toast. Vegetables  Deep fried vegetables. [place] fries. Any vegetables prepared with added fat. Any vegetables that cause symptoms. For some people this may include tomatoes and tomato products, chili peppers, onions and garlic, and horseradish. Fruits  Any fruits prepared with added fat. Any fruits that cause symptoms. For some people this may include citrus fruits, such as oranges, grapefruit, pineapple, and lemons. Meats and other protein foods  High-fat meats, such as fatty beef or pork, hot dogs, ribs, ham, sausage, salami and bacon. Fried meat or protein, including fried fish and fried chicken. Nuts and nut butters. Dairy  Whole milk and chocolate milk. Sour cream. Cream. Ice cream. Cream cheese. Milk shakes. Beverages  Coffee and tea, with or without caffeine. Carbonated beverages. Sodas. Energy drinks. Fruit juice made with acidic fruits (such as orange or grapefruit). Tomato juice. Alcoholic drinks. Fats and oils  Butter. Margarine. Shortening. Ghee. Sweets and desserts  Chocolate and cocoa. Donuts. Seasoning and other foods  Pepper. Peppermint and spearmint. Any condiments, herbs, or seasonings that cause symptoms. For some people, this may include curry, hot sauce, or vinegar-based salad dressings. Summary  When you have gastroesophageal reflux disease (GERD), food and lifestyle choices are very important to help ease the discomfort of GERD.  Eat frequent, small meals instead of three large meals each day. Eat  your meals slowly, in a relaxed setting. Avoid bending over or lying down until 2-3 hours after eating.  Limit high-fat foods such as fatty meat or fried foods. This information is not intended to replace advice given to you by your health care provider. Make sure you discuss any questions you have with your health care provider. Document Released: 02/10/2005 Document Revised: 02/12/2016 Document Reviewed: 02/12/2016 Elsevier Interactive Patient Education  2017 [employer].     IF you received an x-ray today, you will receive an invoice from [facility]. Please contact [facility] at [phone] with questions or concerns regarding your invoice.   IF you received labwork today, you will receive an invoice from [facility]. Please contact [facility] at [phone] with questions or concerns regarding your invoice.   Our billing staff will not be able to assist you with questions regarding bills from these companies.  You will be contacted with the lab results as soon as they are available. The fastest way to get your results is to activate your My Chart account. Instructions are located on the last page of this paperwork. If you have not heard from [place] regarding the results in 2 weeks, please contact this office.       I personally performed the services described in this documentation, which was scribed in my presence. The recorded information has been reviewed and considered for accuracy and completeness, addended by me as needed, and agree with information above.  Signed,   Merri Ray, MD [facility].  05/30/16 11:11 PM

## 2016-05-29 NOTE — Patient Instructions (Addendum)
Plan to follow up in next 6 months for a physical.  You should have enough meds until that time.   You try to space out omeprazole to every other day if tolerated, and avoid foods/triggers below.   Food Choices for Gastroesophageal Reflux Disease, Adult When you have gastroesophageal reflux disease (GERD), the foods you eat and your eating habits are very important. Choosing the right foods can help ease the discomfort of GERD. Consider working with a diet and nutrition specialist (dietitian) to help you make healthy food choices. What general guidelines should I follow? Eating plan   Choose healthy foods low in fat, such as fruits, vegetables, whole grains, low-fat dairy products, and lean meat, fish, and poultry.  Eat frequent, small meals instead of three large meals each day. Eat your meals slowly, in a relaxed setting. Avoid bending over or lying down until 2-3 hours after eating.  Limit high-fat foods such as fatty meats or fried foods.  Limit your intake of oils, butter, and shortening to less than 8 teaspoons each day.  Avoid the following:  Foods that cause symptoms. These may be different for different people. Keep a food diary to keep track of foods that cause symptoms.  Alcohol.  Drinking large amounts of liquid with meals.  Eating meals during the 2-3 hours before bed.  Cook foods using methods other than frying. This may include baking, grilling, or broiling. Lifestyle    Maintain a healthy weight. Ask your health care provider what weight is healthy for you. If you need to lose weight, work with your health care provider to do so safely.  Exercise for at least 30 minutes on 5 or more days each week, or as told by your health care provider.  Avoid wearing clothes that fit tightly around your waist and chest.  Do not use any products that contain nicotine or tobacco, such as cigarettes and e-cigarettes. If you need help quitting, ask your health care  provider.  Sleep with the head of your bed raised. Use a wedge under the mattress or blocks under the bed frame to raise the head of the bed. What foods are not recommended? The items listed may not be a complete list. Talk with your dietitian about what dietary choices are best for you. Grains  Pastries or quick breads with added fat. [place] toast. Vegetables  Deep fried vegetables. [place] fries. Any vegetables prepared with added fat. Any vegetables that cause symptoms. For some people this may include tomatoes and tomato products, chili peppers, onions and garlic, and horseradish. Fruits  Any fruits prepared with added fat. Any fruits that cause symptoms. For some people this may include citrus fruits, such as oranges, grapefruit, pineapple, and lemons. Meats and other protein foods  High-fat meats, such as fatty beef or pork, hot dogs, ribs, ham, sausage, salami and bacon. Fried meat or protein, including fried fish and fried chicken. Nuts and nut butters. Dairy  Whole milk and chocolate milk. Sour cream. Cream. Ice cream. Cream cheese. Milk shakes. Beverages  Coffee and tea, with or without caffeine. Carbonated beverages. Sodas. Energy drinks. Fruit juice made with acidic fruits (such as orange or grapefruit). Tomato juice. Alcoholic drinks. Fats and oils  Butter. Margarine. Shortening. Ghee. Sweets and desserts  Chocolate and cocoa. Donuts. Seasoning and other foods  Pepper. Peppermint and spearmint. Any condiments, herbs, or seasonings that cause symptoms. For some people, this may include curry, hot sauce, or vinegar-based salad dressings. Summary  When you have gastroesophageal  reflux disease (GERD), food and lifestyle choices are very important to help ease the discomfort of GERD.  Eat frequent, small meals instead of three large meals each day. Eat your meals slowly, in a relaxed setting. Avoid bending over or lying down until 2-3 hours after eating.  Limit high-fat foods such  as fatty meat or fried foods. This information is not intended to replace advice given to you by your health care provider. Make sure you discuss any questions you have with your health care provider. Document Released: 02/10/2005 Document Revised: 02/12/2016 Document Reviewed: 02/12/2016 Elsevier Interactive Patient Education  2017 [employer].     IF you received an x-ray today, you will receive an invoice from [facility]. Please contact [facility] at [phone] with questions or concerns regarding your invoice.   IF you received labwork today, you will receive an invoice from [facility]. Please contact [facility] at [phone] with questions or concerns regarding your invoice.   Our billing staff will not be able to assist you with questions regarding bills from these companies.  You will be contacted with the lab results as soon as they are available. The fastest way to get your results is to activate your My Chart account. Instructions are located on the last page of this paperwork. If you have not heard from [place] regarding the results in 2 weeks, please contact this office.

## 2016-06-05 ENCOUNTER — Telehealth: Payer: Self-pay | Admitting: General Practice

## 2016-06-05 DIAGNOSIS — F439 Reaction to severe stress, unspecified: Secondary | ICD-10-CM

## 2016-06-05 MED ORDER — LORAZEPAM 0.5 MG PO TABS: 0.5000 mg | ORAL_TABLET | ORAL | 1 refills | Status: DC | PRN

## 2016-06-05 NOTE — Telephone Encounter (Signed)
Discussed at last visit. #30, 1 refill provided. This should last until follow up in 6 months based on last discussion of typical use.

## 2016-06-05 NOTE — Telephone Encounter (Signed)
Please advise  Last OV 05/29/16 Medication prescribed by Dr. Everlene Farrier

## 2016-06-05 NOTE — Telephone Encounter (Signed)
[facility] is calling to get a refill on patient lorazepam    Best number [phone]

## 2016-06-06 ENCOUNTER — Other Ambulatory Visit: Payer: Self-pay | Admitting: Emergency Medicine

## 2016-06-06 DIAGNOSIS — F439 Reaction to severe stress, unspecified: Secondary | ICD-10-CM

## 2016-06-06 NOTE — Telephone Encounter (Signed)
Called to pharmacy 

## 2016-06-06 NOTE — Telephone Encounter (Signed)
[facility], see last refill

## 2016-09-26 ENCOUNTER — Other Ambulatory Visit: Payer: Self-pay | Admitting: Physician Assistant

## 2016-09-26 DIAGNOSIS — K51011 Ulcerative (chronic) pancolitis with rectal bleeding: Secondary | ICD-10-CM

## 2016-10-28 DIAGNOSIS — K625 Hemorrhage of anus and rectum: Secondary | ICD-10-CM | POA: Diagnosis not present

## 2016-11-05 ENCOUNTER — Ambulatory Visit (INDEPENDENT_AMBULATORY_CARE_PROVIDER_SITE_OTHER): Payer: BLUE CROSS/BLUE SHIELD | Admitting: Physician Assistant

## 2016-11-05 ENCOUNTER — Encounter: Payer: Self-pay | Admitting: Physician Assistant

## 2016-11-05 VITALS — BP 140/82 | HR 55 | Ht 73.0 in | Wt 232.0 lb

## 2016-11-05 DIAGNOSIS — I480 Paroxysmal atrial fibrillation: Secondary | ICD-10-CM

## 2016-11-05 DIAGNOSIS — E785 Hyperlipidemia, unspecified: Secondary | ICD-10-CM

## 2016-11-05 DIAGNOSIS — I10 Essential (primary) hypertension: Secondary | ICD-10-CM | POA: Diagnosis not present

## 2016-11-05 DIAGNOSIS — K921 Melena: Secondary | ICD-10-CM | POA: Diagnosis not present

## 2016-11-05 NOTE — Progress Notes (Signed)
Cardiology Office Note    Date:  11/07/2016   ID:  Joseph Smith, DOB 12/01/1945, MRN [id number]  PCP:  Wendie Agreste, MD  Cardiologist:  Dr. Stanford Breed  Chief Complaint  Patient presents with  . Follow-up    seen for Dr. Stanford Breed   Preoperative clearance requested by Dr. Penelope Coop of GI for colonoscopy scheduled on 11/11/2016. Colonoscopy is for GI bleed.  History of Present Illness:  Joseph Smith is a 71 y.o. male with PMH of ulcerative colitis, PAF, HTN and HLD, but no prior h/o CAD. He does have family history of early CAD with his father passing away in his mid 36s. He had a negative Myoview in February 2011 that showed EF 62%. Echocardiogram in March 2012 showed an normal LV function, mild LAE, mild MR. Heart monitor in February 2012 showed atrial fibrillation. He was placed on beta blocker and eliquis. Abdominal ultrasound in October 2014 showed no aneurysm. He was last seen on 11/19/2015, he was doing well at the time.  Patient has been doing well since he was last seen by Dr. Stanford Breed. He continued to be quite active along with his wife. He works in the yard quite often and has not had any significant chest discomfort or or shortness of breath. He has upcoming colonoscopy by Dr. Penelope Coop for evaluation of occasional blood in the stool. He denies significant amount of bleeding, he says it is only a speck every once in awhile. He has no problem walking 4 blocks away from his home and climb up 2 flights of stairs. He is able to complete at least 4 METS, he does not need any further workup prior to the intended procedure. He can hold eliquis for 48 hours prior to the procedure. The last day he should take eliquis with would be Saturday 9/15, he will begin holding the eliquis in the morning of 9/16 he will need to restart eliquis as soon as possible after the procedure. He will need fasting lipid panel and LFTs in November 2018.   Past Medical History:  Diagnosis Date  . Atrial  fibrillation ([place])   . Chest pain, unspecified   . Esophageal reflux   . Family history of ischemic heart disease   . Other and unspecified hyperlipidemia   . Palpitations   . Ulcerative colitis   . Unspecified essential hypertension     Past Surgical History:  Procedure Laterality Date  . ELBOW SURGERY     left  . KNEE ARTHROSCOPY     rihgt  . VASECTOMY      Current Medications: Outpatient Medications Prior to Visit  Medication Sig Dispense Refill  . amLODipine-benazepril (LOTREL) 10-20 MG capsule Take  one tablet daily. 90 capsule 1  . ELIQUIS 5 MG TABS tablet TAKE 1 TABLET (5 MG TOTAL) BY MOUTH 2 (TWO) TIMES DAILY. 60 tablet 6  . LIALDA 1.2 g EC tablet TAKE 2 TABLETS BY MOUTH DAILY 180 tablet 0  . LORazepam (ATIVAN) 0.5 MG tablet Take 1 tablet (0.5 mg total) by mouth as needed. 30 tablet 1  . metoprolol succinate (TOPROL-XL) 50 MG 24 hr tablet TAKE 1 TABLET (50 MG TOTAL) BY MOUTH DAILY. 90 tablet 1  . omeprazole (PRILOSEC) 40 MG capsule Take 1 capsule (40 mg total) by mouth daily. 90 capsule 1  . pravastatin (PRAVACHOL) 40 MG tablet Take 1 tablet (40 mg total) by mouth daily. 90 tablet 1  . Triamcinolone Acetonide (TRIAMCINOLONE 0.1 % CREAM : EUCERIN) CREA Apply  1 application topically 2 (two) times daily as needed. 1 each 0  . triamcinolone cream (KENALOG) 0.1 % APPLY TWICE A DAY AS NEEDED 480 g 1   No facility-administered medications prior to visit.      Allergies:   Codeine   Social History   Social History  . Marital status: Single    Spouse name: N/A  . Number of children: N/A  . Years of education: N/A   Social History Main Topics  . Smoking status: Former Smoker    Years: 25.00    Types: Cigarettes, Cigars  . Smokeless tobacco: Never Used  . Alcohol use 0.0 oz/week  . Drug use: No  . Sexual activity: Yes   Other Topics Concern  . None   Social History Narrative   Married. Education: [employer]. Exercise: Weekly.     Family History:  The patient's  family history includes Coronary artery disease in his unknown relative; Stroke in his brother.   ROS:   Please see the history of present illness.    ROS All other systems reviewed and are negative.   PHYSICAL EXAM:   VS:  BP 140/82   Pulse (!) 55   Ht 6' 1"  (1.854 m)   Wt 232 lb (105.2 kg)   BMI 30.61 kg/m    GEN: Well nourished, well developed, in no acute distress  HEENT: normal  Neck: no JVD, carotid bruits, or masses Cardiac: RRR; no murmurs, rubs, or gallops,no edema  Respiratory:  clear to auscultation bilaterally, normal work of breathing GI: soft, nontender, nondistended, + BS MS: no deformity or atrophy  Skin: warm and dry, no rash Neuro:  Alert and Oriented x 3, Strength and sensation are intact Psych: euthymic mood, full affect  Wt Readings from Last 3 Encounters:  11/05/16 232 lb (105.2 kg)  05/29/16 230 lb (104.3 kg)  11/19/15 235 lb 4 oz (106.7 kg)      Studies/Labs Reviewed:   EKG:  EKG is ordered today.  The ekg ordered today demonstrates Sinus bradycardia, no significant ST-T wave changes.  Recent Labs: 12/28/2015: ALT 19; BUN 22; Creat 1.11; Hemoglobin 12.6; Platelets 192; Potassium 4.6; Sodium 139   Lipid Panel    Component Value Date/Time   CHOL 182 12/28/2015 0931   TRIG 103 12/28/2015 0931   HDL 62 12/28/2015 0931   CHOLHDL 2.9 12/28/2015 0931   VLDL 21 12/28/2015 0931   LDLCALC 99 12/28/2015 0931    Additional studies/ records that were reviewed today include:   Myoview 04/09/2009 Normal perfusion.   ASSESSMENT:    1. Paroxysmal atrial fibrillation (HCC)   2. Hyperlipidemia, unspecified hyperlipidemia type   3. Essential hypertension   4. Bloody stool      PLAN:  In order of problems listed above:  1. Paroxysmal atrial fibrillation: Currently maintaining sinus rhythm, well rate controlled. Continue on eliquis  2. Bloody stool: He denies any significant amount of bleeding, however he says he continued to have specks of blood  in his stool every once in a while. He has upcoming colonoscopy. He can hold eliquis for 48 hours prior to the procedure.  3. Hypertension: Blood pressure stable  4. Hyperlipidemia: Continue Pravachol, pending fasting lipid panel and LFTs in November    Medication Adjustments/Labs and Tests Ordered: Current medicines are reviewed at length with the patient today.  Concerns regarding medicines are outlined above.  Medication changes, Labs and Tests ordered today are listed in the Patient Instructions below. Patient Instructions  Medication Instructions:  Your physician recommends that you continue on your current medications as directed. Please refer to the Current Medication list given to you today.  Labwork: Please return in November for FASTING lab work (Lipid panel, liver function).   Lab slips will be mailed to you to remind you.    Testing/Procedures: NONE  Follow-Up: Your physician wants you to follow-up in: 6 MONTHS with Dr. Stanford Breed. You will receive a reminder letter in the mail two months in advance. If you don't receive a letter, please call our office to schedule the follow-up appointment.   Any Other Special Instructions Will Be Listed Below (If Applicable).    If you need a refill on your cardiac medications before your next appointment, please call your pharmacy.      Hilbert Corrigan, [place]  11/07/2016 6:00 AM    [address], [place], [place]  [address] Phone: [phone]; Fax: [phone]

## 2016-11-05 NOTE — Patient Instructions (Signed)
Medication Instructions:  Your physician recommends that you continue on your current medications as directed. Please refer to the Current Medication list given to you today.  Labwork: Please return in November for FASTING lab work (Lipid panel, liver function).   Lab slips will be mailed to you to remind you.    Testing/Procedures: NONE  Follow-Up: Your physician wants you to follow-up in: 6 MONTHS with Dr. Stanford Breed. You will receive a reminder letter in the mail two months in advance. If you don't receive a letter, please call our office to schedule the follow-up appointment.   Any Other Special Instructions Will Be Listed Below (If Applicable).    If you need a refill on your cardiac medications before your next appointment, please call your pharmacy.

## 2016-11-06 ENCOUNTER — Telehealth: Payer: Self-pay | Admitting: Cardiology

## 2016-11-06 NOTE — Telephone Encounter (Signed)
New message       [facility] Pre-operative Risk Assessment    Request for surgical clearance:  1. What type of surgery is being performed? Colonoscopy    2. When is this surgery scheduled? 11/11/16    3. Are there any medications that need to be held prior to surgery and how long? When to hold his Eliquis  4. Name of physician performing surgery? Dr. Anson Fret    5. What is your office phone and fax number? [place] 11/06/2016, 9:41 AM  _________________________________________________________________   (provider comments below)

## 2016-11-06 NOTE — Telephone Encounter (Signed)
Clearance was faxed 9/12-to medical records to be scanned.    Called office who reviewed faxes and verified they received.

## 2016-11-07 ENCOUNTER — Encounter: Payer: Self-pay | Admitting: Physician Assistant

## 2016-11-11 DIAGNOSIS — K625 Hemorrhage of anus and rectum: Secondary | ICD-10-CM | POA: Diagnosis not present

## 2016-11-11 DIAGNOSIS — K644 Residual hemorrhoidal skin tags: Secondary | ICD-10-CM | POA: Diagnosis not present

## 2016-11-11 DIAGNOSIS — K5730 Diverticulosis of large intestine without perforation or abscess without bleeding: Secondary | ICD-10-CM | POA: Diagnosis not present

## 2016-11-11 DIAGNOSIS — K529 Noninfective gastroenteritis and colitis, unspecified: Secondary | ICD-10-CM | POA: Diagnosis not present

## 2016-11-11 LAB — HM COLONOSCOPY

## 2016-11-12 ENCOUNTER — Other Ambulatory Visit: Payer: Self-pay | Admitting: Family Medicine

## 2016-11-12 DIAGNOSIS — F439 Reaction to severe stress, unspecified: Secondary | ICD-10-CM

## 2016-11-13 DIAGNOSIS — K529 Noninfective gastroenteritis and colitis, unspecified: Secondary | ICD-10-CM | POA: Diagnosis not present

## 2016-11-21 ENCOUNTER — Other Ambulatory Visit: Payer: Self-pay | Admitting: Family Medicine

## 2016-11-21 DIAGNOSIS — K219 Gastro-esophageal reflux disease without esophagitis: Secondary | ICD-10-CM

## 2016-11-21 NOTE — Telephone Encounter (Signed)
PATIENT CALLED BECAUSE HE SAID HE REQUESTED A REFILL ON HIS LORAZEPAM 0.5 MG LAST WEEK. HE HAS CALLED HIS PHARMACY SEVERAL TIMES TO SEE IF THEY HAVE HEARD BACK FROM DR. GREENE AND THEY KEEP TELLING HIM THEY HAVE NOT. (THE REQUEST WAS SENT ON 11/12/16). 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 A REQUEST FOR HIS OMEPRAZOLE 40 WAS SENT OVER TODAY (11/21/16). BEST PHONE [phone] (CELL) PHARMACY CHOICE IS [facility] ON [address]. [place]

## 2016-11-26 NOTE — Telephone Encounter (Signed)
Refilled, can fax in once signed if needed.

## 2016-11-27 NOTE — Telephone Encounter (Signed)
Faxed rx - Lorazepam to [facility]

## 2016-12-04 ENCOUNTER — Encounter: Payer: Medicare Other | Admitting: Family Medicine

## 2016-12-08 ENCOUNTER — Encounter: Payer: Self-pay | Admitting: Family Medicine

## 2016-12-08 ENCOUNTER — Ambulatory Visit (INDEPENDENT_AMBULATORY_CARE_PROVIDER_SITE_OTHER): Payer: BLUE CROSS/BLUE SHIELD | Admitting: Family Medicine

## 2016-12-08 VITALS — BP 144/80 | HR 66 | Temp 98.4°F | Resp 16 | Ht 73.0 in | Wt 229.8 lb

## 2016-12-08 DIAGNOSIS — K51011 Ulcerative (chronic) pancolitis with rectal bleeding: Secondary | ICD-10-CM

## 2016-12-08 DIAGNOSIS — E785 Hyperlipidemia, unspecified: Secondary | ICD-10-CM

## 2016-12-08 DIAGNOSIS — I10 Essential (primary) hypertension: Secondary | ICD-10-CM | POA: Diagnosis not present

## 2016-12-08 DIAGNOSIS — F439 Reaction to severe stress, unspecified: Secondary | ICD-10-CM

## 2016-12-08 DIAGNOSIS — Z79899 Other long term (current) drug therapy: Secondary | ICD-10-CM

## 2016-12-08 DIAGNOSIS — Z1321 Encounter for screening for nutritional disorder: Secondary | ICD-10-CM

## 2016-12-08 DIAGNOSIS — Z5181 Encounter for therapeutic drug level monitoring: Secondary | ICD-10-CM

## 2016-12-08 DIAGNOSIS — Z23 Encounter for immunization: Secondary | ICD-10-CM | POA: Diagnosis not present

## 2016-12-08 DIAGNOSIS — K219 Gastro-esophageal reflux disease without esophagitis: Secondary | ICD-10-CM | POA: Diagnosis not present

## 2016-12-08 DIAGNOSIS — I480 Paroxysmal atrial fibrillation: Secondary | ICD-10-CM | POA: Diagnosis not present

## 2016-12-08 MED ORDER — AMLODIPINE BESY-BENAZEPRIL HCL 10-20 MG PO CAPS: ORAL_CAPSULE | ORAL | 1 refills | Status: DC

## 2016-12-08 MED ORDER — OMEPRAZOLE 40 MG PO CPDR: 40.0000 mg | DELAYED_RELEASE_CAPSULE | Freq: Every day | ORAL | 3 refills | Status: DC

## 2016-12-08 MED ORDER — METOPROLOL SUCCINATE ER 50 MG PO TB24: ORAL_TABLET | ORAL | 1 refills | Status: DC

## 2016-12-08 MED ORDER — LORAZEPAM 0.5 MG PO TABS: 0.5000 mg | ORAL_TABLET | ORAL | 1 refills | Status: DC | PRN

## 2016-12-08 MED ORDER — MESALAMINE 1.2 G PO TBEC: 2.4000 g | DELAYED_RELEASE_TABLET | Freq: Every day | ORAL | 1 refills | Status: DC

## 2016-12-08 NOTE — Patient Instructions (Addendum)
No change in meds at this time. Keep follow up with cardiology, and gastroenterology as planned. I will check magnesium levels, but vitamin B-12 and vitamin D may need to be checked next visit.   Follow up in 6 months for a physical.   Keep a record of your blood pressures outside of the office and if running over 140/90 - return to discuss med changes.   Thanks for coming in today.    IF you received an x-ray today, you will receive an invoice from [facility]. Please contact [facility] at [phone] with questions or concerns regarding your invoice.   IF you received labwork today, you will receive an invoice from [place]. Please contact [facility] at [phone] with questions or concerns regarding your invoice.   Our billing staff will not be able to assist you with questions regarding bills from these companies.  You will be contacted with the lab results as soon as they are available. The fastest way to get your results is to activate your My Chart account. Instructions are located on the last page of this paperwork. If you have not heard from [place] regarding the results in 2 weeks, please contact this office.

## 2016-12-08 NOTE — Progress Notes (Signed)
Subjective:  By signing my name below, I, Essence Howell, attest that this documentation has been prepared under the direction and in the presence of Wendie Agreste, MD Electronically Signed: Ladene Artist, ED Scribe 12/08/2016 at 3:31 PM.   Patient ID: Joseph Smith, male    DOB: [birth year], 71 y.o.   MRN: [id number]  Chief Complaint  Patient presents with  . Medication Refill    refill all needed medications per doctor and Lialda   HPI Joseph Smith is a 71 y.o. male who presents to Primary Care at [facility] for medication refills. Last seen in April. Previous pt of Dr. Everlene Farrier. H/o HTN, GERD, a-fib, hyperlipidemia, ulcerative colitis.  A-fib Cardiologist: Dr. Stanford Breed. On Toprol with control, Eliquis anticoagulation. Seen by cardiology 9/12. No med changes. Denies flares of a-fib.   HTN Toprol 50 mg qd and Lotrel 10-20 mg qd. Home BP controlled at last visit, borderline in office. Pt has been at dose for ~15 years. Denies sob, cp, light-headedness, dizziness.  GERD Prilosec 40 mg qd. Normal magnesium, B12, Vitamin D in May 2017.  Ulcerative Colitis Dr. Penelope Coop; next appointment on 10/22. Takes Lialda. Colonoscopy on 9/18.   Hyperlipidemia Lab Results  Component Value Date   CHOL 182 12/28/2015   HDL 62 12/28/2015   LDLCALC 99 12/28/2015   TRIG 103 12/28/2015   CHOLHDL 2.9 12/28/2015   Lab Results  Component Value Date   ALT 19 12/28/2015   AST 19 12/28/2015   ALKPHOS 48 12/28/2015   BILITOT 0.8 12/28/2015  Continued on pravastatin 40 mg qd. Denies new myalgias.  Situational Anxiety   Has taken ativan PRN. Last prescription for #30 with 1 refill in April. Only takes medications as needed which is less than once/week.   Patient Active Problem List   Diagnosis Date Noted  . Ulcerative colitis ([place]) 05/29/2016  . Bruit 12/03/2012  . Hyperglycemia 02/05/2011  . Impotence due to erectile dysfunction 02/05/2011  . ATRIAL FIBRILLATION 05/10/2010  . PALPITATIONS  04/23/2010  . CHEST PAIN-UNSPECIFIED 04/17/2009  . Hyperlipidemia 04/11/2009  . Essential hypertension 04/11/2009  . GERD 04/11/2009   Past Medical History:  Diagnosis Date  . Atrial fibrillation ([place])   . Chest pain, unspecified   . Esophageal reflux   . Family history of ischemic heart disease   . Other and unspecified hyperlipidemia   . Palpitations   . Ulcerative colitis   . Unspecified essential hypertension    Past Surgical History:  Procedure Laterality Date  . ELBOW SURGERY     left  . KNEE ARTHROSCOPY     rihgt  . VASECTOMY     Allergies  Allergen Reactions  . Codeine Itching    REACTION: Reaction not known   Prior to Admission medications   Medication Sig Start Date End Date Taking? Authorizing Provider  amLODipine-benazepril (LOTREL) 10-20 MG capsule Take  one tablet daily. 05/29/16   Wendie Agreste, MD  ELIQUIS 5 MG TABS tablet TAKE 1 TABLET (5 MG TOTAL) BY MOUTH 2 (TWO) TIMES DAILY. 05/26/16   Lelon Perla, MD  LIALDA 1.2 g EC tablet TAKE 2 TABLETS BY MOUTH DAILY 11/23/15   Harrison Mons, PA-C  LORazepam (ATIVAN) 0.5 MG tablet TAKE 1 TABLET BY MOUTH AS NEEDED 11/26/16   Wendie Agreste, MD  metoprolol succinate (TOPROL-XL) 50 MG 24 hr tablet TAKE 1 TABLET (50 MG TOTAL) BY MOUTH DAILY. 05/29/16   Wendie Agreste, MD  omeprazole (PRILOSEC) 40 MG capsule TAKE 1 CAPSULE (  40 MG TOTAL) BY MOUTH DAILY. 11/24/16   Wendie Agreste, MD  pravastatin (PRAVACHOL) 40 MG tablet Take 1 tablet (40 mg total) by mouth daily. 05/29/16   Wendie Agreste, MD  Triamcinolone Acetonide (TRIAMCINOLONE 0.1 % CREAM : EUCERIN) CREA Apply 1 application topically 2 (two) times daily as needed. 07/19/15   Darlyne Russian, MD  triamcinolone cream (KENALOG) 0.1 % APPLY TWICE A DAY AS NEEDED 05/23/16   Harrison Mons, PA-C   Social History   Social History  . Marital status: Single    Spouse name: N/A  . Number of children: N/A  . Years of education: N/A   Occupational History  .  Not on file.   Social History Main Topics  . Smoking status: Former Smoker    Years: 25.00    Types: Cigarettes, Cigars  . Smokeless tobacco: Never Used  . Alcohol use 0.0 oz/week  . Drug use: No  . Sexual activity: Yes   Other Topics Concern  . Not on file   Social History Narrative   Married. Education: [employer]. Exercise: Weekly.   Review of Systems  Constitutional: Negative for fatigue and unexpected weight change.  Eyes: Negative for visual disturbance.  Respiratory: Negative for cough, chest tightness and shortness of breath.   Cardiovascular: Negative for chest pain, palpitations and leg swelling.  Gastrointestinal: Negative for abdominal pain and blood in stool.  Musculoskeletal: Negative for myalgias.  Neurological: Negative for dizziness, light-headedness and headaches.      Objective:   Physical Exam  Constitutional: He is oriented to person, place, and time. He appears well-developed and well-nourished.  HENT:  Head: Normocephalic and atraumatic.  Eyes: Pupils are equal, round, and reactive to light. EOM are normal.  Neck: No JVD present. Carotid bruit is not present.  Cardiovascular: Normal rate, regular rhythm and normal heart sounds.   No murmur heard. Pulmonary/Chest: Effort normal and breath sounds normal. He has no rales.  Musculoskeletal: He exhibits no edema.  Neurological: He is alert and oriented to person, place, and time.  Skin: Skin is warm and dry.  Psychiatric: He has a normal mood and affect.  Vitals reviewed.  Vitals:   12/08/16 1451 12/08/16 1549  BP: (!) 144/76 (!) 144/80  Pulse: 66   Resp: 16   Temp: 98.4 F (36.9 C)   TempSrc: Oral   SpO2: 98%   Weight: 229 lb 12.8 oz (104.2 kg)   Height: 6' 1"  (1.854 m)       Assessment & Plan:   Joseph Smith is a 71 y.o. male Essential hypertension - Plan: Comprehensive metabolic panel, amLODipine-benazepril (LOTREL) 10-20 MG capsule, metoprolol succinate (TOPROL-XL) 50 MG 24 hr  tablet  -Borderline control. Continue same dose of medications, but if home readings remain elevated, would look at change in dose of medications.  Need for influenza vaccination - Plan: Flu Vaccine QUAD 6+ mos PF IM (Fluarix Quad PF)   Ulcerative pancolitis with rectal bleeding (HCC) - Plan: mesalamine (LIALDA) 1.2 g EC tablet  - Now more stable on continued the elbow. Refilled, continue routine follow-up with gastroenterology.  Paroxysmal atrial fibrillation (HCC)  - Rate controlled with Toprol. Eliquis for anticoagulation, that has been prescribed by cardiology. Denies recent symptoms. Continue routine cardiology follow-up  Gastroesophageal reflux disease, esophagitis presence not specified - Plan: omeprazole (PRILOSEC) 40 MG capsule Encounter for vitamin deficiency screening - Plan: Magnesium Encounter for monitoring long-term proton pump inhibitor therapy - Plan: Magnesium  - Stable with omeprazole daily.  Magnesium obtained to screen for deficiency with chronic PPI use.  Stress - Plan: LORazepam (ATIVAN) 0.5 MG tablet  -Intermittent stressors or insomnia. Stable with infrequent use of Ativan, refilled.   Hyperlipidemia, unspecified hyperlipidemia type - Plan: Comprehensive metabolic panel, Lipid panel  -Check lipids, continue pravastatin same dose for now.  Meds ordered this encounter  Medications  . mesalamine (LIALDA) 1.2 g EC tablet    Sig: Take 2 tablets (2.4 g total) by mouth daily.    Dispense:  180 tablet    Refill:  1  . amLODipine-benazepril (LOTREL) 10-20 MG capsule    Sig: Take  one tablet daily.    Dispense:  90 capsule    Refill:  1  . metoprolol succinate (TOPROL-XL) 50 MG 24 hr tablet    Sig: TAKE 1 TABLET (50 MG TOTAL) BY MOUTH DAILY.    Dispense:  90 tablet    Refill:  1  . omeprazole (PRILOSEC) 40 MG capsule    Sig: Take 1 capsule (40 mg total) by mouth daily.    Dispense:  90 capsule    Refill:  3  . LORazepam (ATIVAN) 0.5 MG tablet    Sig: Take 1  tablet (0.5 mg total) by mouth as needed.    Dispense:  30 tablet    Refill:  1    Not to exceed 4 additional fills before 12/03/2016   Patient Instructions   No change in meds at this time. Keep follow up with cardiology, and gastroenterology as planned. I will check magnesium levels, but vitamin B-12 and vitamin D may need to be checked next visit.   Follow up in 6 months for a physical.   Keep a record of your blood pressures outside of the office and if running over 140/90 - return to discuss med changes.   Thanks for coming in today.    IF you received an x-ray today, you will receive an invoice from [facility]. Please contact [facility] Radiology at [phone] with questions or concerns regarding your invoice.   IF you received labwork today, you will receive an invoice from [place]. Please contact [facility] at [phone] with questions or concerns regarding your invoice.   Our billing staff will not be able to assist you with questions regarding bills from these companies.  You will be contacted with the lab results as soon as they are available. The fastest way to get your results is to activate your My Chart account. Instructions are located on the last page of this paperwork. If you have not heard from [place] regarding the results in 2 weeks, please contact this office.       I personally performed the services described in this documentation, which was scribed in my presence. The recorded information has been reviewed and considered for accuracy and completeness, addended by me as needed, and agree with information above.  Signed,   Merri Ray, MD Primary Care at [place].  12/10/16 10:04 PM

## 2016-12-09 LAB — COMPREHENSIVE METABOLIC PANEL
ALT: 23 IU/L (ref 0–44)
AST: 24 IU/L (ref 0–40)
Albumin/Globulin Ratio: 2.1 (ref 1.2–2.2)
Albumin: 4.7 g/dL (ref 3.5–4.8)
Alkaline Phosphatase: 60 IU/L (ref 39–117)
BUN/Creatinine Ratio: 14 (ref 10–24)
BUN: 15 mg/dL (ref 8–27)
Bilirubin Total: 0.4 mg/dL (ref 0.0–1.2)
CO2: 22 mmol/L (ref 20–29)
Calcium: 8.9 mg/dL (ref 8.6–10.2)
Chloride: 105 mmol/L (ref 96–106)
Creatinine, Ser: 1.09 mg/dL (ref 0.76–1.27)
GFR calc Af Amer: 79 mL/min/{1.73_m2} (ref >59)
GFR calc non Af Amer: 68 mL/min/{1.73_m2} (ref >59)
Globulin, Total: 2.2 g/dL (ref 1.5–4.5)
Glucose: 132 mg/dL — ABNORMAL HIGH (ref 65–99)
Potassium: 3.9 mmol/L (ref 3.5–5.2)
Sodium: 142 mmol/L (ref 134–144)
Total Protein: 6.9 g/dL (ref 6.0–8.5)

## 2016-12-09 LAB — LIPID PANEL
Chol/HDL Ratio: 2.8 ratio (ref 0.0–5.0)
Cholesterol, Total: 186 mg/dL (ref 100–199)
HDL: 67 mg/dL (ref >39)
LDL Calculated: 86 mg/dL (ref 0–99)
Triglycerides: 166 mg/dL — ABNORMAL HIGH (ref 0–149)
VLDL Cholesterol Cal: 33 mg/dL (ref 5–40)

## 2016-12-09 LAB — MAGNESIUM: Magnesium: 2.1 mg/dL (ref 1.6–2.3)

## 2016-12-12 ENCOUNTER — Other Ambulatory Visit: Payer: Self-pay | Admitting: Family Medicine

## 2016-12-12 ENCOUNTER — Ambulatory Visit: Payer: BC Managed Care – PPO | Admitting: Cardiology

## 2016-12-12 DIAGNOSIS — I10 Essential (primary) hypertension: Secondary | ICD-10-CM

## 2016-12-15 DIAGNOSIS — K5130 Ulcerative (chronic) rectosigmoiditis without complications: Secondary | ICD-10-CM | POA: Diagnosis not present

## 2016-12-18 ENCOUNTER — Other Ambulatory Visit: Payer: Self-pay | Admitting: Family Medicine

## 2016-12-18 ENCOUNTER — Encounter: Payer: Self-pay | Admitting: Radiology

## 2016-12-18 DIAGNOSIS — E785 Hyperlipidemia, unspecified: Secondary | ICD-10-CM

## 2017-01-25 ENCOUNTER — Other Ambulatory Visit: Payer: Self-pay | Admitting: Cardiology

## 2017-01-25 DIAGNOSIS — I480 Paroxysmal atrial fibrillation: Secondary | ICD-10-CM

## 2017-02-27 ENCOUNTER — Telehealth: Payer: Self-pay | Admitting: Cardiology

## 2017-02-27 NOTE — Telephone Encounter (Signed)
Returned call to patient Eliquis 5 mg samples left at front desk.

## 2017-02-27 NOTE — Telephone Encounter (Signed)
Patient calling the office for samples of medication:   1.  What medication and dosage are you requesting samples for? Eliquis  2.  Are you currently out of this medication? yes    

## 2017-03-13 ENCOUNTER — Telehealth: Payer: Self-pay | Admitting: Cardiology

## 2017-03-13 NOTE — Telephone Encounter (Signed)
New message    Patient calling the office for samples of medication:   1.  What medication and dosage are you requesting samples for? Eliquis  2.  Are you currently out of this medication? yes   

## 2017-03-13 NOTE — Telephone Encounter (Signed)
Follow up    Pt c/o medication issue:  1. Name of Medication: Eliquis   2. How are you currently taking this medication (dosage and times per day)? TAKE 1 TABLET (5 MG TOTAL) BY MOUTH 2 (TWO) TIMES DAILY  3. Are you having a reaction (difficulty breathing--STAT)? No  4. What is your medication issue? Patient says that insurance company faxed over approval needed for [employer] scripts: [phone] if needed

## 2017-03-13 NOTE — Telephone Encounter (Signed)
PA for eliquis complete and approved via cover my meds.

## 2017-05-18 ENCOUNTER — Telehealth: Payer: Self-pay | Admitting: Cardiology

## 2017-05-18 DIAGNOSIS — I480 Paroxysmal atrial fibrillation: Secondary | ICD-10-CM

## 2017-05-18 NOTE — Telephone Encounter (Signed)
New Message    *STAT* If patient is at the pharmacy, call can be transferred to refill team.   1. Which medications need to be refilled? (please list name of each medication and dose if known) Eliquis 73m   2. Which pharmacy/location (including street and city if local pharmacy) is [place] to be sent to[place] [phone] 3. Do they need a 30 day or 90 day supply? 90   [facility] has [facility]. That is the only way his insurance will cover it. Wife can also be contacted at [phone](Ashok Norris

## 2017-05-19 MED ORDER — APIXABAN 5 MG PO TABS: 5.0000 mg | ORAL_TABLET | Freq: Two times a day (BID) | ORAL | 0 refills | Status: DC

## 2017-05-19 NOTE — Telephone Encounter (Signed)
Rx has been sent to the pharmacy electronically. ° °

## 2017-05-28 NOTE — Progress Notes (Signed)
HPI: FU atrial fibrillation. Nuclear study in February of 2011 showed an ejection fraction of 62% and normal perfusion. Echocardiogram in March of 2012 showed normal LV function, mild left atrial enlargement and mild mitral regurgitation. Patient had a monitor in February of 2012 that showed documentation of atrial fibrillation. Treated with beta blocker and apixaban. Abdominal ultrasound October 2014 showed no aneurysm. Since he was last seen, the patient denies any dyspnea on exertion, orthopnea, PND, pedal edema, palpitations, syncope or chest pain.   Current Outpatient Medications  Medication Sig Dispense Refill  . amLODipine-benazepril (LOTREL) 10-20 MG capsule Take  one tablet daily. 90 capsule 1  . apixaban (ELIQUIS) 5 MG TABS tablet Take 1 tablet (5 mg total) by mouth 2 (two) times daily. 180 tablet 0  . LORazepam (ATIVAN) 0.5 MG tablet Take 1 tablet (0.5 mg total) by mouth as needed. 30 tablet 1  . mesalamine (LIALDA) 1.2 g EC tablet Take 2 tablets (2.4 g total) by mouth daily. 180 tablet 1  . metoprolol succinate (TOPROL-XL) 50 MG 24 hr tablet TAKE 1 TABLET (50 MG TOTAL) BY MOUTH DAILY. 90 tablet 1  . omeprazole (PRILOSEC) 40 MG capsule Take 1 capsule (40 mg total) by mouth daily. 90 capsule 3  . pravastatin (PRAVACHOL) 40 MG tablet TAKE 1 TABLET (40 MG TOTAL) BY MOUTH DAILY. 90 tablet 1  . triamcinolone cream (KENALOG) 0.1 % APPLY TWICE A DAY AS NEEDED 480 g 1   No current facility-administered medications for this visit.      Past Medical History:  Diagnosis Date  . Atrial fibrillation ([place])   . Chest pain, unspecified   . Esophageal reflux   . Family history of ischemic heart disease   . Other and unspecified hyperlipidemia   . Palpitations   . Ulcerative colitis   . Unspecified essential hypertension     Past Surgical History:  Procedure Laterality Date  . ELBOW SURGERY     left  . KNEE ARTHROSCOPY     rihgt  . VASECTOMY      Social History    Socioeconomic History  . Marital status: Single    Spouse name: Not on file  . Number of children: Not on file  . Years of education: Not on file  . Highest education level: Not on file  Occupational History  . Not on file  Social Needs  . Financial resource strain: Not on file  . Food insecurity:    Worry: Not on file    Inability: Not on file  . Transportation needs:    Medical: Not on file    Non-medical: Not on file  Tobacco Use  . Smoking status: Former Smoker    Years: 25.00    Types: Cigarettes, Cigars  . Smokeless tobacco: Never Used  Substance and Sexual Activity  . Alcohol use: Yes    Alcohol/week: 0.0 oz  . Drug use: No  . Sexual activity: Yes  Lifestyle  . Physical activity:    Days per week: Not on file    Minutes per session: Not on file  . Stress: Not on file  Relationships  . Social connections:    Talks on phone: Not on file    Gets together: Not on file    Attends religious service: Not on file    Active member of club or organization: Not on file    Attends meetings of clubs or organizations: Not on file    Relationship status: Not on  file  . Intimate partner violence:    Fear of current or ex partner: Not on file    Emotionally abused: Not on file    Physically abused: Not on file    Forced sexual activity: Not on file  Other Topics Concern  . Not on file  Social History Narrative   Married. Education: [employer]. Exercise: Weekly.    Family History  Problem Relation Age of Onset  . Stroke Brother   . Coronary artery disease Unknown        family history    ROS: no fevers or chills, productive cough, hemoptysis, dysphasia, odynophagia, melena, hematochezia, dysuria, hematuria, rash, seizure activity, orthopnea, PND, pedal edema, claudication. Remaining systems are negative.  Physical Exam: Well-developed well-nourished in no acute distress.  Skin is warm and dry.  HEENT is normal.  Neck is supple.  Chest is clear to auscultation  with normal expansion.  Cardiovascular exam is regular rate and rhythm.  Abdominal exam nontender or distended. No masses palpated. Extremities show no edema. neuro grossly intact  A/P  1 paroxysmal atrial fibrillation-patient remains in sinus rhythm today.  Continue Toprol for rate control if atrial fibrillation recurs.  Continue apixaban.  Check hemoglobin and renal function.  2 hypertension-blood pressure is controlled.  Continue present medications.  3 hyperlipidemia-continue statin.  Check lipids and liver.  Kirk Ruths, MD

## 2017-06-01 ENCOUNTER — Telehealth: Payer: Self-pay | Admitting: Cardiology

## 2017-06-01 NOTE — Telephone Encounter (Signed)
New Message:    Pt states he has mistakenly taken 2 of his Eliquis pills and he wants to know what should he do and if he will be okay.

## 2017-06-01 NOTE — Telephone Encounter (Signed)
Left a message on the patient's voicemail, per the dpr. Per pharmd, he may take his Eliquis tonight to keep on schedule. If he has any further questions, he can call the office back at anytime.

## 2017-06-02 ENCOUNTER — Other Ambulatory Visit: Payer: Self-pay | Admitting: Family Medicine

## 2017-06-02 DIAGNOSIS — F439 Reaction to severe stress, unspecified: Secondary | ICD-10-CM

## 2017-06-02 NOTE — Telephone Encounter (Signed)
Copied from [place] [phone]. Topic: Quick Communication - Rx Refill/Question >> Jun 02, 2017 12:50 PM Joseph Smith wrote: Medication: LORazepam (ATIVAN) 0.5 MG tablet  Pt had 1 refill left but didn't get it refilled in time. And he is going out of town and is hoping to get a refill before going out of town. Pt is hoping this can be called in today. I did inform him of the 3 day turn around time.   Has the patient contacted their pharmacy? Yes.   (Agent: If no, request that the patient contact the pharmacy for the refill.) Preferred Pharmacy (with phone number or street name): [id number] [place] Please be advised that RX refills may take up to 3 business days. We ask that you follow-up with your pharmacy.

## 2017-06-02 NOTE — Telephone Encounter (Signed)
Lorazepam-patient is hoping to get refill today. He is going out of town.  Last OV: 12/08/16  PCP: [place]: [id number] [place](Phone) [phone](Fax)

## 2017-06-03 ENCOUNTER — Encounter: Payer: Self-pay | Admitting: Cardiology

## 2017-06-03 ENCOUNTER — Ambulatory Visit (INDEPENDENT_AMBULATORY_CARE_PROVIDER_SITE_OTHER): Payer: BLUE CROSS/BLUE SHIELD | Admitting: Cardiology

## 2017-06-03 VITALS — BP 110/64 | HR 59 | Ht 73.0 in | Wt 217.4 lb

## 2017-06-03 DIAGNOSIS — I10 Essential (primary) hypertension: Secondary | ICD-10-CM

## 2017-06-03 DIAGNOSIS — I480 Paroxysmal atrial fibrillation: Secondary | ICD-10-CM

## 2017-06-03 DIAGNOSIS — E785 Hyperlipidemia, unspecified: Secondary | ICD-10-CM

## 2017-06-03 LAB — LIPID PANEL
Chol/HDL Ratio: 2.7 ratio (ref 0.0–5.0)
Cholesterol, Total: 179 mg/dL (ref 100–199)
HDL: 67 mg/dL (ref >39)
LDL Calculated: 92 mg/dL (ref 0–99)
Triglycerides: 100 mg/dL (ref 0–149)
VLDL Cholesterol Cal: 20 mg/dL (ref 5–40)

## 2017-06-03 LAB — COMPREHENSIVE METABOLIC PANEL
ALT: 43 IU/L (ref 0–44)
AST: 34 IU/L (ref 0–40)
Albumin/Globulin Ratio: 2 (ref 1.2–2.2)
Albumin: 4.7 g/dL (ref 3.5–4.8)
Alkaline Phosphatase: 62 IU/L (ref 39–117)
BUN/Creatinine Ratio: 21 (ref 10–24)
BUN: 21 mg/dL (ref 8–27)
Bilirubin Total: 0.6 mg/dL (ref 0.0–1.2)
CO2: 23 mmol/L (ref 20–29)
Calcium: 9.4 mg/dL (ref 8.6–10.2)
Chloride: 99 mmol/L (ref 96–106)
Creatinine, Ser: 1 mg/dL (ref 0.76–1.27)
GFR calc Af Amer: 88 mL/min/{1.73_m2} (ref >59)
GFR calc non Af Amer: 76 mL/min/{1.73_m2} (ref >59)
Globulin, Total: 2.4 g/dL (ref 1.5–4.5)
Glucose: 108 mg/dL — ABNORMAL HIGH (ref 65–99)
Potassium: 4.7 mmol/L (ref 3.5–5.2)
Sodium: 140 mmol/L (ref 134–144)
Total Protein: 7.1 g/dL (ref 6.0–8.5)

## 2017-06-03 LAB — CBC
Hematocrit: 39.3 % (ref 37.5–51.0)
Hemoglobin: 13.4 g/dL (ref 13.0–17.7)
MCH: 31.3 pg (ref 26.6–33.0)
MCHC: 34.1 g/dL (ref 31.5–35.7)
MCV: 92 fL (ref 79–97)
Platelets: 216 10*3/uL (ref 150–379)
RBC: 4.28 x10E6/uL (ref 4.14–5.80)
RDW: 13.8 % (ref 12.3–15.4)
WBC: 4.1 10*3/uL (ref 3.4–10.8)

## 2017-06-03 MED ORDER — LORAZEPAM 0.5 MG PO TABS: 0.5000 mg | ORAL_TABLET | ORAL | 0 refills | Status: DC | PRN

## 2017-06-03 MED ORDER — LORAZEPAM 0.5 MG PO TABS: 0.5000 mg | ORAL_TABLET | ORAL | 1 refills | Status: DC | PRN

## 2017-06-03 NOTE — Telephone Encounter (Signed)
Refilled, due for office visit prior to further refills.  Thanks.   Initial prescription that was printed will be shred, I sent another one electronically to his pharmacy.

## 2017-06-03 NOTE — Patient Instructions (Signed)
Medication Instructions:   NO CHANGE  Labwork:  Your physician recommends that you HAVE LAB WORK TODAY  Follow-Up:  Your physician wants you to follow-up in: [place] will receive a reminder letter in the mail two months in advance. If you don't receive a letter, please call our office to schedule the follow-up appointment.   If you need a refill on your cardiac medications before your next appointment, please call your pharmacy.

## 2017-06-08 ENCOUNTER — Other Ambulatory Visit: Payer: Self-pay | Admitting: Family Medicine

## 2017-06-08 ENCOUNTER — Telehealth: Payer: Self-pay | Admitting: *Deleted

## 2017-06-08 ENCOUNTER — Other Ambulatory Visit: Payer: Self-pay | Admitting: *Deleted

## 2017-06-08 DIAGNOSIS — I10 Essential (primary) hypertension: Secondary | ICD-10-CM

## 2017-06-08 DIAGNOSIS — F439 Reaction to severe stress, unspecified: Secondary | ICD-10-CM

## 2017-06-08 NOTE — Telephone Encounter (Signed)
Prescription called in for 30 days.  Advised he will need and appointment.

## 2017-06-08 NOTE — Telephone Encounter (Signed)
Pt will be leaving town on 4-18 and would like metoprol blood pressure pill sent to pharm

## 2017-06-11 ENCOUNTER — Other Ambulatory Visit: Payer: Self-pay | Admitting: Family Medicine

## 2017-06-11 DIAGNOSIS — I10 Essential (primary) hypertension: Secondary | ICD-10-CM

## 2017-06-17 ENCOUNTER — Other Ambulatory Visit: Payer: Self-pay | Admitting: Family Medicine

## 2017-06-17 DIAGNOSIS — E785 Hyperlipidemia, unspecified: Secondary | ICD-10-CM

## 2017-06-18 ENCOUNTER — Other Ambulatory Visit: Payer: Self-pay | Admitting: Family Medicine

## 2017-06-18 DIAGNOSIS — E785 Hyperlipidemia, unspecified: Secondary | ICD-10-CM

## 2017-07-07 ENCOUNTER — Encounter: Payer: Self-pay | Admitting: Family Medicine

## 2017-07-07 ENCOUNTER — Ambulatory Visit (INDEPENDENT_AMBULATORY_CARE_PROVIDER_SITE_OTHER): Payer: BLUE CROSS/BLUE SHIELD | Admitting: Family Medicine

## 2017-07-07 DIAGNOSIS — I10 Essential (primary) hypertension: Secondary | ICD-10-CM

## 2017-07-07 DIAGNOSIS — F439 Reaction to severe stress, unspecified: Secondary | ICD-10-CM | POA: Diagnosis not present

## 2017-07-07 DIAGNOSIS — E785 Hyperlipidemia, unspecified: Secondary | ICD-10-CM

## 2017-07-07 MED ORDER — PRAVASTATIN SODIUM 40 MG PO TABS: 40.0000 mg | ORAL_TABLET | Freq: Every day | ORAL | 2 refills | Status: DC

## 2017-07-07 MED ORDER — AMLODIPINE BESY-BENAZEPRIL HCL 10-20 MG PO CAPS: ORAL_CAPSULE | ORAL | 2 refills | Status: DC

## 2017-07-07 MED ORDER — METOPROLOL SUCCINATE ER 50 MG PO TB24: ORAL_TABLET | ORAL | 2 refills | Status: DC

## 2017-07-07 MED ORDER — LORAZEPAM 0.5 MG PO TABS: 0.5000 mg | ORAL_TABLET | ORAL | 1 refills | Status: DC | PRN

## 2017-07-07 NOTE — Progress Notes (Signed)
Subjective:  By signing my name below, I, Moises Blood, attest that this documentation has been prepared under the direction and in the presence of Merri Ray, MD. Electronically Signed: Moises Blood, [place]. 07/07/2017 , 2:48 PM .  Patient was seen in Room 3 .   Patient ID: Joseph Smith, male    DOB: 1945-04-22, 72 y.o.   MRN: [id number] Chief Complaint  Patient presents with  . Medication Refill    toprol,lotrel,pravachol,ativan   HPI Joseph Smith is a 72 y.o. male Here for medication refills.   HTN He takes Lotrel 10-51m and Toprol 54mQD. His home BP readings were controlled when discussed in Oct. He was continued on same regime. His BP was controlled at his last cardiologist visit.   BP Readings from Last 3 Encounters:  07/07/17 (!) 146/79  06/03/17 110/64  12/08/16 (!) 144/80   Patient reports home BP readings have been running around 126/79. He has had stressors recently, [place] to [place] to mother-in-law not doing well with cancer. He denies chest pain, shortness of breath, or dizziness.   Hyperglycemia His glucose was 132 in Oct, and then 108 at last cardiologist visit in April. He reports he's been exercising and changing in his diet as well.   Wt Readings from Last 3 Encounters:  07/07/17 216 lb (98 kg)  06/03/17 217 lb 6.4 oz (98.6 kg)  12/08/16 229 lb 12.8 oz (104.2 kg)    Paroxysmal afib He takes Eliquis for anticoagulation, and Toprol for rate control. His cardiologist in Dr. CrStanford Breedwith last office visit on April 10th.   Hyperlipidemia Lab Results  Component Value Date   CHOL 179 06/03/2017   HDL 67 06/03/2017   LDLCALC 92 06/03/2017   TRIG 100 06/03/2017   CHOLHDL 2.7 06/03/2017   Lab Results  Component Value Date   ALT 43 06/03/2017   AST 34 06/03/2017   ALKPHOS 62 06/03/2017   BILITOT 0.6 06/03/2017   He takes pravastatin 4075mD.   Situational anxiety/stress He had intermittent stress and insomnia in the past,  with infrequent use of Ativan as controlled symptoms. He denies any new side effects. He was prescribed #30 with 1 refill in Oct.   He takes half pill Ativan as needed. He's noticed, if he takes 2 nights in a row, he becomes cranky and agitated.   Patient Active Problem List   Diagnosis Date Noted  . Ulcerative colitis ([place]4/06/2016  . Bruit 12/03/2012  . Hyperglycemia 02/05/2011  . Impotence due to erectile dysfunction 02/05/2011  . ATRIAL FIBRILLATION 05/10/2010  . PALPITATIONS 04/23/2010  . CHEST PAIN-UNSPECIFIED 04/17/2009  . Hyperlipidemia 04/11/2009  . Essential hypertension 04/11/2009  . GERD 04/11/2009   Past Medical History:  Diagnosis Date  . Atrial fibrillation ([place] . Chest pain, unspecified   . Esophageal reflux   . Family history of ischemic heart disease   . Other and unspecified hyperlipidemia   . Palpitations   . Ulcerative colitis   . Unspecified essential hypertension    Past Surgical History:  Procedure Laterality Date  . ELBOW SURGERY     left  . KNEE ARTHROSCOPY     rihgt  . VASECTOMY     Allergies  Allergen Reactions  . Codeine Itching    REACTION: Reaction not known   Prior to Admission medications   Medication Sig Start Date End Date Taking? Authorizing Provider  amLODipine-benazepril (LOTREL) 10-20 MG capsule TAKE ONE CAPSULE BY MOUTH EVERY DAY Patient  needs office visit for more refills 06/11/17  Yes Wendie Agreste, MD  apixaban (ELIQUIS) 5 MG TABS tablet Take 1 tablet (5 mg total) by mouth 2 (two) times daily. 05/19/17  Yes Lelon Perla, MD  LORazepam (ATIVAN) 0.5 MG tablet Take 1 tablet (0.5 mg total) by mouth as needed. 06/03/17  Yes Wendie Agreste, MD  mesalamine (LIALDA) 1.2 g EC tablet Take 2 tablets (2.4 g total) by mouth daily. 12/08/16  Yes Wendie Agreste, MD  metoprolol succinate (TOPROL-XL) 50 MG 24 hr tablet TAKE 1 TABLET BY MOUTH EVERY DAY Patient needs an appointment 06/08/17  Yes Wendie Agreste, MD    omeprazole (PRILOSEC) 40 MG capsule Take 1 capsule (40 mg total) by mouth daily. 12/08/16  Yes Wendie Agreste, MD  pravastatin (PRAVACHOL) 40 MG tablet TAKE 1 TABLET BY MOUTH EVERY DAY 06/18/17  Yes Wendie Agreste, MD  triamcinolone cream (KENALOG) 0.1 % APPLY TWICE A DAY AS NEEDED 05/23/16  Yes Harrison Mons, PA-C   Social History   Socioeconomic History  . Marital status: Single    Spouse name: Not on file  . Number of children: Not on file  . Years of education: Not on file  . Highest education level: Not on file  Occupational History  . Not on file  Social Needs  . Financial resource strain: Not on file  . Food insecurity:    Worry: Not on file    Inability: Not on file  . Transportation needs:    Medical: Not on file    Non-medical: Not on file  Tobacco Use  . Smoking status: Former Smoker    Years: 25.00    Types: Cigarettes, Cigars  . Smokeless tobacco: Never Used  Substance and Sexual Activity  . Alcohol use: Yes    Alcohol/week: 0.0 oz  . Drug use: No  . Sexual activity: Yes  Lifestyle  . Physical activity:    Days per week: Not on file    Minutes per session: Not on file  . Stress: Not on file  Relationships  . Social connections:    Talks on phone: Not on file    Gets together: Not on file    Attends religious service: Not on file    Active member of club or organization: Not on file    Attends meetings of clubs or organizations: Not on file    Relationship status: Not on file  . Intimate partner violence:    Fear of current or ex partner: Not on file    Emotionally abused: Not on file    Physically abused: Not on file    Forced sexual activity: Not on file  Other Topics Concern  . Not on file  Social History Narrative   Married. Education: [employer]. Exercise: Weekly.   Review of Systems  Constitutional: Negative for fatigue and unexpected weight change.  Eyes: Negative for visual disturbance.  Respiratory: Negative for cough, chest  tightness and shortness of breath.   Cardiovascular: Negative for chest pain, palpitations and leg swelling.  Gastrointestinal: Negative for abdominal pain and blood in stool.  Neurological: Negative for dizziness, light-headedness and headaches.       Objective:   Physical Exam  Constitutional: He is oriented to person, place, and time. He appears well-developed and well-nourished.  HENT:  Head: Normocephalic and atraumatic.  Eyes: Pupils are equal, round, and reactive to light. EOM are normal.  Neck: No JVD present. Carotid bruit is not present.  Cardiovascular: Normal rate, regular rhythm and normal heart sounds.  No murmur heard. Pulmonary/Chest: Effort normal and breath sounds normal. He has no rales.  Musculoskeletal: He exhibits no edema.  Neurological: He is alert and oriented to person, place, and time.  Skin: Skin is warm and dry.  Psychiatric: He has a normal mood and affect.  Vitals reviewed.   Vitals:   07/07/17 1403  BP: (!) 146/79  Pulse: (!) 50  Resp: 17  Temp: 98.1 F (36.7 C)  TempSrc: Oral  SpO2: 98%  Weight: 216 lb (98 kg)  Height: 6' (1.829 m)       Assessment & Plan:   Joseph Smith is a 72 y.o. male Hyperlipidemia, unspecified hyperlipidemia type - Plan: pravastatin (PRAVACHOL) 40 MG tablet  - tolerating pravachol. Recent labs reviewed. Commended on diet changes and weight loss.  Recheck labs in 6 months  Essential hypertension - Plan: metoprolol succinate (TOPROL-XL) 50 MG 24 hr tablet, amLODipine-benazepril (LOTREL) 10-20 MG capsule  -Borderline elevated here in the office, but was normal at recent cardiology visit as well as at home.  No change in regimen for now.  Recent creatinine reviewed, suspect improved control with diet and weight changes.  Stress - Plan: LORazepam (ATIVAN) 0.5 MG tablet  -Overall controlled with rare use of Ativan.  Handout given on stress management but feel he is coping with stressors well at this time.  If  increasing need for Ativan, return to discuss other options  Previous hyperglycemia, improved on most recent lab work.  Expect that to continue to improve with diet/weight changes.  Recheck in 6 months with possible A1c at that time.  Meds ordered this encounter  Medications  . pravastatin (PRAVACHOL) 40 MG tablet    Sig: Take 1 tablet (40 mg total) by mouth daily.    Dispense:  90 tablet    Refill:  2  . metoprolol succinate (TOPROL-XL) 50 MG 24 hr tablet    Sig: TAKE 1 TABLET BY MOUTH EVERY DAY    Dispense:  90 tablet    Refill:  2  . LORazepam (ATIVAN) 0.5 MG tablet    Sig: Take 1 tablet (0.5 mg total) by mouth as needed.    Dispense:  30 tablet    Refill:  1    Not to exceed 4 additional fills before 12/03/2016  . amLODipine-benazepril (LOTREL) 10-20 MG capsule    Sig: TAKE ONE CAPSULE BY MOUTH EVERY DAY    Dispense:  90 capsule    Refill:  2   Patient Instructions   Blood pressure looks ok at cardiology and at home. No change in blood pressure meds at this time.   Cholesterol test looked ok. No change in cholesterol med for now.   Ok to use ativan if needed, but if that is needed more frequently - let me know. I did provide some info on stress management below.   Blood sugar was mildly elevated recently, but better with weight loss - keep up the good work. I would recommend rechecking that level with 3 month average blood test in next 6 months.   Thanks for coming in today.   Stress and Stress Management Stress is a normal reaction to life events. It is what you feel when life demands more than you are used to or more than you can handle. Some stress can be useful. For example, the stress reaction can help you catch the last bus of the day, study for a test, or  meet a deadline at work. But stress that occurs too often or for too long can cause problems. It can affect your emotional health and interfere with relationships and normal daily activities. Too much stress can  weaken your immune system and increase your risk for physical illness. If you already have a medical problem, stress can make it worse. What are the causes? All sorts of life events may cause stress. An event that causes stress for one person may not be stressful for another person. Major life events commonly cause stress. These may be positive or negative. Examples include losing your job, moving into a new home, getting married, having a baby, or losing a loved one. Less obvious life events may also cause stress, especially if they occur day after day or in combination. Examples include working long hours, driving in traffic, caring for children, being in debt, or being in a difficult relationship. What are the signs or symptoms? Stress may cause emotional symptoms including, the following:  Anxiety. This is feeling worried, afraid, on edge, overwhelmed, or out of control.  Anger. This is feeling irritated or impatient.  Depression. This is feeling sad, down, helpless, or guilty.  Difficulty focusing, remembering, or making decisions.  Stress may cause physical symptoms, including the following:  Aches and pains. These may affect your head, neck, back, stomach, or other areas of your body.  Tight muscles or clenched jaw.  Low energy or trouble sleeping.  Stress may cause unhealthy behaviors, including the following:  Eating to feel better (overeating) or skipping meals.  Sleeping too little, too much, or both.  Working too much or putting off tasks (procrastination).  Smoking, drinking alcohol, or using drugs to feel better.  How is this diagnosed? Stress is diagnosed through an assessment by your health care provider. Your health care provider will ask questions about your symptoms and any stressful life events.Your health care provider will also ask about your medical history and may order blood tests or other tests. Certain medical conditions and medicine can cause physical  symptoms similar to stress. Mental illness can cause emotional symptoms and unhealthy behaviors similar to stress. Your health care provider may refer you to a mental health professional for further evaluation. How is this treated? Stress management is the recommended treatment for stress.The goals of stress management are reducing stressful life events and coping with stress in healthy ways. Techniques for reducing stressful life events include the following:  Stress identification. Self-monitor for stress and identify what causes stress for you. These skills may help you to avoid some stressful events.  Time management. Set your priorities, keep a calendar of events, and learn to say "no." These tools can help you avoid making too many commitments.  Techniques for coping with stress include the following:  Rethinking the problem. Try to think realistically about stressful events rather than ignoring them or overreacting. Try to find the positives in a stressful situation rather than focusing on the negatives.  Exercise. Physical exercise can release both physical and emotional tension. The key is to find a form of exercise you enjoy and do it regularly.  Relaxation techniques. These relax the body and mind. Examples include yoga, meditation, tai chi, biofeedback, deep breathing, progressive muscle relaxation, listening to music, being out in nature, journaling, and other hobbies. Again, the key is to find one or more that you enjoy and can do regularly.  Healthy lifestyle. Eat a balanced diet, get plenty of sleep, and do not smoke. Avoid  using alcohol or drugs to relax.  Strong support network. Spend time with family, friends, or other people you enjoy being around.Express your feelings and talk things over with someone you trust.  Counseling or talktherapy with a mental health professional may be helpful if you are having difficulty managing stress on your own. Medicine is typically not  recommended for the treatment of stress.Talk to your health care provider if you think you need medicine for symptoms of stress. Follow these instructions at home:  Keep all follow-up visits as directed by your health care provider.  Take all medicines as directed by your health care provider. Contact a health care provider if:  Your symptoms get worse or you start having new symptoms.  You feel overwhelmed by your problems and can no longer manage them on your own. Get help right away if:  You feel like hurting yourself or someone else. This information is not intended to replace advice given to you by your health care provider. Make sure you discuss any questions you have with your health care provider. Document Released: 08/06/2000 Document Revised: 07/19/2015 Document Reviewed: 10/05/2012 Elsevier Interactive Patient Education  2017 [employer].  IF you received an x-ray today, you will receive an invoice from [facility]. Please contact [facility] at [phone] with questions or concerns regarding your invoice.   IF you received labwork today, you will receive an invoice from [facility]. Please contact [facility] at [phone] with questions or concerns regarding your invoice.   Our billing staff will not be able to assist you with questions regarding bills from these companies.  You will be contacted with the lab results as soon as they are available. The fastest way to get your results is to activate your My Chart account. Instructions are located on the last page of this paperwork. If you have not heard from [place] regarding the results in 2 weeks, please contact this office.       I personally performed the services described in this documentation, which was scribed in my presence. The recorded information has been reviewed and considered for accuracy and completeness, addended by me as needed, and agree with information above.  Signed,   Merri Ray,  MD [facility] [place].  07/07/17 4:44 PM

## 2017-07-07 NOTE — Patient Instructions (Addendum)
Blood pressure looks ok at cardiology and at home. No change in blood pressure meds at this time.   Cholesterol test looked ok. No change in cholesterol med for now.   Ok to use ativan if needed, but if that is needed more frequently - let me know. I did provide some info on stress management below.   Blood sugar was mildly elevated recently, but better with weight loss - keep up the good work. I would recommend rechecking that level with 3 month average blood test in next 6 months.   Thanks for coming in today.   Stress and Stress Management Stress is a normal reaction to life events. It is what you feel when life demands more than you are used to or more than you can handle. Some stress can be useful. For example, the stress reaction can help you catch the last bus of the day, study for a test, or meet a deadline at work. But stress that occurs too often or for too long can cause problems. It can affect your emotional health and interfere with relationships and normal daily activities. Too much stress can weaken your immune system and increase your risk for physical illness. If you already have a medical problem, stress can make it worse. What are the causes? All sorts of life events may cause stress. An event that causes stress for one person may not be stressful for another person. Major life events commonly cause stress. These may be positive or negative. Examples include losing your job, moving into a new home, getting married, having a baby, or losing a loved one. Less obvious life events may also cause stress, especially if they occur day after day or in combination. Examples include working long hours, driving in traffic, caring for children, being in debt, or being in a difficult relationship. What are the signs or symptoms? Stress may cause emotional symptoms including, the following:  Anxiety. This is feeling worried, afraid, on edge, overwhelmed, or out of control.  Anger. This is  feeling irritated or impatient.  Depression. This is feeling sad, down, helpless, or guilty.  Difficulty focusing, remembering, or making decisions.  Stress may cause physical symptoms, including the following:  Aches and pains. These may affect your head, neck, back, stomach, or other areas of your body.  Tight muscles or clenched jaw.  Low energy or trouble sleeping.  Stress may cause unhealthy behaviors, including the following:  Eating to feel better (overeating) or skipping meals.  Sleeping too little, too much, or both.  Working too much or putting off tasks (procrastination).  Smoking, drinking alcohol, or using drugs to feel better.  How is this diagnosed? Stress is diagnosed through an assessment by your health care provider. Your health care provider will ask questions about your symptoms and any stressful life events.Your health care provider will also ask about your medical history and may order blood tests or other tests. Certain medical conditions and medicine can cause physical symptoms similar to stress. Mental illness can cause emotional symptoms and unhealthy behaviors similar to stress. Your health care provider may refer you to a mental health professional for further evaluation. How is this treated? Stress management is the recommended treatment for stress.The goals of stress management are reducing stressful life events and coping with stress in healthy ways. Techniques for reducing stressful life events include the following:  Stress identification. Self-monitor for stress and identify what causes stress for you. These skills may help you to avoid some  stressful events.  Time management. Set your priorities, keep a calendar of events, and learn to say "no." These tools can help you avoid making too many commitments.  Techniques for coping with stress include the following:  Rethinking the problem. Try to think realistically about stressful events rather  than ignoring them or overreacting. Try to find the positives in a stressful situation rather than focusing on the negatives.  Exercise. Physical exercise can release both physical and emotional tension. The key is to find a form of exercise you enjoy and do it regularly.  Relaxation techniques. These relax the body and mind. Examples include yoga, meditation, tai chi, biofeedback, deep breathing, progressive muscle relaxation, listening to music, being out in nature, journaling, and other hobbies. Again, the key is to find one or more that you enjoy and can do regularly.  Healthy lifestyle. Eat a balanced diet, get plenty of sleep, and do not smoke. Avoid using alcohol or drugs to relax.  Strong support network. Spend time with family, friends, or other people you enjoy being around.Express your feelings and talk things over with someone you trust.  Counseling or talktherapy with a mental health professional may be helpful if you are having difficulty managing stress on your own. Medicine is typically not recommended for the treatment of stress.Talk to your health care provider if you think you need medicine for symptoms of stress. Follow these instructions at home:  Keep all follow-up visits as directed by your health care provider.  Take all medicines as directed by your health care provider. Contact a health care provider if:  Your symptoms get worse or you start having new symptoms.  You feel overwhelmed by your problems and can no longer manage them on your own. Get help right away if:  You feel like hurting yourself or someone else. This information is not intended to replace advice given to you by your health care provider. Make sure you discuss any questions you have with your health care provider. Document Released: 08/06/2000 Document Revised: 07/19/2015 Document Reviewed: 10/05/2012 Elsevier Interactive Patient Education  2017 [employer].  IF you received an x-ray today,  you will receive an invoice from [facility]. Please contact [facility] at [phone] with questions or concerns regarding your invoice.   IF you received labwork today, you will receive an invoice from [facility]. Please contact [facility] at [phone] with questions or concerns regarding your invoice.   Our billing staff will not be able to assist you with questions regarding bills from these companies.  You will be contacted with the lab results as soon as they are available. The fastest way to get your results is to activate your My Chart account. Instructions are located on the last page of this paperwork. If you have not heard from [place] regarding the results in 2 weeks, please contact this office.

## 2017-07-10 ENCOUNTER — Other Ambulatory Visit: Payer: Self-pay | Admitting: Family Medicine

## 2017-07-10 DIAGNOSIS — I10 Essential (primary) hypertension: Secondary | ICD-10-CM

## 2017-07-15 ENCOUNTER — Other Ambulatory Visit: Payer: Self-pay | Admitting: Family Medicine

## 2017-07-15 DIAGNOSIS — E785 Hyperlipidemia, unspecified: Secondary | ICD-10-CM

## 2017-08-25 ENCOUNTER — Other Ambulatory Visit: Payer: Self-pay | Admitting: Cardiology

## 2017-08-25 DIAGNOSIS — I480 Paroxysmal atrial fibrillation: Secondary | ICD-10-CM

## 2017-08-25 NOTE — Telephone Encounter (Signed)
New message:       *STAT* If patient is at the pharmacy, call can be transferred to refill team.   1. Which medications need to be refilled? (please list name of each medication and dose if known)apixaban (ELIQUIS) 5 MG TABS tablet   2. Which pharmacy/location (including street and city if local pharmacy) is medication to be sent to?[facility] [place] [place] - [address] AT [place]  3. Do they need a 30 day or 90 day supply? [place]

## 2017-08-26 MED ORDER — APIXABAN 5 MG PO TABS: 5.0000 mg | ORAL_TABLET | Freq: Two times a day (BID) | ORAL | 1 refills | Status: DC

## 2017-08-31 ENCOUNTER — Other Ambulatory Visit: Payer: Self-pay | Admitting: *Deleted

## 2017-08-31 DIAGNOSIS — I480 Paroxysmal atrial fibrillation: Secondary | ICD-10-CM

## 2017-08-31 MED ORDER — APIXABAN 5 MG PO TABS: 5.0000 mg | ORAL_TABLET | Freq: Two times a day (BID) | ORAL | 1 refills | Status: DC

## 2017-08-31 NOTE — Telephone Encounter (Signed)
Patient came to office regarding Eliquis refill. [facility]. Sent to [facility] as requested, called to confirm received.

## 2017-09-01 ENCOUNTER — Other Ambulatory Visit: Payer: Self-pay | Admitting: Pharmacist Clinician (PhC)/ Clinical Pharmacy Specialist

## 2017-09-01 DIAGNOSIS — I480 Paroxysmal atrial fibrillation: Secondary | ICD-10-CM

## 2017-09-01 MED ORDER — APIXABAN 5 MG PO TABS: 5.0000 mg | ORAL_TABLET | Freq: Two times a day (BID) | ORAL | 1 refills | Status: DC

## 2017-10-08 ENCOUNTER — Other Ambulatory Visit: Payer: Self-pay | Admitting: Family Medicine

## 2017-10-08 DIAGNOSIS — I10 Essential (primary) hypertension: Secondary | ICD-10-CM

## 2017-10-08 NOTE — Telephone Encounter (Signed)
refill Last Refill:07/07/17 #90 zero refills Last OV:07/07/17 PCP: Dr. Nyoka Cowden [facility] [address]

## 2017-11-03 DIAGNOSIS — K5130 Ulcerative (chronic) rectosigmoiditis without complications: Secondary | ICD-10-CM | POA: Diagnosis not present

## 2017-11-30 DIAGNOSIS — K5130 Ulcerative (chronic) rectosigmoiditis without complications: Secondary | ICD-10-CM | POA: Diagnosis not present

## 2017-12-15 ENCOUNTER — Other Ambulatory Visit: Payer: Self-pay | Admitting: Family Medicine

## 2017-12-15 DIAGNOSIS — K219 Gastro-esophageal reflux disease without esophagitis: Secondary | ICD-10-CM

## 2017-12-15 NOTE — Telephone Encounter (Signed)
Requested Prescriptions  Pending Prescriptions Disp Refills  . omeprazole (PRILOSEC) 40 MG capsule [Pharmacy Med Name: OMEPRAZOLE DR 40 MG CAPSULE] 90 capsule 0    Sig: TAKE 1 CAPSULE BY MOUTH EVERY DAY     Gastroenterology: Proton Pump Inhibitors Passed - 12/15/2017  1:59 AM      Passed - Valid encounter within last 12 months    Recent Outpatient Visits          5 months ago Hyperlipidemia, unspecified hyperlipidemia type   [facility], Ranell Patrick, MD   1 year ago Essential hypertension   [facility], Ranell Patrick, MD   1 year ago Essential hypertension   [facility], Ranell Patrick, MD   2 years ago Essential hypertension   [facility], [facility]   2 years ago Essential hypertension   [facility], Loura Back, MD

## 2017-12-21 ENCOUNTER — Other Ambulatory Visit: Payer: Self-pay | Admitting: Family Medicine

## 2017-12-21 DIAGNOSIS — K219 Gastro-esophageal reflux disease without esophagitis: Secondary | ICD-10-CM

## 2017-12-21 MED ORDER — OMEPRAZOLE 40 MG PO CPDR: DELAYED_RELEASE_CAPSULE | ORAL | 0 refills | Status: DC

## 2017-12-21 NOTE — Telephone Encounter (Signed)
Copied from [place] [id number]. Topic: Quick Communication - Rx Refill/Question >> Dec 21, 2017 10:09 AM Gardiner Ramus wrote: Medication: omeprazole (PRILOSEC) 40 MG capsule [[id number]]   Has the patient contacted their pharmacy? Yes Preferred Pharmacy (with phone number or street name):  Agent: Please be advised that RX refills may take up to 3 business days. We ask that you follow-up with your pharmacy.

## 2017-12-21 NOTE — Telephone Encounter (Signed)
Requested Prescriptions  Pending Prescriptions Disp Refills  . omeprazole (PRILOSEC) 40 MG capsule      Sig: TAKE 1 CAPSULE BY MOUTH EVERY DAY     Gastroenterology: Proton Pump Inhibitors Passed - 12/21/2017  1:06 PM      Passed - Valid encounter within last 12 months    Recent Outpatient Visits          5 months ago Hyperlipidemia, unspecified hyperlipidemia type   [facility], Ranell Patrick, MD   1 year ago Essential hypertension   [facility], Ranell Patrick, MD   1 year ago Essential hypertension   [facility], Ranell Patrick, MD   2 years ago Essential hypertension   [facility]   2 years ago Essential hypertension   [facility], [facility]

## 2017-12-30 ENCOUNTER — Encounter: Payer: Self-pay | Admitting: Family Medicine

## 2018-01-06 ENCOUNTER — Other Ambulatory Visit: Payer: Self-pay | Admitting: Family Medicine

## 2018-01-06 DIAGNOSIS — I10 Essential (primary) hypertension: Secondary | ICD-10-CM

## 2018-01-06 NOTE — Telephone Encounter (Signed)
Pt's last office visit 07/07/17; no upcoming visits noted; attempted to contact pt at [phone]; received pre-recorded message stating "this number is no longer in service".  Requested Prescriptions  Pending Prescriptions Disp Refills  . amLODipine-benazepril (LOTREL) 10-20 MG capsule [Pharmacy Med Name: AMLODIPINE-BENAZEPRIL 10-20 MG] 30 capsule 0    Sig: TAKE 1 CAPSULE BY MOUTH EVERY DAY     Cardiovascular: CCB + ACEI Combos Failed - 01/06/2018  2:19 AM      Failed - Cr in normal range and within 180 days    Creat  Date Value Ref Range Status  12/28/2015 1.11 0.70 - 1.25 mg/dL Final    Comment:      For patients > or = 71 years of age: The upper reference limit for Creatinine is approximately 13% higher for people identified as African-American.      Creatinine, Ser  Date Value Ref Range Status  06/03/2017 1.00 0.76 - 1.27 mg/dL Final         Failed - K in normal range and within 180 days    Potassium  Date Value Ref Range Status  06/03/2017 4.7 3.5 - 5.2 mmol/L Final         Failed - Last BP in normal range    BP Readings from Last 1 Encounters:  07/07/17 (!) 146/79         Failed - Valid encounter within last 6 months    Recent Outpatient Visits          6 months ago Hyperlipidemia, unspecified hyperlipidemia type   [facility], Ranell Patrick, MD   1 year ago Essential hypertension   [facility], Ranell Patrick, MD   1 year ago Essential hypertension   [facility], Ranell Patrick, MD   2 years ago Essential hypertension   [facility], [facility]   2 years ago Essential hypertension   [facility] [place], MD             Passed - Patient is not pregnant

## 2018-01-06 NOTE — Telephone Encounter (Signed)
Copied from [place] [id number]. Topic: Quick Communication - Rx Refill/Question >> Jan 06, 2018  5:01 PM Waylan Rocher, Lumin L wrote: Medication: triamcinolone cream (KENALOG) 0.1 % (last prescribed by Chelle J, out of script)   Has the patient contacted their pharmacy? Yes.   (Agent: If no, request that the patient contact the pharmacy for the refill.) (Agent: If yes, when and what did the pharmacy advise?) contacted office 6th and 7th on November, no response  Preferred Pharmacy (with phone number or street name): [id number] [address] [id number]: [phone]: [phone] Agent: Please be advised that RX refills may take up to 3 business days. We ask that you follow-up with your pharmacy.

## 2018-01-07 MED ORDER — TRIAMCINOLONE ACETONIDE 0.1 % EX CREA: TOPICAL_CREAM | Freq: Two times a day (BID) | CUTANEOUS | 1 refills | Status: DC | PRN

## 2018-02-05 ENCOUNTER — Telehealth: Payer: Self-pay | Admitting: Family Medicine

## 2018-02-05 DIAGNOSIS — I10 Essential (primary) hypertension: Secondary | ICD-10-CM

## 2018-02-05 NOTE — Telephone Encounter (Signed)
Attempted to call pt on number provided in chart but recording states that number is no longer in service. Pt will need appt for further refills of medication.

## 2018-02-11 MED ORDER — AMLODIPINE BESY-BENAZEPRIL HCL 10-20 MG PO CAPS: 1.0000 | ORAL_CAPSULE | Freq: Every day | ORAL | 0 refills | Status: DC

## 2018-02-11 NOTE — Telephone Encounter (Signed)
Please advise 

## 2018-02-11 NOTE — Addendum Note (Signed)
Addended by: Merri Ray R on: 02/11/2018 11:57 PM   Modules accepted: Orders

## 2018-02-11 NOTE — Telephone Encounter (Signed)
Pt called and stated he had a new phone number and is not sure why the practice did not have it. He took his last amLODipine-benazepril (LOTREL) 10-20 MG capsule Today 02/11/18 and will be going out of town on 02/14/18. He would like to know to if a partial refill can be sent in as it is for blood pressure and he can't be without it. Please advise pt. CB# [phone].  New number is updated in chart

## 2018-02-11 NOTE — Telephone Encounter (Signed)
One additional 30 day supply granted, but please schedule follow up appointment.

## 2018-02-16 ENCOUNTER — Other Ambulatory Visit: Payer: Self-pay | Admitting: Family Medicine

## 2018-02-16 DIAGNOSIS — I10 Essential (primary) hypertension: Secondary | ICD-10-CM

## 2018-02-16 NOTE — Telephone Encounter (Signed)
Request for amlodipine-benazepril;last office visit 07/07/17; no upcoming visits noted; last refill 02/11/18; refill note states " office visit needed for additional refills"; left message on voicemail [phone]; will route to office for final disposition.  Requested medication (s) are due for refill today: N  Requested medication (s) are on the active medication list: yes  Last refill:  02/11/18  Future visit scheduled: no  Notes to clinic:  Left message on voicemail for pt to call office; must schedule appointment

## 2018-02-19 NOTE — Telephone Encounter (Signed)
Pt returned call. Pt is scheduled for PCP next available. Advised pt that a refill has been granted.

## 2018-03-15 ENCOUNTER — Telehealth: Payer: Self-pay | Admitting: Family Medicine

## 2018-03-15 ENCOUNTER — Other Ambulatory Visit: Payer: Self-pay | Admitting: *Deleted

## 2018-03-15 DIAGNOSIS — I10 Essential (primary) hypertension: Secondary | ICD-10-CM

## 2018-03-15 MED ORDER — AMLODIPINE BESY-BENAZEPRIL HCL 10-20 MG PO CAPS: 1.0000 | ORAL_CAPSULE | Freq: Every day | ORAL | 0 refills | Status: DC

## 2018-03-15 NOTE — Telephone Encounter (Signed)
Copied from [place] [id number]. Topic: Quick Communication - See Telephone Encounter >> Mar 15, 2018 11:28 AM Conception Chancy, NT wrote: CRM for notification. See Telephone encounter for: 03/15/18.  Patient is calling and requesting a refill on amLODipine-benazepril (LOTREL) 10-20 MG capsule. Patient states when he called in December he was told that Dr. Carlota Raspberry refilled this for a year. Please advise. Patient has appointment on 03/17/18 but states he  is out and needing it today.  [id number] [id number]: [phone]: [phone]

## 2018-03-16 ENCOUNTER — Other Ambulatory Visit: Payer: Self-pay | Admitting: Family Medicine

## 2018-03-16 DIAGNOSIS — K219 Gastro-esophageal reflux disease without esophagitis: Secondary | ICD-10-CM

## 2018-03-16 NOTE — Telephone Encounter (Signed)
Requested Prescriptions  Pending Prescriptions Disp Refills  . omeprazole (PRILOSEC) 40 MG capsule [Pharmacy Med Name: OMEPRAZOLE DR 40 MG CAPSULE] 30 capsule 2    Sig: TAKE 1 CAPSULE BY MOUTH EVERY DAY     Gastroenterology: Proton Pump Inhibitors Passed - 03/16/2018  1:13 AM      Passed - Valid encounter within last 12 months    Recent Outpatient Visits          8 months ago Hyperlipidemia, unspecified hyperlipidemia type   [facility], Ranell Patrick, MD   1 year ago Essential hypertension   [facility], Ranell Patrick, MD   1 year ago Essential hypertension   [facility], Ranell Patrick, MD   2 years ago Essential hypertension   [facility], [facility]   3 years ago Essential hypertension   [facility], Loura Back, MD      Future Appointments            Tomorrow Wendie Agreste, MD [facility] [place], [facility]

## 2018-03-17 ENCOUNTER — Encounter: Payer: Self-pay | Admitting: Family Medicine

## 2018-03-17 ENCOUNTER — Other Ambulatory Visit: Payer: Self-pay

## 2018-03-17 ENCOUNTER — Ambulatory Visit (INDEPENDENT_AMBULATORY_CARE_PROVIDER_SITE_OTHER): Payer: Medicare Other | Admitting: Family Medicine

## 2018-03-17 ENCOUNTER — Telehealth: Payer: Self-pay | Admitting: Family Medicine

## 2018-03-17 VITALS — BP 125/78 | HR 54 | Temp 98.5°F | Resp 16 | Ht 73.0 in | Wt 214.0 lb

## 2018-03-17 DIAGNOSIS — I10 Essential (primary) hypertension: Secondary | ICD-10-CM | POA: Diagnosis not present

## 2018-03-17 DIAGNOSIS — E785 Hyperlipidemia, unspecified: Secondary | ICD-10-CM | POA: Diagnosis not present

## 2018-03-17 DIAGNOSIS — R739 Hyperglycemia, unspecified: Secondary | ICD-10-CM

## 2018-03-17 DIAGNOSIS — F439 Reaction to severe stress, unspecified: Secondary | ICD-10-CM | POA: Diagnosis not present

## 2018-03-17 DIAGNOSIS — K219 Gastro-esophageal reflux disease without esophagitis: Secondary | ICD-10-CM | POA: Diagnosis not present

## 2018-03-17 DIAGNOSIS — Z1329 Encounter for screening for other suspected endocrine disorder: Secondary | ICD-10-CM | POA: Diagnosis not present

## 2018-03-17 DIAGNOSIS — I480 Paroxysmal atrial fibrillation: Secondary | ICD-10-CM | POA: Diagnosis not present

## 2018-03-17 MED ORDER — PRAVASTATIN SODIUM 40 MG PO TABS: 40.0000 mg | ORAL_TABLET | Freq: Every day | ORAL | 2 refills | Status: DC

## 2018-03-17 MED ORDER — LORAZEPAM 0.5 MG PO TABS: 0.5000 mg | ORAL_TABLET | ORAL | 1 refills | Status: DC | PRN

## 2018-03-17 MED ORDER — METOPROLOL SUCCINATE ER 50 MG PO TB24: ORAL_TABLET | ORAL | 2 refills | Status: DC

## 2018-03-17 MED ORDER — AMLODIPINE BESY-BENAZEPRIL HCL 10-20 MG PO CAPS: 1.0000 | ORAL_CAPSULE | Freq: Every day | ORAL | 2 refills | Status: DC

## 2018-03-17 NOTE — Progress Notes (Signed)
Subjective:    Patient ID: Joseph Smith, male    DOB: 07-14-1945, 73 y.o.   MRN: [id number]  Chief Complaint  Patient presents with  . Hypertension    need refill on amlodipine  . Anxiety    need refill   . Gastroesophageal Reflux    need refill on pravastatin and omeprazole    HPI Joseph Smith is a 73 y.o. male who is here at [place] at [facility] to follow up on HTN, anxiety and GERD.  HTN He is on Lotrel 10/20 mg, metoprolol 50 mg QD. He was not able to tolerate one of the brand names for amlodipine.   BP Readings from Last 3 Encounters:  03/17/18 125/78  07/07/17 (!) 146/79  06/03/17 110/64   Lab Results  Component Value Date   CREATININE 1.00 06/03/2017   HLD He is on Pravachol 40 mg QD. He is compliant. Lab Results  Component Value Date   CHOL 179 06/03/2017   HDL 67 06/03/2017   LDLCALC 92 06/03/2017   TRIG 100 06/03/2017   CHOLHDL 2.7 06/03/2017   Lab Results  Component Value Date   ALT 43 06/03/2017   AST 34 06/03/2017   ALKPHOS 62 06/03/2017   BILITOT 0.6 06/03/2017    Atrial Fibrillation He is on Eliquis for anticoagulation, and metoprolol for rate control.  His cardiologist is Dr. Stanford Breed. He is tolerating medications well, but noticed delayed wound healing and prolonged bleeding with Eliquis. He usually applied pressure to stop the bleeding and denies hematuria or hematochezia.   GERD He is on omeprazole 40 mg daily as needed.  Situational anxiety/stress Intermittent with some insomnia in the past with infrequent use of Ativan PDMP reviewed. Last prescription for #30 filled 5//14/2019. He only uses Ativan as needed, no more than a few days in a row.   Hyperglycemia BG 108 05/2017 down from 132 previously.  Wt Readings from Last 3 Encounters:  03/17/18 214 lb (97.1 kg)  07/07/17 216 lb (98 kg)  06/03/17 217 lb 6.4 oz (98.6 kg)   Ulcerative Colitis Treated with Lialda oral and PR. GI is [facility] Dr. Acquanetta Sit. He is tolerating  treatment well.     Patient Active Problem List   Diagnosis Date Noted  . Ulcerative colitis ([place]) 05/29/2016  . Bruit 12/03/2012  . Hyperglycemia 02/05/2011  . Impotence due to erectile dysfunction 02/05/2011  . ATRIAL FIBRILLATION 05/10/2010  . PALPITATIONS 04/23/2010  . CHEST PAIN-UNSPECIFIED 04/17/2009  . Hyperlipidemia 04/11/2009  . Essential hypertension 04/11/2009  . GERD 04/11/2009   Past Medical History:  Diagnosis Date  . Atrial fibrillation ([place])   . Chest pain, unspecified   . Esophageal reflux   . Family history of ischemic heart disease   . Other and unspecified hyperlipidemia   . Palpitations   . Ulcerative colitis   . Unspecified essential hypertension    Past Surgical History:  Procedure Laterality Date  . ELBOW SURGERY     left  . KNEE ARTHROSCOPY     rihgt  . VASECTOMY     Allergies  Allergen Reactions  . Codeine Itching    REACTION: Reaction not known   Prior to Admission medications   Medication Sig Start Date End Date Taking? Authorizing Provider  amLODipine-benazepril (LOTREL) 10-20 MG capsule Take 1 capsule by mouth daily. 03/15/18  Yes Wendie Agreste, MD  apixaban (ELIQUIS) 5 MG TABS tablet Take 1 tablet (5 mg total) by mouth 2 (two) times daily. 09/01/17  Yes Lelon Perla, MD  LORazepam (ATIVAN) 0.5 MG tablet Take 1 tablet (0.5 mg total) by mouth as needed. 07/07/17  Yes Wendie Agreste, MD  mesalamine (LIALDA) 1.2 g EC tablet Take 2 tablets (2.4 g total) by mouth daily. 12/08/16  Yes Wendie Agreste, MD  metoprolol succinate (TOPROL-XL) 50 MG 24 hr tablet TAKE 1 TABLET BY MOUTH EVERY DAY 07/07/17  Yes Wendie Agreste, MD  omeprazole (PRILOSEC) 40 MG capsule TAKE 1 CAPSULE BY MOUTH EVERY DAY 03/16/18  Yes Wendie Agreste, MD  pravastatin (PRAVACHOL) 40 MG tablet Take 1 tablet (40 mg total) by mouth daily. 07/07/17  Yes Wendie Agreste, MD  triamcinolone cream (KENALOG) 0.1 % Apply topically 2 (two) times daily as needed. 01/07/18   Yes Wendie Agreste, MD   Social History   Socioeconomic History  . Marital status: Single    Spouse name: Not on file  . Number of children: Not on file  . Years of education: Not on file  . Highest education level: Not on file  Occupational History  . Not on file  Social Needs  . Financial resource strain: Not on file  . Food insecurity:    Worry: Not on file    Inability: Not on file  . Transportation needs:    Medical: Not on file    Non-medical: Not on file  Tobacco Use  . Smoking status: Former Smoker    Years: 25.00    Types: Cigarettes, Cigars  . Smokeless tobacco: Never Used  Substance and Sexual Activity  . Alcohol use: Yes    Alcohol/week: 0.0 standard drinks  . Drug use: No  . Sexual activity: Yes  Lifestyle  . Physical activity:    Days per week: Not on file    Minutes per session: Not on file  . Stress: Not on file  Relationships  . Social connections:    Talks on phone: Not on file    Gets together: Not on file    Attends religious service: Not on file    Active member of club or organization: Not on file    Attends meetings of clubs or organizations: Not on file    Relationship status: Not on file  . Intimate partner violence:    Fear of current or ex partner: Not on file    Emotionally abused: Not on file    Physically abused: Not on file    Forced sexual activity: Not on file  Other Topics Concern  . Not on file  Social History Narrative   Married. Education: [employer]. Exercise: Weekly.    Review of Systems As HPI    Objective:    Physical Exam  Constitutional: He is oriented to person, place, and time. He appears well-developed and well-nourished.  HENT:  Head: Normocephalic and atraumatic.  Eyes: Pupils are equal, round, and reactive to light. EOM are normal.  Neck: No JVD present. Carotid bruit is not present.  Cardiovascular: Normal rate, regular rhythm and normal heart sounds.  No murmur heard. Pulmonary/Chest: Effort normal  and breath sounds normal. He has no rales.  Musculoskeletal:        General: No edema.  Neurological: He is alert and oriented to person, place, and time.  Skin: Skin is warm and dry.  Psychiatric: He has a normal mood and affect.  Vitals reviewed.  Vitals:   03/17/18 1455  BP: 125/78  Pulse: (!) 54  Resp: 16  Temp: 98.5 F (36.9 C)  TempSrc: Oral  SpO2: 98%  Weight: 214 lb (97.1 kg)  Height: 6' 1"  (1.854 m)       Assessment & Plan:   Joseph Smith is a 73 y.o. male Stress - Plan: LORazepam (ATIVAN) 0.5 MG tablet  - stable. Continue prn ativan, intermittent need.   Essential hypertension - Plan: metoprolol succinate (TOPROL-XL) 50 MG 24 hr tablet, amLODipine-benazepril (LOTREL) 10-20 MG capsule Screening for thyroid disorder - Plan: TSH  -  Stable, tolerating current regimen. Medications refilled. Labs pending as above.   Gastroesophageal reflux disease, esophagitis presence not specified  - stable. No med changes.   Hyperlipidemia, unspecified hyperlipidemia type - Plan: pravastatin (PRAVACHOL) 40 MG tablet, Comprehensive metabolic panel, Lipid panel   Stable, tolerating current regimen. Medications refilled. Labs pending as above.   PAF (paroxysmal atrial fibrillation) ([place]) - Plan: TSH  - followed by cardiology and stable. Check TSH.   Hyperglycemia - Plan: Hemoglobin A1c for DM screening.    Meds ordered this encounter  Medications  . LORazepam (ATIVAN) 0.5 MG tablet    Sig: Take 1 tablet (0.5 mg total) by mouth as needed.    Dispense:  30 tablet    Refill:  1    Not to exceed 4 additional fills before 12/03/2016  . metoprolol succinate (TOPROL-XL) 50 MG 24 hr tablet    Sig: TAKE 1 TABLET BY MOUTH EVERY DAY    Dispense:  90 tablet    Refill:  2  . pravastatin (PRAVACHOL) 40 MG tablet    Sig: Take 1 tablet (40 mg total) by mouth daily.    Dispense:  90 tablet    Refill:  2  . amLODipine-benazepril (LOTREL) 10-20 MG capsule    Sig: Take 1 capsule by  mouth daily.    Dispense:  90 capsule    Refill:  2   Patient Instructions    Thanks for coming in today. No med changes at this time.   Wellness exam in 6 months.    If you have lab work done today you will be contacted with your lab results within the next 2 weeks.  If you have not heard from [place] then please contact us. The fastest way to get your results is to register for My Chart.   IF you received an x-ray today, you will receive an invoice from [facility]. Please contact [facility] at [phone] with questions or concerns regarding your invoice.   IF you received labwork today, you will receive an invoice from [place]. Please contact [facility] at [phone] with questions or concerns regarding your invoice.   Our billing staff will not be able to assist you with questions regarding bills from these companies.  You will be contacted with the lab results as soon as they are available. The fastest way to get your results is to activate your My Chart account. Instructions are located on the last page of this paperwork. If you have not heard from [place] regarding the results in 2 weeks, please contact this office.         Dierdre Searles Dweik am acting as scribe for Dr. Merri Ray. I personally performed the services described in this documentation, which was scribed in my presence. The recorded information has been reviewed and considered for accuracy and completeness, addended by me as needed, and agree with information above.  Signed,   Merri Ray, MD [facility] at [place].  03/19/18 10:29 PM

## 2018-03-17 NOTE — Telephone Encounter (Signed)
03/17/2018 - PATIENT SEEN BY DR. Carlota Raspberry ON 03/17/2018. HE HAS REQUESTED PATIENT TO HAVE AN ANNUAL MEDICARE WELLNESS EXAM IN 6 MONTHS. BEST PHONE [phone] (CELL) [place]

## 2018-03-17 NOTE — Patient Instructions (Addendum)
  Thanks for coming in today. No med changes at this time.   Wellness exam in 6 months.    If you have lab work done today you will be contacted with your lab results within the next 2 weeks.  If you have not heard from [place] then please contact us. The fastest way to get your results is to register for My Chart.   IF you received an x-ray today, you will receive an invoice from [facility]iology. Please contact [facility] Radiology at [phone] with questions or concerns regarding your invoice.   IF you received labwork today, you will receive an invoice from [facility]. Please contact [facility] at [phone] with questions or concerns regarding your invoice.   Our billing staff will not be able to assist you with questions regarding bills from these companies.  You will be contacted with the lab results as soon as they are available. The fastest way to get your results is to activate your My Chart account. Instructions are located on the last page of this paperwork. If you have not heard from [place] regarding the results in 2 weeks, please contact this office.

## 2018-03-18 LAB — LIPID PANEL
Chol/HDL Ratio: 2.5 ratio (ref 0.0–5.0)
Cholesterol, Total: 179 mg/dL (ref 100–199)
HDL: 73 mg/dL (ref >39)
LDL Calculated: 85 mg/dL (ref 0–99)
Triglycerides: 104 mg/dL (ref 0–149)
VLDL Cholesterol Cal: 21 mg/dL (ref 5–40)

## 2018-03-18 LAB — COMPREHENSIVE METABOLIC PANEL
ALT: 17 IU/L (ref 0–44)
AST: 19 IU/L (ref 0–40)
Albumin/Globulin Ratio: 2.1 (ref 1.2–2.2)
Albumin: 4.7 g/dL (ref 3.7–4.7)
Alkaline Phosphatase: 50 IU/L (ref 39–117)
BUN/Creatinine Ratio: 16 (ref 10–24)
BUN: 15 mg/dL (ref 8–27)
Bilirubin Total: 0.4 mg/dL (ref 0.0–1.2)
CO2: 23 mmol/L (ref 20–29)
Calcium: 9.4 mg/dL (ref 8.6–10.2)
Chloride: 98 mmol/L (ref 96–106)
Creatinine, Ser: 0.95 mg/dL (ref 0.76–1.27)
GFR calc Af Amer: 93 mL/min/{1.73_m2} (ref >59)
GFR calc non Af Amer: 80 mL/min/{1.73_m2} (ref >59)
Globulin, Total: 2.2 g/dL (ref 1.5–4.5)
Glucose: 103 mg/dL — ABNORMAL HIGH (ref 65–99)
Potassium: 4.5 mmol/L (ref 3.5–5.2)
Sodium: 137 mmol/L (ref 134–144)
Total Protein: 6.9 g/dL (ref 6.0–8.5)

## 2018-03-18 LAB — HEMOGLOBIN A1C
Est. average glucose Bld gHb Est-mCnc: 111 mg/dL
Hgb A1c MFr Bld: 5.5 % (ref 4.8–5.6)

## 2018-03-18 LAB — TSH: TSH: 2.66 u[IU]/mL (ref 0.450–4.500)

## 2018-03-22 ENCOUNTER — Other Ambulatory Visit: Payer: Self-pay | Admitting: Cardiology

## 2018-03-22 DIAGNOSIS — I480 Paroxysmal atrial fibrillation: Secondary | ICD-10-CM

## 2018-03-22 NOTE — Telephone Encounter (Signed)
Pt is a 73 yr male who saw Dr. Stanford Breed on 06/03/17. Last SCr on 03/17/18 was 0.95,  Weight on that visit was 97.1Kg. Will refill Eliquis 59m BID.

## 2018-03-22 NOTE — Telephone Encounter (Signed)
Please review for refill. Thanks!  

## 2018-04-01 ENCOUNTER — Ambulatory Visit (INDEPENDENT_AMBULATORY_CARE_PROVIDER_SITE_OTHER): Payer: BLUE CROSS/BLUE SHIELD | Admitting: Family Medicine

## 2018-04-01 VITALS — BP 120/75 | Ht 73.25 in | Wt 215.4 lb

## 2018-04-01 DIAGNOSIS — Z0000 Encounter for general adult medical examination without abnormal findings: Secondary | ICD-10-CM | POA: Diagnosis not present

## 2018-04-01 NOTE — Progress Notes (Addendum)
Presents today for [employer] Visit   Immunization status:  Immunization History  Administered Date(s) Administered  . Influenza Split 02/04/2011, 11/30/2011  . Influenza, High Dose Seasonal PF 12/30/2017  . Influenza,inj,Quad PF,6+ Mos 01/12/2014, 01/09/2015, 12/08/2016  . Pneumococcal Conjugate-13 07/13/2014  . Pneumococcal Polysaccharide-23 07/19/2015  . Td 12/21/2007     Health Maintenance Due  Topic Date Due  . Samul Dada  12/20/2017     Functional Status Survey: Is the patient deaf or have difficulty hearing?: Yes Does the patient have difficulty seeing, even when wearing glasses/contacts?: No Does the patient have difficulty concentrating, remembering, or making decisions?: No Does the patient have difficulty walking or climbing stairs?: No Does the patient have difficulty dressing or bathing?: No Does the patient have difficulty doing errands alone such as visiting a doctor's office or shopping?: No   6CIT Screen 04/01/2018  What Year? 0 points  What month? 0 points  What time? 0 points  Count back from 20 0 points  Months in reverse 0 points  Repeat phrase 0 points  Total Score 0        Clinical Support from 04/01/2018 in Primary Care at [facility]  AUDIT-C Score  8       Patient Active Problem List   Diagnosis Date Noted  . Ulcerative colitis ([place]) 05/29/2016  . Bruit 12/03/2012  . Hyperglycemia 02/05/2011  . Impotence due to erectile dysfunction 02/05/2011  . ATRIAL FIBRILLATION 05/10/2010  . PALPITATIONS 04/23/2010  . CHEST PAIN-UNSPECIFIED 04/17/2009  . Hyperlipidemia 04/11/2009  . Essential hypertension 04/11/2009  . GERD 04/11/2009     Past Medical History:  Diagnosis Date  . Atrial fibrillation ([place])   . Chest pain, unspecified   . Esophageal reflux   . Family history of ischemic heart disease   . Other and unspecified hyperlipidemia   . Palpitations   . Ulcerative colitis   . Unspecified essential hypertension       Past Surgical History:  Procedure Laterality Date  . ELBOW SURGERY     left  . KNEE ARTHROSCOPY     rihgt  . VASECTOMY       Family History  Problem Relation Age of Onset  . Stroke Brother   . Coronary artery disease Other        family history     Social History   Socioeconomic History  . Marital status: Single    Spouse name: Not on file  . Number of children: Not on file  . Years of education: Not on file  . Highest education level: Not on file  Occupational History  . Not on file  Social Needs  . Financial resource strain: Not on file  . Food insecurity:    Worry: Not on file    Inability: Not on file  . Transportation needs:    Medical: Not on file    Non-medical: Not on file  Tobacco Use  . Smoking status: Former Smoker    Years: 25.00    Types: Cigarettes, Cigars  . Smokeless tobacco: Never Used  Substance and Sexual Activity  . Alcohol use: Yes    Alcohol/week: 0.0 standard drinks  . Drug use: No  . Sexual activity: Yes  Lifestyle  . Physical activity:    Days per week: Not on file    Minutes per session: Not on file  . Stress: Not on file  Relationships  . Social connections:    Talks on phone: Not on file  Gets together: Not on file    Attends religious service: Not on file    Active member of club or organization: Not on file    Attends meetings of clubs or organizations: Not on file    Relationship status: Not on file  . Intimate partner violence:    Fear of current or ex partner: Not on file    Emotionally abused: Not on file    Physically abused: Not on file    Forced sexual activity: Not on file  Other Topics Concern  . Not on file  Social History Narrative   Married. Education: [employer]. Exercise: Weekly.     Allergies  Allergen Reactions  . Codeine Itching    REACTION: Reaction not known     Prior to Admission medications   Medication Sig Start Date End Date Taking? Authorizing Provider  amLODipine-benazepril  (LOTREL) 10-20 MG capsule Take 1 capsule by mouth daily. 03/17/18  Yes Wendie Agreste, MD  ELIQUIS 5 MG TABS tablet TAKE 1 TABLET BY MOUTH TWICE DAILY 03/22/18  Yes Lelon Perla, MD  LORazepam (ATIVAN) 0.5 MG tablet Take 1 tablet (0.5 mg total) by mouth as needed. 03/17/18  Yes Wendie Agreste, MD  mesalamine (LIALDA) 1.2 g EC tablet Take 2 tablets (2.4 g total) by mouth daily. 12/08/16  Yes Wendie Agreste, MD  metoprolol succinate (TOPROL-XL) 50 MG 24 hr tablet TAKE 1 TABLET BY MOUTH EVERY DAY 03/17/18  Yes Wendie Agreste, MD  omeprazole (PRILOSEC) 40 MG capsule TAKE 1 CAPSULE BY MOUTH EVERY DAY 03/16/18  Yes Wendie Agreste, MD  pravastatin (PRAVACHOL) 40 MG tablet Take 1 tablet (40 mg total) by mouth daily. 03/17/18  Yes Wendie Agreste, MD  triamcinolone cream (KENALOG) 0.1 % Apply topically 2 (two) times daily as needed. 01/07/18  Yes Wendie Agreste, MD     Depression screen [facility] 2/9 04/01/2018 03/17/2018 07/07/2017 12/08/2016 05/29/2016  Decreased Interest 0 0 0 0 0  Down, Depressed, Hopeless 0 0 0 0 0  PHQ - 2 Score 0 0 0 0 0     Fall Risk  04/01/2018 03/17/2018 07/07/2017 12/08/2016 05/29/2016  Falls in the past year? 0 0 No No No  Comment - - - - -  Number falls in past yr: 0 - - - -  Injury with Fall? 0 - - - -  Follow up Falls evaluation completed;Education provided;Falls prevention discussed - - - -      PHYSICAL EXAM: BP 120/75   Ht 6' 1.25" (1.861 m)   Wt 215 lb 6.4 oz (97.7 kg)   BMI 28.22 kg/m    Wt Readings from Last 3 Encounters:  04/01/18 215 lb 6.4 oz (97.7 kg)  03/17/18 214 lb (97.1 kg)  07/07/17 216 lb (98 kg)     No exam data present    Physical Exam   Education/Counseling provided regarding diet and exercise, prevention of chronic diseases, smoking/tobacco cessation, if applicable, and reviewed "Covered Medicare Preventive Services."   ASSESSMENT/PLAN:     Hearing aids received hearing a year ago.  [employer] no problems  [place]:  opthalmology  Sees once a year  Exercise:  Walk every 1/2 hour, yard work.  Home free of throw rugs/extention cords  Up to date  Colonoscopy All immunizations  House well lit.   One story home lives with wife and dog.  Breakfast: eggs,ceral fruit,  Lunch: fruit Dinner:  Meat and vegtables  Drinks water during day.  I have reviewed and agree with the above AWV documentation. [place]. Reubin Milan, MD

## 2018-04-05 ENCOUNTER — Other Ambulatory Visit: Payer: Self-pay | Admitting: Cardiology

## 2018-04-05 DIAGNOSIS — I10 Essential (primary) hypertension: Secondary | ICD-10-CM

## 2018-04-05 NOTE — Telephone Encounter (Signed)
°*  STAT* If patient is at the pharmacy, call can be transferred to refill team.   1. Which medications need to be refilled? (please list name of each medication and dose if known) metoprolol succinate (TOPROL-XL) 50 MG 24 hr tablet  2. Which pharmacy/location (including street and city if local pharmacy) is medication to be sent to? [id number]  3. Do they need a 30 day or 90 day supply? [place]

## 2018-04-06 MED ORDER — METOPROLOL SUCCINATE ER 50 MG PO TB24: ORAL_TABLET | ORAL | 0 refills | Status: DC

## 2018-06-14 ENCOUNTER — Other Ambulatory Visit: Payer: Self-pay | Admitting: Family Medicine

## 2018-06-14 DIAGNOSIS — K219 Gastro-esophageal reflux disease without esophagitis: Secondary | ICD-10-CM

## 2018-07-06 ENCOUNTER — Ambulatory Visit: Payer: Medicare Other | Admitting: Cardiology

## 2018-09-05 ENCOUNTER — Other Ambulatory Visit: Payer: Self-pay | Admitting: Family Medicine

## 2018-09-05 DIAGNOSIS — I10 Essential (primary) hypertension: Secondary | ICD-10-CM

## 2018-09-06 ENCOUNTER — Telehealth: Payer: Self-pay | Admitting: Family Medicine

## 2018-09-06 ENCOUNTER — Other Ambulatory Visit: Payer: Self-pay | Admitting: Emergency Medicine

## 2018-09-06 ENCOUNTER — Other Ambulatory Visit: Payer: Self-pay | Admitting: Family Medicine

## 2018-09-06 DIAGNOSIS — K219 Gastro-esophageal reflux disease without esophagitis: Secondary | ICD-10-CM

## 2018-09-06 DIAGNOSIS — I10 Essential (primary) hypertension: Secondary | ICD-10-CM

## 2018-09-06 MED ORDER — AMLODIPINE BESY-BENAZEPRIL HCL 10-20 MG PO CAPS: 1.0000 | ORAL_CAPSULE | Freq: Every day | ORAL | 2 refills | Status: DC

## 2018-09-06 NOTE — Telephone Encounter (Signed)
Patient stated he needs to have a refill on amlodipine-benaz 10/6m capsules  Patient states he needs this refilled Today because he is headed out of town   [place] .

## 2018-09-14 ENCOUNTER — Other Ambulatory Visit: Payer: Self-pay | Admitting: *Deleted

## 2018-09-14 DIAGNOSIS — I10 Essential (primary) hypertension: Secondary | ICD-10-CM

## 2018-09-14 MED ORDER — METOPROLOL SUCCINATE ER 50 MG PO TB24: ORAL_TABLET | ORAL | 0 refills | Status: DC

## 2018-09-14 NOTE — Telephone Encounter (Signed)
Requested Prescriptions   Signed Prescriptions Disp Refills  . metoprolol succinate (TOPROL-XL) 50 MG 24 hr tablet 90 tablet 0    Sig: TAKE 1 TABLET BY MOUTH EVERY DAY    Authorizing Provider: Lelon Perla    Ordering User: Britt Bottom

## 2018-09-15 ENCOUNTER — Other Ambulatory Visit: Payer: Self-pay | Admitting: Pharmacist Clinician (PhC)/ Clinical Pharmacy Specialist

## 2018-09-15 DIAGNOSIS — I480 Paroxysmal atrial fibrillation: Secondary | ICD-10-CM

## 2018-09-15 MED ORDER — APIXABAN 5 MG PO TABS: 5.0000 mg | ORAL_TABLET | Freq: Two times a day (BID) | ORAL | 0 refills | Status: DC

## 2018-09-15 NOTE — Telephone Encounter (Signed)
72 M 97.7 kg SCr 0.95.  CrCl 98.6

## 2018-09-21 ENCOUNTER — Other Ambulatory Visit: Payer: Self-pay | Admitting: Family Medicine

## 2018-09-21 DIAGNOSIS — F439 Reaction to severe stress, unspecified: Secondary | ICD-10-CM

## 2018-09-21 NOTE — Telephone Encounter (Signed)
Requested medications are due for refill today?  Yes  Requested medications are on the active medication list?  Yes  Last refill 03/17/2018  Future visit scheduled?  Yes - 09/27/2018  Notes to clinic   Requested Prescriptions  Pending Prescriptions Disp Refills   LORazepam (ATIVAN) 0.5 MG tablet [Pharmacy Med Name: LORAZEPAM 0.5MG TABLETS] 30 tablet     Sig: TAKE 1 TABLET BY MOUTH AS NEEDED     Not Delegated - Psychiatry:  Anxiolytics/Hypnotics Failed - 09/21/2018  3:46 AM      Failed - This refill cannot be delegated      Failed - Urine Drug Screen completed in last 360 days.      Passed - Valid encounter within last 6 months    Recent Outpatient Visits          5 months ago Medicare annual wellness visit, subsequent   [facility], Lilia Argue, MD   6 months ago Screening for thyroid disorder   [facility], Ranell Patrick, MD   1 year ago Hyperlipidemia, unspecified hyperlipidemia type   [facility], Ranell Patrick, MD   1 year ago Essential hypertension   [facility], Ranell Patrick, MD   2 years ago Essential hypertension   [facility], Ranell Patrick, MD      Future Appointments            In 6 days Wendie Agreste, MD [facility] at [place], [facility]   In 2 weeks [place], Denice Bors, MD [place], [facility]

## 2018-09-27 ENCOUNTER — Encounter: Payer: Self-pay | Admitting: Family Medicine

## 2018-09-27 ENCOUNTER — Other Ambulatory Visit: Payer: Self-pay

## 2018-09-27 ENCOUNTER — Ambulatory Visit (INDEPENDENT_AMBULATORY_CARE_PROVIDER_SITE_OTHER): Payer: BC Managed Care – PPO | Admitting: Family Medicine

## 2018-09-27 VITALS — BP 130/72 | HR 60 | Temp 98.5°F | Ht 73.0 in | Wt 217.2 lb

## 2018-09-27 DIAGNOSIS — K51011 Ulcerative (chronic) pancolitis with rectal bleeding: Secondary | ICD-10-CM

## 2018-09-27 DIAGNOSIS — R609 Edema, unspecified: Secondary | ICD-10-CM | POA: Diagnosis not present

## 2018-09-27 DIAGNOSIS — E785 Hyperlipidemia, unspecified: Secondary | ICD-10-CM

## 2018-09-27 DIAGNOSIS — I10 Essential (primary) hypertension: Secondary | ICD-10-CM

## 2018-09-27 MED ORDER — MESALAMINE 1.2 G PO TBEC: 2.4000 g | DELAYED_RELEASE_TABLET | Freq: Every day | ORAL | 2 refills | Status: DC

## 2018-09-27 MED ORDER — METOPROLOL SUCCINATE ER 50 MG PO TB24: ORAL_TABLET | ORAL | 2 refills | Status: DC

## 2018-09-27 MED ORDER — AMLODIPINE BESY-BENAZEPRIL HCL 10-20 MG PO CAPS: 1.0000 | ORAL_CAPSULE | Freq: Every day | ORAL | 2 refills | Status: DC

## 2018-09-27 MED ORDER — PRAVASTATIN SODIUM 40 MG PO TABS: 40.0000 mg | ORAL_TABLET | Freq: Every day | ORAL | 2 refills | Status: DC

## 2018-09-27 NOTE — Patient Instructions (Addendum)
Ankle swelling may be due to amlodipine. You are at max dose of the amlodipine in your Lotrel combo pill .  One option is lower dose of amlodipine, higher dose of benazepril (5/40), or just try lower dose of amlodipine (5/20).   No med changes for now.   Follow up in 6 months for wellness exam. Thanks for coming in today and stay safe.    Peripheral Edema  Peripheral edema is swelling that is caused by a buildup of fluid. Peripheral edema most often affects the lower legs, ankles, and feet. It can also develop in the arms, hands, and face. The area of the body that has peripheral edema will look swollen. It may also feel heavy or warm. Your clothes may start to feel tight. Pressing on the area may make a temporary dent in your skin. You may not be able to move your swollen arm or leg as much as usual. There are many causes of peripheral edema. It can happen because of a complication of other conditions such as congestive heart failure, kidney disease, or a problem with your blood circulation. It also can be a side effect of certain medicines or because of an infection. It often happens to women during pregnancy. Sometimes, the cause is not known. Follow these instructions at home: Managing pain, stiffness, and swelling   Raise (elevate) your legs while you are sitting or lying down.  Move around often to prevent stiffness and to lessen swelling.  Do not sit or stand for long periods of time.  Wear support stockings as told by your health care provider. Medicines  Take over-the-counter and prescription medicines only as told by your health care provider.  Your health care provider may prescribe medicine to help your body get rid of excess water (diuretic). General instructions  Pay attention to any changes in your symptoms.  Follow instructions from your health care provider about limiting salt (sodium) in your diet. Sometimes, eating less salt may reduce swelling.  Moisturize skin  daily to help prevent skin from cracking and draining.  Keep all follow-up visits as told by your health care provider. This is important. Contact a health care provider if you have:  A fever.  Edema that starts suddenly or is getting worse, especially if you are pregnant or have a medical condition.  Swelling in only one leg.  Increased swelling, redness, or pain in one or both of your legs.  Drainage or sores at the area where you have edema. Get help right away if you:  Develop shortness of breath, especially when you are lying down.  Have pain in your chest or abdomen.  Feel weak.  Feel faint. Summary  Peripheral edema is swelling that is caused by a buildup of fluid. Peripheral edema most often affects the lower legs, ankles, and feet.  Move around often to prevent stiffness and to lessen swelling. Do not sit or stand for long periods of time.  Pay attention to any changes in your symptoms.  Contact a health care provider if you have edema that starts suddenly or is getting worse, especially if you are pregnant or have a medical condition.  Get help right away if you develop shortness of breath, especially when lying down. This information is not intended to replace advice given to you by your health care provider. Make sure you discuss any questions you have with your health care provider. Document Released: 03/20/2004 Document Revised: 11/04/2017 Document Reviewed: 11/04/2017 Elsevier Patient Education  2020  [employer].    If you have lab work done today you will be contacted with your lab results within the next 2 weeks.  If you have not heard from [place] then please contact us. The fastest way to get your results is to register for My Chart.   IF you received an x-ray today, you will receive an invoice from [facility]. Please contact [facility] at [phone] with questions or concerns regarding your invoice.   IF you received labwork today,  you will receive an invoice from [facility]. Please contact [facility] at [phone] with questions or concerns regarding your invoice.   Our billing staff will not be able to assist you with questions regarding bills from these companies.  You will be contacted with the lab results as soon as they are available. The fastest way to get your results is to activate your My Chart account. Instructions are located on the last page of this paperwork. If you have not heard from [place] regarding the results in 2 weeks, please contact this office.

## 2018-09-27 NOTE — Progress Notes (Signed)
Subjective:    Patient ID: Joseph Smith, male    DOB: 1945-11-30, 73 y.o.   MRN: [id number]  HPI Joseph Smith is a 73 y.o. male Presents today for: Chief Complaint  Patient presents with  . Medication Refill  . chronic conditions    6 m f/u    Hypertension: BP Readings from Last 3 Encounters:  09/27/18 130/72  04/01/18 120/75  03/17/18 125/78   Lab Results  Component Value Date   CREATININE 0.95 03/17/2018  Lotrel 10/20 mg daily, Toprol-XL 50 mg daily. Has some intermittent ankle swelling. Not daily, and resolves overnight.  No Cp/dyspnea,  No DOE.  No home readings, but 124 /79 at outside offices.  Would like to stay on same meds.   Hyperlipidemia:  Lab Results  Component Value Date   CHOL 179 03/17/2018   HDL 73 03/17/2018   LDLCALC 85 03/17/2018   TRIG 104 03/17/2018   CHOLHDL 2.5 03/17/2018   Lab Results  Component Value Date   ALT 17 03/17/2018   AST 19 03/17/2018   ALKPHOS 50 03/17/2018   BILITOT 0.4 03/17/2018  Pravastatin 40 mg daily.   History of paroxysmal atrial fibrillation Followed by cardiology, on metoprolol as above for rate control, Eliquis 5 mg for anticoagulation. No new bleeding. No palpitations.  Planned cardiology follow up in next month.   Ulcerative colitis: [employer], [employer], Dr. Penelope Coop. Takes Lialda.  Doing well on Lialda as needed. follow up as needed plan with GI.  Bowels moving normally. Has mesalalmine if needed for flair.   Situational anxiety/stress. Intermittent symptoms previously with occasional insomnia, relieved with Ativan.  Only intermittent use of Ativan not weekly.  Controlled substance database (PDMP) reviewed. No concerns appreciated.  Last filled for #30 on 08/25/2018, previously March 17, 2022 #30.   Patient Active Problem List   Diagnosis Date Noted  . Ulcerative colitis ([place]) 05/29/2016  . Bruit 12/03/2012  . Hyperglycemia 02/05/2011  . Impotence due to erectile dysfunction 02/05/2011  . ATRIAL  FIBRILLATION 05/10/2010  . PALPITATIONS 04/23/2010  . CHEST PAIN-UNSPECIFIED 04/17/2009  . Hyperlipidemia 04/11/2009  . Essential hypertension 04/11/2009  . GERD 04/11/2009   Past Medical History:  Diagnosis Date  . Atrial fibrillation ([place])   . Chest pain, unspecified   . Esophageal reflux   . Family history of ischemic heart disease   . Other and unspecified hyperlipidemia   . Palpitations   . Ulcerative colitis   . Unspecified essential hypertension    Past Surgical History:  Procedure Laterality Date  . ELBOW SURGERY     left  . KNEE ARTHROSCOPY     rihgt  . VASECTOMY     Allergies  Allergen Reactions  . Codeine Itching    REACTION: Reaction not known   Prior to Admission medications   Medication Sig Start Date End Date Taking? Authorizing Provider  amLODipine-benazepril (LOTREL) 10-20 MG capsule Take 1 capsule by mouth daily. 09/06/18  Yes Wendie Agreste, MD  apixaban (ELIQUIS) 5 MG TABS tablet Take 1 tablet (5 mg total) by mouth 2 (two) times daily. 09/15/18  Yes Lelon Perla, MD  LORazepam (ATIVAN) 0.5 MG tablet Take 1 tablet (0.5 mg total) by mouth as needed. 03/17/18  Yes Wendie Agreste, MD  mesalamine (LIALDA) 1.2 g EC tablet Take 2 tablets (2.4 g total) by mouth daily. 12/08/16  Yes Wendie Agreste, MD  metoprolol succinate (TOPROL-XL) 50 MG 24 hr tablet TAKE 1 TABLET BY MOUTH EVERY DAY 09/14/18  Yes Lelon Perla, MD  omeprazole (PRILOSEC) 40 MG capsule TAKE 1 CAPSULE BY MOUTH EVERY DAY 09/06/18  Yes Wendie Agreste, MD  pravastatin (PRAVACHOL) 40 MG tablet Take 1 tablet (40 mg total) by mouth daily. 03/17/18  Yes Wendie Agreste, MD  triamcinolone cream (KENALOG) 0.1 % Apply topically 2 (two) times daily as needed. 01/07/18  Yes Wendie Agreste, MD   Social History   Socioeconomic History  . Marital status: Single    Spouse name: Not on file  . Number of children: Not on file  . Years of education: Not on file  . Highest education level:  Not on file  Occupational History  . Not on file  Social Needs  . Financial resource strain: Not on file  . Food insecurity    Worry: Not on file    Inability: Not on file  . Transportation needs    Medical: Not on file    Non-medical: Not on file  Tobacco Use  . Smoking status: Former Smoker    Years: 25.00    Types: Cigarettes, Cigars  . Smokeless tobacco: Never Used  Substance and Sexual Activity  . Alcohol use: Yes    Alcohol/week: 0.0 standard drinks  . Drug use: No  . Sexual activity: Yes  Lifestyle  . Physical activity    Days per week: Not on file    Minutes per session: Not on file  . Stress: Not on file  Relationships  . Social Herbalist on phone: Not on file    Gets together: Not on file    Attends religious service: Not on file    Active member of club or organization: Not on file    Attends meetings of clubs or organizations: Not on file    Relationship status: Not on file  . Intimate partner violence    Fear of current or ex partner: Not on file    Emotionally abused: Not on file    Physically abused: Not on file    Forced sexual activity: Not on file  Other Topics Concern  . Not on file  Social History Narrative   Married. Education: [employer]. Exercise: Weekly.     Review of Systems  Constitutional: Negative for fatigue and unexpected weight change.  Eyes: Negative for visual disturbance.  Respiratory: Negative for cough, chest tightness and shortness of breath.   Cardiovascular: Negative for chest pain, palpitations and leg swelling.  Gastrointestinal: Negative for abdominal pain and blood in stool.  Neurological: Negative for dizziness, light-headedness and headaches.       Objective:   Physical Exam Vitals signs reviewed.  Constitutional:      Appearance: He is well-developed.  HENT:     Head: Normocephalic and atraumatic.  Eyes:     Pupils: Pupils are equal, round, and reactive to light.  Neck:     Vascular: No carotid  bruit or JVD.  Cardiovascular:     Rate and Rhythm: Normal rate and regular rhythm.     Heart sounds: Normal heart sounds. No murmur.  Pulmonary:     Effort: Pulmonary effort is normal.     Breath sounds: Normal breath sounds. No rales.  Musculoskeletal:     Right lower leg: Edema (at ankles bilaterally - 1 +) present.     Left lower leg: Edema present.  Skin:    General: Skin is warm and dry.  Neurological:     Mental Status: He is alert and oriented  to person, place, and time.    Vitals:   09/27/18 1454 09/27/18 1518  BP: (!) 146/77 130/72  Pulse: 60   Temp: 98.5 F (36.9 C)   TempSrc: Oral   SpO2: 98%   Weight: 217 lb 3.2 oz (98.5 kg)   Height: 6' 1"  (1.854 m)        Assessment & Plan:    DEMONT LINFORD is a 73 y.o. male Essential hypertension - Plan: metoprolol succinate (TOPROL-XL) 50 MG 24 hr tablet, amLODipine-benazepril (LOTREL) 10-20 MG capsule Peripheral edema  -Stable control, suspect peripheral edema secondary to amlodipine.  Option of lower dose with higher dose of ACE inhibitor or low-dose amlodipine at 5 mg with same dose of ACE inhibitor and close monitoring of readings.  He would like to remain on same dosing for now, but will discuss with cardiology at upcoming visit.  Hyperlipidemia, unspecified hyperlipidemia type - Plan: CMP14+EGFR, Lipid panel, pravastatin (PRAVACHOL) 40 MG tablet  -  Stable, tolerating current regimen. Medications refilled. Labs pending as above.   Ulcerative pancolitis with rectal bleeding (HCC) - Plan: mesalamine (LIALDA) 1.2 g EC tablet  -  Stable, tolerating current regimen. Medications refilled. Follow up with GI if needed, but has plan if flares.   Meds ordered this encounter  Medications  . metoprolol succinate (TOPROL-XL) 50 MG 24 hr tablet    Sig: TAKE 1 TABLET BY MOUTH EVERY DAY    Dispense:  90 tablet    Refill:  2  . amLODipine-benazepril (LOTREL) 10-20 MG capsule    Sig: Take 1 capsule by mouth daily.     Dispense:  90 capsule    Refill:  2  . mesalamine (LIALDA) 1.2 g EC tablet    Sig: Take 2 tablets (2.4 g total) by mouth daily.    Dispense:  180 tablet    Refill:  2  . pravastatin (PRAVACHOL) 40 MG tablet    Sig: Take 1 tablet (40 mg total) by mouth daily.    Dispense:  90 tablet    Refill:  2   Patient Instructions    Ankle swelling may be due to amlodipine. You are at max dose of the amlodipine in your Lotrel combo pill .  One option is lower dose of amlodipine, higher dose of benazepril (5/40), or just try lower dose of amlodipine (5/20).   No med changes for now.   Follow up in 6 months for wellness exam. Thanks for coming in today and stay safe.    Peripheral Edema  Peripheral edema is swelling that is caused by a buildup of fluid. Peripheral edema most often affects the lower legs, ankles, and feet. It can also develop in the arms, hands, and face. The area of the body that has peripheral edema will look swollen. It may also feel heavy or warm. Your clothes may start to feel tight. Pressing on the area may make a temporary dent in your skin. You may not be able to move your swollen arm or leg as much as usual. There are many causes of peripheral edema. It can happen because of a complication of other conditions such as congestive heart failure, kidney disease, or a problem with your blood circulation. It also can be a side effect of certain medicines or because of an infection. It often happens to women during pregnancy. Sometimes, the cause is not known. Follow these instructions at home: Managing pain, stiffness, and swelling   Raise (elevate) your legs while you are sitting  or lying down.  Move around often to prevent stiffness and to lessen swelling.  Do not sit or stand for long periods of time.  Wear support stockings as told by your health care provider. Medicines  Take over-the-counter and prescription medicines only as told by your health care provider.  Your  health care provider may prescribe medicine to help your body get rid of excess water (diuretic). General instructions  Pay attention to any changes in your symptoms.  Follow instructions from your health care provider about limiting salt (sodium) in your diet. Sometimes, eating less salt may reduce swelling.  Moisturize skin daily to help prevent skin from cracking and draining.  Keep all follow-up visits as told by your health care provider. This is important. Contact a health care provider if you have:  A fever.  Edema that starts suddenly or is getting worse, especially if you are pregnant or have a medical condition.  Swelling in only one leg.  Increased swelling, redness, or pain in one or both of your legs.  Drainage or sores at the area where you have edema. Get help right away if you:  Develop shortness of breath, especially when you are lying down.  Have pain in your chest or abdomen.  Feel weak.  Feel faint. Summary  Peripheral edema is swelling that is caused by a buildup of fluid. Peripheral edema most often affects the lower legs, ankles, and feet.  Move around often to prevent stiffness and to lessen swelling. Do not sit or stand for long periods of time.  Pay attention to any changes in your symptoms.  Contact a health care provider if you have edema that starts suddenly or is getting worse, especially if you are pregnant or have a medical condition.  Get help right away if you develop shortness of breath, especially when lying down. This information is not intended to replace advice given to you by your health care provider. Make sure you discuss any questions you have with your health care provider. Document Released: 03/20/2004 Document Revised: 11/04/2017 Document Reviewed: 11/04/2017 Elsevier Patient Education  [employer].    If you have lab work done today you will be contacted with your lab results within the next 2 weeks.  If you have not  heard from [place] then please contact us. The fastest way to get your results is to register for My Chart.   IF you received an x-ray today, you will receive an invoice from [facility]. Please contact [facility] at [phone] with questions or concerns regarding your invoice.   IF you received labwork today, you will receive an invoice from [facility]. Please contact [facility] at [phone] with questions or concerns regarding your invoice.   Our billing staff will not be able to assist you with questions regarding bills from these companies.  You will be contacted with the lab results as soon as they are available. The fastest way to get your results is to activate your My Chart account. Instructions are located on the last page of this paperwork. If you have not heard from [place] regarding the results in 2 weeks, please contact this office.       Signed,   Merri Ray, MD [facility] [place].  09/27/18 11:46 PM

## 2018-09-28 LAB — CMP14+EGFR
ALT: 21 IU/L (ref 0–44)
AST: 20 IU/L (ref 0–40)
Albumin/Globulin Ratio: 1.9 (ref 1.2–2.2)
Albumin: 4.5 g/dL (ref 3.7–4.7)
Alkaline Phosphatase: 43 IU/L (ref 39–117)
BUN/Creatinine Ratio: 20 (ref 10–24)
BUN: 19 mg/dL (ref 8–27)
Bilirubin Total: 0.3 mg/dL (ref 0.0–1.2)
CO2: 21 mmol/L (ref 20–29)
Calcium: 8.7 mg/dL (ref 8.6–10.2)
Chloride: 102 mmol/L (ref 96–106)
Creatinine, Ser: 0.95 mg/dL (ref 0.76–1.27)
GFR calc Af Amer: 93 mL/min/{1.73_m2} (ref >59)
GFR calc non Af Amer: 80 mL/min/{1.73_m2} (ref >59)
Globulin, Total: 2.4 g/dL (ref 1.5–4.5)
Glucose: 114 mg/dL — ABNORMAL HIGH (ref 65–99)
Potassium: 4.1 mmol/L (ref 3.5–5.2)
Sodium: 140 mmol/L (ref 134–144)
Total Protein: 6.9 g/dL (ref 6.0–8.5)

## 2018-09-28 LAB — LIPID PANEL
Chol/HDL Ratio: 2.5 ratio (ref 0.0–5.0)
Cholesterol, Total: 168 mg/dL (ref 100–199)
HDL: 68 mg/dL (ref >39)
LDL Calculated: 84 mg/dL (ref 0–99)
Triglycerides: 79 mg/dL (ref 0–149)
VLDL Cholesterol Cal: 16 mg/dL (ref 5–40)

## 2018-10-07 ENCOUNTER — Encounter: Payer: Self-pay | Admitting: Radiology

## 2018-10-08 ENCOUNTER — Ambulatory Visit: Payer: Medicare Other | Admitting: Cardiology

## 2018-10-31 NOTE — Progress Notes (Signed)
HPI: FU atrial fibrillation. Nuclear study in February of 2011 showed an ejection fraction of 62% and normal perfusion. Echocardiogram in March of 2012 showed normal LV function, mild left atrial enlargement and mild mitral regurgitation. Patient had a monitor in February of 2012 that showed documentation of atrial fibrillation. Treated with beta blocker and apixaban. Abdominal ultrasound October 2014 showed no aneurysm. Since he was last seen,there is no dyspnea, chest pain, palpitations, syncope or bleeding.  Mild pedal edema.  Current Outpatient Medications  Medication Sig Dispense Refill  . amLODipine-benazepril (LOTREL) 10-20 MG capsule Take 1 capsule by mouth daily. 90 capsule 2  . apixaban (ELIQUIS) 5 MG TABS tablet Take 1 tablet (5 mg total) by mouth 2 (two) times daily. 180 tablet 0  . LORazepam (ATIVAN) 0.5 MG tablet Take 1 tablet (0.5 mg total) by mouth as needed. 30 tablet 1  . mesalamine (LIALDA) 1.2 g EC tablet Take 2 tablets (2.4 g total) by mouth daily. 180 tablet 2  . metoprolol succinate (TOPROL-XL) 50 MG 24 hr tablet TAKE 1 TABLET BY MOUTH EVERY DAY 90 tablet 2  . omeprazole (PRILOSEC) 40 MG capsule TAKE 1 CAPSULE BY MOUTH EVERY DAY 30 capsule 2  . pravastatin (PRAVACHOL) 40 MG tablet Take 1 tablet (40 mg total) by mouth daily. 90 tablet 2  . triamcinolone cream (KENALOG) 0.1 % Apply topically 2 (two) times daily as needed. 480 g 1   No current facility-administered medications for this visit.      Past Medical History:  Diagnosis Date  . Atrial fibrillation ([place])   . Chest pain, unspecified   . Esophageal reflux   . Family history of ischemic heart disease   . Other and unspecified hyperlipidemia   . Palpitations   . Ulcerative colitis   . Unspecified essential hypertension     Past Surgical History:  Procedure Laterality Date  . ELBOW SURGERY     left  . KNEE ARTHROSCOPY     rihgt  . VASECTOMY      Social History   Socioeconomic History  .  Marital status: Single    Spouse name: Not on file  . Number of children: Not on file  . Years of education: Not on file  . Highest education level: Not on file  Occupational History  . Not on file  Social Needs  . Financial resource strain: Not on file  . Food insecurity    Worry: Not on file    Inability: Not on file  . Transportation needs    Medical: Not on file    Non-medical: Not on file  Tobacco Use  . Smoking status: Former Smoker    Years: 25.00    Types: Cigarettes, Cigars  . Smokeless tobacco: Never Used  Substance and Sexual Activity  . Alcohol use: Yes    Alcohol/week: 0.0 standard drinks  . Drug use: No  . Sexual activity: Yes  Lifestyle  . Physical activity    Days per week: Not on file    Minutes per session: Not on file  . Stress: Not on file  Relationships  . Social Herbalist on phone: Not on file    Gets together: Not on file    Attends religious service: Not on file    Active member of club or organization: Not on file    Attends meetings of clubs or organizations: Not on file    Relationship status: Not on file  . Intimate  partner violence    Fear of current or ex partner: Not on file    Emotionally abused: Not on file    Physically abused: Not on file    Forced sexual activity: Not on file  Other Topics Concern  . Not on file  Social History Narrative   Married. Education: [employer]. Exercise: Weekly.    Family History  Problem Relation Age of Onset  . Stroke Brother   . Coronary artery disease Other        family history    ROS: no fevers or chills, productive cough, hemoptysis, dysphasia, odynophagia, melena, hematochezia, dysuria, hematuria, rash, seizure activity, orthopnea, PND, claudication. Remaining systems are negative.  Physical Exam: Well-developed well-nourished in no acute distress.  Skin is warm and dry.  HEENT is normal.  Neck is supple.  Chest is clear to auscultation with normal expansion.  Cardiovascular  exam is regular rate and rhythm.  Abdominal exam nontender or distended. No masses palpated. Extremities show trace edema. neuro grossly intact  ECG-sinus rhythm at a rate of 58, no ST changes.  Personally reviewed  A/P  1 paroxysmal atrial fibrillation-patient is in sinus rhythm today.  We will continue with present dose of beta-blocker.  Continue apixaban.  Check hemoglobin; recent creatinine 0.95.  2 hypertension-blood pressure is controlled today.  Continue present medications and follow.  Note he is describing occasional pedal edema.  We discussed changing his antihypertensive to avoid amlodipine.  However he would like to continue for now.  We can consider in the future if pedal edema worsens.  3 hyperlipidemia-continue statin.    Kirk Ruths, MD

## 2018-11-02 ENCOUNTER — Encounter: Payer: Self-pay | Admitting: Cardiology

## 2018-11-02 ENCOUNTER — Ambulatory Visit (INDEPENDENT_AMBULATORY_CARE_PROVIDER_SITE_OTHER): Payer: BC Managed Care – PPO | Admitting: Cardiology

## 2018-11-02 ENCOUNTER — Other Ambulatory Visit: Payer: Self-pay

## 2018-11-02 VITALS — BP 134/80 | HR 74 | Temp 97.1°F | Ht 73.0 in | Wt 217.2 lb

## 2018-11-02 DIAGNOSIS — I10 Essential (primary) hypertension: Secondary | ICD-10-CM | POA: Diagnosis not present

## 2018-11-02 DIAGNOSIS — I480 Paroxysmal atrial fibrillation: Secondary | ICD-10-CM

## 2018-11-02 DIAGNOSIS — E785 Hyperlipidemia, unspecified: Secondary | ICD-10-CM | POA: Diagnosis not present

## 2018-11-02 NOTE — Patient Instructions (Signed)
Medication Instructions:  The current medical regimen is effective;  continue present plan and medications.  If you need a refill on your cardiac medications before your next appointment, please call your pharmacy.   Lab work: CBC If you have labs (blood work) drawn today and your tests are completely normal, you will receive your results only by: Marland Kitchen MyChart Message (if you have MyChart) OR . A paper copy in the mail If you have any lab test that is abnormal or we need to change your treatment, we will call you to review the results.   Follow-Up: At [facility], you and your health needs are our priority.  As part of our continuing mission to provide you with exceptional heart care, we have created designated Provider Care Teams.  These Care Teams include your primary Cardiologist (physician) and Advanced Practice Providers (APPs -  Physician Assistants and Nurse Practitioners) who all work together to provide you with the care you need, when you need it. You will need a follow up appointment in 12 months.  Please call our office 2 months in advance to schedule this appointment.  You may see Dr.Crenshaw or one of the following Advanced Practice Providers on your designated Care Team:   Kerin Ransom, PA-C Roby Lofts, [place] . Sande Rives, PA-C

## 2018-11-03 LAB — CBC
Hematocrit: 36.9 % — ABNORMAL LOW (ref 37.5–51.0)
Hemoglobin: 12.9 g/dL — ABNORMAL LOW (ref 13.0–17.7)
MCH: 31.6 pg (ref 26.6–33.0)
MCHC: 35 g/dL (ref 31.5–35.7)
MCV: 90 fL (ref 79–97)
Platelets: 220 10*3/uL (ref 150–450)
RBC: 4.08 x10E6/uL — ABNORMAL LOW (ref 4.14–5.80)
RDW: 12.8 % (ref 11.6–15.4)
WBC: 5.9 10*3/uL (ref 3.4–10.8)

## 2018-12-04 ENCOUNTER — Other Ambulatory Visit: Payer: Self-pay | Admitting: Family Medicine

## 2018-12-04 DIAGNOSIS — K219 Gastro-esophageal reflux disease without esophagitis: Secondary | ICD-10-CM

## 2018-12-27 ENCOUNTER — Other Ambulatory Visit: Payer: Self-pay | Admitting: *Deleted

## 2018-12-27 DIAGNOSIS — I480 Paroxysmal atrial fibrillation: Secondary | ICD-10-CM

## 2018-12-27 MED ORDER — APIXABAN 5 MG PO TABS: 5.0000 mg | ORAL_TABLET | Freq: Two times a day (BID) | ORAL | 2 refills | Status: DC

## 2019-01-12 DIAGNOSIS — Z03818 Encounter for observation for suspected exposure to other biological agents ruled out: Secondary | ICD-10-CM | POA: Diagnosis not present

## 2019-03-07 ENCOUNTER — Other Ambulatory Visit: Payer: Self-pay | Admitting: Family Medicine

## 2019-03-07 DIAGNOSIS — K219 Gastro-esophageal reflux disease without esophagitis: Secondary | ICD-10-CM

## 2019-03-30 ENCOUNTER — Ambulatory Visit: Payer: Medicare Other | Admitting: Family Medicine

## 2019-04-04 ENCOUNTER — Other Ambulatory Visit: Payer: Self-pay

## 2019-04-04 ENCOUNTER — Ambulatory Visit (INDEPENDENT_AMBULATORY_CARE_PROVIDER_SITE_OTHER): Payer: BC Managed Care – PPO | Admitting: Family Medicine

## 2019-04-04 VITALS — BP 125/76 | HR 58 | Temp 98.1°F | Ht 73.0 in | Wt 208.0 lb

## 2019-04-04 DIAGNOSIS — R739 Hyperglycemia, unspecified: Secondary | ICD-10-CM

## 2019-04-04 DIAGNOSIS — I10 Essential (primary) hypertension: Secondary | ICD-10-CM

## 2019-04-04 DIAGNOSIS — E785 Hyperlipidemia, unspecified: Secondary | ICD-10-CM | POA: Diagnosis not present

## 2019-04-04 DIAGNOSIS — L209 Atopic dermatitis, unspecified: Secondary | ICD-10-CM

## 2019-04-04 MED ORDER — AMLODIPINE BESY-BENAZEPRIL HCL 10-20 MG PO CAPS: 1.0000 | ORAL_CAPSULE | Freq: Every day | ORAL | 2 refills | Status: DC

## 2019-04-04 MED ORDER — METOPROLOL SUCCINATE ER 50 MG PO TB24: ORAL_TABLET | ORAL | 2 refills | Status: DC

## 2019-04-04 MED ORDER — TRIAMCINOLONE ACETONIDE 0.1 % EX CREA: TOPICAL_CREAM | Freq: Two times a day (BID) | CUTANEOUS | 1 refills | Status: DC | PRN

## 2019-04-04 MED ORDER — PRAVASTATIN SODIUM 40 MG PO TABS: 40.0000 mg | ORAL_TABLET | Freq: Every day | ORAL | 2 refills | Status: DC

## 2019-04-04 NOTE — Progress Notes (Signed)
Subjective:  Patient ID: Joseph Smith, male    DOB: [birth year]  Age: 74 y.o. MRN: [id number]  CC:  Chief Complaint  Patient presents with  . Follow-up    on medical conditions. such as hyper tension. pt state no change to his chronic conditions. pt also states current medication is working great with no side effects.    HPI Joseph Smith presents for   Hypertension: lotrel 10/68m qd. No further ankle swelling. toprol 543mqd.  Home readings:none, but stable at other visits.  On eliquis for Afib. No new bleeding. Dr. CrStanford Breed cardiology.   BP Readings from Last 3 Encounters:  04/04/19 125/76  11/02/18 134/80  09/27/18 130/72   Lab Results  Component Value Date   CREATININE 1.03 04/04/2019    Hyperlipidemia: Pravastatin 4025md, no myalgias/side effects.  Lab Results  Component Value Date   CHOL 175 04/04/2019   HDL 78 04/04/2019   LDLCALC 85 04/04/2019   TRIG 60 04/04/2019   CHOLHDL 2.2 04/04/2019   Lab Results  Component Value Date   ALT 18 04/04/2019   AST 20 04/04/2019   ALKPHOS 50 04/04/2019   BILITOT 0.3 04/04/2019    L ankle rash:  Off and on over the years. Seen by prior PCP. Treated with triamcinolone. Helps itching, but rash comes back. Some areas on sides of elbow at times. Has used kenalog cream on occasion - helps itch, rash remains No face, mouth, genital lesions Worse in winter.   Hyperglycemia: Glucose 114 August 3, normal A1c last January.  Weight has decreased since last year. Wt Readings from Last 3 Encounters:  04/04/19 208 lb (94.3 kg)  11/02/18 217 lb 3.2 oz (98.5 kg)  09/27/18 217 lb 3.2 oz (98.5 kg)      History Patient Active Problem List   Diagnosis Date Noted  . Ulcerative colitis (HCC[facility]utchinson4/06/2016  . Bruit 12/03/2012  . Hyperglycemia 02/05/2011  . Impotence due to erectile dysfunction 02/05/2011  . ATRIAL FIBRILLATION 05/10/2010  . PALPITATIONS 04/23/2010  . CHEST PAIN-UNSPECIFIED 04/17/2009  . Hyperlipidemia  04/11/2009  . Essential hypertension 04/11/2009  . GERD 04/11/2009   Past Medical History:  Diagnosis Date  . Atrial fibrillation ([facility]CC[facility]y . Chest pain, unspecified   . Esophageal reflux   . Family history of ischemic heart disease   . Other and unspecified hyperlipidemia   . Palpitations   . Ulcerative colitis   . Unspecified essential hypertension    Past Surgical History:  Procedure Laterality Date  . ELBOW SURGERY     left  . KNEE ARTHROSCOPY     rihgt  . VASECTOMY     Allergies  Allergen Reactions  . Codeine Itching    REACTION: Reaction not known   Prior to Admission medications   Medication Sig Start Date End Date Taking? Authorizing Provider  amLODipine-benazepril (LOTREL) 10-20 MG capsule Take 1 capsule by mouth daily. 09/27/18  Yes GreWendie AgresteD  apixaban (ELIQUIS) 5 MG TABS tablet Take 1 tablet (5 mg total) by mouth 2 (two) times daily. 12/27/18  Yes CreLelon PerlaD  LORazepam (ATIVAN) 0.5 MG tablet Take 1 tablet (0.5 mg total) by mouth as needed. 03/17/18  Yes GreWendie AgresteD  mesalamine (LIALDA) 1.2 g EC tablet Take 2 tablets (2.4 g total) by mouth daily. 09/27/18  Yes GreWendie AgresteD  metoprolol succinate (TOPROL-XL) 50 MG 24 hr tablet TAKE 1 TABLET BY MOUTH EVERY DAY 09/27/18  Yes  Wendie Agreste, MD  omeprazole (PRILOSEC) 40 MG capsule TAKE 1 CAPSULE BY MOUTH EVERY DAY 03/07/19  Yes Wendie Agreste, MD  pravastatin (PRAVACHOL) 40 MG tablet Take 1 tablet (40 mg total) by mouth daily. 09/27/18  Yes Wendie Agreste, MD  triamcinolone cream (KENALOG) 0.1 % Apply topically 2 (two) times daily as needed. 01/07/18  Yes Wendie Agreste, MD   Social History   Socioeconomic History  . Marital status: Single    Spouse name: Not on file  . Number of children: Not on file  . Years of education: Not on file  . Highest education level: Not on file  Occupational History  . Not on file  Tobacco Use  . Smoking status: Former Smoker    Years:  25.00    Types: Cigarettes, Cigars  . Smokeless tobacco: Never Used  Substance and Sexual Activity  . Alcohol use: Yes    Alcohol/week: 0.0 standard drinks  . Drug use: No  . Sexual activity: Yes  Other Topics Concern  . Not on file  Social History Narrative   Married. Education: [employer]. Exercise: Weekly.   Social Determinants of Health   Financial Resource Strain:   . Difficulty of Paying Living Expenses: Not on file  Food Insecurity:   . Worried About Charity fundraiser in the Last Year: Not on file  . Ran Out of Food in the Last Year: Not on file  Transportation Needs:   . Lack of Transportation (Medical): Not on file  . Lack of Transportation (Non-Medical): Not on file  Physical Activity:   . Days of Exercise per Week: Not on file  . Minutes of Exercise per Session: Not on file  Stress:   . Feeling of Stress : Not on file  Social Connections:   . Frequency of Communication with Friends and Family: Not on file  . Frequency of Social Gatherings with Friends and Family: Not on file  . Attends Religious Services: Not on file  . Active Member of Clubs or Organizations: Not on file  . Attends Archivist Meetings: Not on file  . Marital Status: Not on file  Intimate Partner Violence:   . Fear of Current or Ex-Partner: Not on file  . Emotionally Abused: Not on file  . Physically Abused: Not on file  . Sexually Abused: Not on file    Review of Systems  Constitutional: Negative for fatigue and unexpected weight change.  Eyes: Negative for visual disturbance.  Respiratory: Negative for cough, chest tightness and shortness of breath.   Cardiovascular: Negative for chest pain, palpitations and leg swelling.  Gastrointestinal: Negative for abdominal pain and blood in stool.  Neurological: Negative for dizziness, light-headedness and headaches.     Objective:   Vitals:   04/04/19 1444  BP: 125/76  Pulse: (!) 58  Temp: 98.1 F (36.7 C)  TempSrc: Temporal    SpO2: 95%  Weight: 208 lb (94.3 kg)  Height: 6' 1"  (1.854 m)     Physical Exam Vitals reviewed.  Constitutional:      Appearance: He is well-developed.  HENT:     Head: Normocephalic and atraumatic.  Eyes:     Pupils: Pupils are equal, round, and reactive to light.  Neck:     Vascular: No carotid bruit or JVD.  Cardiovascular:     Rate and Rhythm: Normal rate and regular rhythm.     Heart sounds: Normal heart sounds. No murmur.  Pulmonary:  Effort: Pulmonary effort is normal.     Breath sounds: Normal breath sounds. No rales.  Skin:    General: Skin is warm and dry.     Comments: Approximately 3 to 4 cm dry patch on the left anterior ankle, approximately 2 cm dry patch on left and right lateral elbows.  No surrounding erythema, no induration.  Neurological:     Mental Status: He is alert and oriented to person, place, and time.     Assessment & Plan:  Joseph Smith is a 74 y.o. male . Hyperlipidemia, unspecified hyperlipidemia type - Plan: Lipid panel, Comprehensive metabolic panel, pravastatin (PRAVACHOL) 40 MG tablet  -Tolerating pravastatin, continue same dose, labs pending  Hyperglycemia - Plan: Hemoglobin A1c  -Check A1c, weight has decreased from previous readings.  Atopic dermatitis, unspecified type - Plan: triamcinolone cream (KENALOG) 0.1 %  -Topical Eucerin twice daily discussed, triamcinolone as needed twice per day.  RTC precautions.  Essential hypertension - Plan: Comprehensive metabolic panel, metoprolol succinate (TOPROL-XL) 50 MG 24 hr tablet, amLODipine-benazepril (LOTREL) 10-20 MG capsule  -Stable, continue same regimen.  Labs pending  Meds ordered this encounter  Medications  . metoprolol succinate (TOPROL-XL) 50 MG 24 hr tablet    Sig: TAKE 1 TABLET BY MOUTH EVERY DAY    Dispense:  90 tablet    Refill:  2  . amLODipine-benazepril (LOTREL) 10-20 MG capsule    Sig: Take 1 capsule by mouth daily.    Dispense:  90 capsule    Refill:  2  .  triamcinolone cream (KENALOG) 0.1 %    Sig: Apply topically 2 (two) times daily as needed.    Dispense:  80 g    Refill:  1  . pravastatin (PRAVACHOL) 40 MG tablet    Sig: Take 1 tablet (40 mg total) by mouth daily.    Dispense:  90 tablet    Refill:  2   Patient Instructions    Eucerin lotion to dry areas 2 times per day, and triamcinolone up to twice as needed.   If you have lab work done today you will be contacted with your lab results within the next 2 weeks.  If you have not heard from [place] then please contact us. The fastest way to get your results is to register for My Chart.   IF you received an x-ray today, you will receive an invoice from [facility]. Please contact [facility] at [phone] with questions or concerns regarding your invoice.   IF you received labwork today, you will receive an invoice from [place]. Please contact [facility] at [phone] with questions or concerns regarding your invoice.   Our billing staff will not be able to assist you with questions regarding bills from these companies.  You will be contacted with the lab results as soon as they are available. The fastest way to get your results is to activate your My Chart account. Instructions are located on the last page of this paperwork. If you have not heard from [place] regarding the results in 2 weeks, please contact this office.         Signed, Merri Ray, MD [facility]

## 2019-04-04 NOTE — Patient Instructions (Addendum)
  Eucerin lotion to dry areas 2 times per day, and triamcinolone up to twice as needed.   If you have lab work done today you will be contacted with your lab results within the next 2 weeks.  If you have not heard from [place] then please contact us. The fastest way to get your results is to register for My Chart.   IF you received an x-ray today, you will receive an invoice from [facility]. Please contact [facility] at [phone] with questions or concerns regarding your invoice.   IF you received labwork today, you will receive an invoice from [place]. Please contact [facility] at [phone] with questions or concerns regarding your invoice.   Our billing staff will not be able to assist you with questions regarding bills from these companies.  You will be contacted with the lab results as soon as they are available. The fastest way to get your results is to activate your My Chart account. Instructions are located on the last page of this paperwork. If you have not heard from [place] regarding the results in 2 weeks, please contact this office.

## 2019-04-05 ENCOUNTER — Encounter: Payer: Self-pay | Admitting: Family Medicine

## 2019-04-05 LAB — LIPID PANEL
Chol/HDL Ratio: 2.2 ratio (ref 0.0–5.0)
Cholesterol, Total: 175 mg/dL (ref 100–199)
HDL: 78 mg/dL (ref >39)
LDL Chol Calc (NIH): 85 mg/dL (ref 0–99)
Triglycerides: 60 mg/dL (ref 0–149)
VLDL Cholesterol Cal: 12 mg/dL (ref 5–40)

## 2019-04-05 LAB — COMPREHENSIVE METABOLIC PANEL
ALT: 18 IU/L (ref 0–44)
AST: 20 IU/L (ref 0–40)
Albumin/Globulin Ratio: 1.9 (ref 1.2–2.2)
Albumin: 4.6 g/dL (ref 3.7–4.7)
Alkaline Phosphatase: 50 IU/L (ref 39–117)
BUN/Creatinine Ratio: 16 (ref 10–24)
BUN: 16 mg/dL (ref 8–27)
Bilirubin Total: 0.3 mg/dL (ref 0.0–1.2)
CO2: 20 mmol/L (ref 20–29)
Calcium: 9.4 mg/dL (ref 8.6–10.2)
Chloride: 103 mmol/L (ref 96–106)
Creatinine, Ser: 1.03 mg/dL (ref 0.76–1.27)
GFR calc Af Amer: 84 mL/min/{1.73_m2} (ref >59)
GFR calc non Af Amer: 72 mL/min/{1.73_m2} (ref >59)
Globulin, Total: 2.4 g/dL (ref 1.5–4.5)
Glucose: 98 mg/dL (ref 65–99)
Potassium: 4.5 mmol/L (ref 3.5–5.2)
Sodium: 143 mmol/L (ref 134–144)
Total Protein: 7 g/dL (ref 6.0–8.5)

## 2019-04-05 LAB — HEMOGLOBIN A1C
Est. average glucose Bld gHb Est-mCnc: 111 mg/dL
Hgb A1c MFr Bld: 5.5 % (ref 4.8–5.6)

## 2019-04-12 ENCOUNTER — Encounter: Payer: Self-pay | Admitting: Radiology

## 2019-04-27 ENCOUNTER — Other Ambulatory Visit: Payer: Self-pay

## 2019-04-27 DIAGNOSIS — K51011 Ulcerative (chronic) pancolitis with rectal bleeding: Secondary | ICD-10-CM

## 2019-04-27 MED ORDER — MESALAMINE 1.2 G PO TBEC: 2.4000 g | DELAYED_RELEASE_TABLET | Freq: Every day | ORAL | 1 refills | Status: DC

## 2019-05-24 ENCOUNTER — Other Ambulatory Visit: Payer: Self-pay | Admitting: Family Medicine

## 2019-05-24 DIAGNOSIS — I10 Essential (primary) hypertension: Secondary | ICD-10-CM

## 2019-05-24 NOTE — Telephone Encounter (Signed)
Requested Prescriptions  Pending Prescriptions Disp Refills  . amLODipine-benazepril (LOTREL) 10-20 MG capsule [Pharmacy Med Name: AMLODIPINE-BENAZ 10/20MG CAPSULES] 90 capsule 2    Sig: TAKE 1 CAPSULE BY MOUTH DAILY     Cardiovascular: CCB + ACEI Combos Passed - 05/24/2019  2:07 PM      Passed - Cr in normal range and within 180 days    Creat  Date Value Ref Range Status  12/28/2015 1.11 0.70 - 1.25 mg/dL Final    Comment:      For patients > or = 73 years of age: The upper reference limit for Creatinine is approximately 13% higher for people identified as African-American.      Creatinine, Ser  Date Value Ref Range Status  04/04/2019 1.03 0.76 - 1.27 mg/dL Final         Passed - K in normal range and within 180 days    Potassium  Date Value Ref Range Status  04/04/2019 4.5 3.5 - 5.2 mmol/L Final         Passed - Patient is not pregnant      Passed - Last BP in normal range    BP Readings from Last 1 Encounters:  04/04/19 125/76         Passed - Valid encounter within last 6 months    Recent Outpatient Visits          1 month ago Hyperlipidemia, unspecified hyperlipidemia type   [facility] [place], MD   7 months ago Essential hypertension   [facility], Ranell Patrick, MD   1 year ago Medicare annual wellness visit, subsequent   [facility], Lilia Argue, MD   1 year ago Screening for thyroid disorder   [facility] [place], MD   1 year ago Hyperlipidemia, unspecified hyperlipidemia type   [facility], Ranell Patrick, MD      Future Appointments            In 4 months Carlota Raspberry Ranell Patrick, MD [facility] [place], [facility]

## 2019-05-30 ENCOUNTER — Telehealth: Payer: Self-pay | Admitting: Cardiology

## 2019-05-30 NOTE — Progress Notes (Signed)
Cardiology Office Note   Date:  05/30/2019   ID:  Joseph Smith, DOB Feb 19, 1946, MRN [id number]  PCP:  Wendie Agreste, MD  Cardiologist:  [place] [place] CC: Atrial fib   History of Present Illness: Joseph Smith is a 74 y.o. male who presents for palpitations, with history of atrial fib, documented in 2012. Treated with BB and Apixaban, HTN, .GERD.  He called our office 05/29/2019 for complaints of palpitations, no lightheadedness and dyspnea. He reported that he felt faint sometimes but never had syncope. He was added on to my schedule today to be seen for these complaints.   He states that he is having "waves" of dizziness associated with his elevated HR and palpitations. He normally does not feel atrial fib, and usually checks his pulse and finds it to be regular. However, the last two weeks he is noticing this much more, especially during the last few days.   He denies any bleeding, DOE, chest pain, or fatigue. He is medically complaint. He just wants this to stop happening.   Past Medical History:  Diagnosis Date  . Atrial fibrillation ([place])   . Chest pain, unspecified   . Esophageal reflux   . Family history of ischemic heart disease   . Other and unspecified hyperlipidemia   . Palpitations   . Ulcerative colitis   . Unspecified essential hypertension     Past Surgical History:  Procedure Laterality Date  . ELBOW SURGERY     left  . KNEE ARTHROSCOPY     rihgt  . VASECTOMY       Current Outpatient Medications  Medication Sig Dispense Refill  . amLODipine-benazepril (LOTREL) 10-20 MG capsule TAKE 1 CAPSULE BY MOUTH DAILY 90 capsule 2  . apixaban (ELIQUIS) 5 MG TABS tablet Take 1 tablet (5 mg total) by mouth 2 (two) times daily. 180 tablet 2  . LORazepam (ATIVAN) 0.5 MG tablet Take 1 tablet (0.5 mg total) by mouth as needed. 30 tablet 1  . mesalamine (LIALDA) 1.2 g EC tablet Take 2 tablets (2.4 g total) by mouth daily. NEED APPOINTMENT FOR FUTURE REFILL 90 tablet 1  .  metoprolol succinate (TOPROL-XL) 50 MG 24 hr tablet TAKE 1 TABLET BY MOUTH EVERY DAY 90 tablet 2  . omeprazole (PRILOSEC) 40 MG capsule TAKE 1 CAPSULE BY MOUTH EVERY DAY 30 capsule 2  . pravastatin (PRAVACHOL) 40 MG tablet Take 1 tablet (40 mg total) by mouth daily. 90 tablet 2  . triamcinolone cream (KENALOG) 0.1 % Apply topically 2 (two) times daily as needed. 80 g 1   No current facility-administered medications for this visit.    Allergies:   Codeine    Social History:  The patient  reports that he has quit smoking. His smoking use included cigarettes and cigars. He quit after 25.00 years of use. He has never used smokeless tobacco. He reports current alcohol use. He reports that he does not use drugs.   Family History:  The patient's family history includes Coronary artery disease in an other family member; Stroke in his brother.    ROS: All other systems are reviewed and negative. Unless otherwise mentioned in H&P    PHYSICAL EXAM: VS:  There were no vitals taken for this visit. , BMI There is no height or weight on file to calculate BMI. GEN: Well nourished, well developed, in no acute distress HEENT: normal Neck: no JVD, carotid bruits, or masses Cardiac: RRR, tachycardic; no murmurs, rubs, or gallops,no edema  Respiratory:  Clear to auscultation bilaterally, normal work of breathing GI: soft, nontender, nondistended, + BS MS: no deformity or atrophy Skin: warm and dry, no rash Neuro:  Strength and sensation are intact Psych: euthymic mood, full affect   EKG:  Atrial fib, rate of 108 bpm  (Personally reviewed).   Recent Labs: 11/02/2018: Hemoglobin 12.9; Platelets 220 04/04/2019: ALT 18; BUN 16; Creatinine, Ser 1.03; Potassium 4.5; Sodium 143    Lipid Panel    Component Value Date/Time   CHOL 175 04/04/2019 1729   TRIG 60 04/04/2019 1729   HDL 78 04/04/2019 1729   CHOLHDL 2.2 04/04/2019 1729   CHOLHDL 2.9 12/28/2015 0931   VLDL 21 12/28/2015 0931   LDLCALC 85  04/04/2019 1729      Wt Readings from Last 3 Encounters:  04/04/19 208 lb (94.3 kg)  11/02/18 217 lb 3.2 oz (98.5 kg)  09/27/18 217 lb 3.2 oz (98.5 kg)     ASSESSMENT AND PLAN:  1. Atrial fib with RVR: Hx of PAF on metoprolol XL 50 mg daily and Eliquis 5 mg BID.  He is having accelerated rates which are symptomatic.  I will increase his metoprolol XL to 75 mg daily. I will place a 1 week cardiac monitor (real time) to evaluated forab symptoms of more accelerated rates or other arrhythmias which may be causing symptoms. Will check labs to include CBC, BMET, Mg, and TSH.  Echo for LV function and atrial size.  He will follow up with Dr. Stanford Breed in one month.   2. Hypertension: Continue amlodipine/benezepril and BB with higher dose as above. Echo for changes in LV fx.   3. Hyperlipidemia: Continue pravastatin.  Will check fasting labs in 6 months if not completed by  PCP.  4. Ulcerative colitis: He is followed by GI. He continues to see some blood on occasion in his stool. Checking CBC.   5. GERD: Continue PPI. Checking Mg.    Current medicines are reviewed at length with the patient today.  I have spent 25 minutes dedicated to the care of this patient on the date of this encounter to include pre-visit review of records, assessment, management and diagnostic testing,with shared decision making.  Labs/ tests ordered today include: Echocardiogram, 1 week cardiac monitor, BMET, CBC, TSH, Mg.   Phill Myron. West Pugh, ANP, AACC   05/30/2019 2:27 PM    [place] [address] Office [phone] Fax [phone]  Notice: This dictation was prepared with Dragon dictation along with smaller phrase technology. Any transcriptional errors that result from this process are unintentional and may not be corrected upon review.

## 2019-05-30 NOTE — Telephone Encounter (Signed)
Agree with plan Joseph Smith  

## 2019-05-30 NOTE — Telephone Encounter (Signed)
New Message   Patient c/o Palpitations:  High priority if patient c/o lightheadedness, shortness of breath, or chest pain  1) How long have you had palpitations/irregular HR/ Afib? Are you having the symptoms now? Since Friday.. Been coming and going   2) Are you currently experiencing lightheadedness, SOB or CP? no  3) Do you have a history of afib (atrial fibrillation) or irregular heart rhythm? yes  4) Have you checked your BP or HR? (document readings if available): 87  5) Are you experiencing any other symptoms? Mainly fatigue

## 2019-05-30 NOTE — Telephone Encounter (Signed)
Left message to call office

## 2019-05-30 NOTE — Telephone Encounter (Signed)
I spoke with patient. He has history of afib but has never been aware of it before. He started feeling palpitations this past Friday Will feel faint at times and have to sit down. He then feels better. He states palpitations come and go. Heart rate around 65. Taking Eliquis and Toprol. I scheduled patient to see K. Purcell Nails, NP on April 7,2021 at 11:15.  I told him if he passes out he should be evaluated in ED

## 2019-06-01 ENCOUNTER — Other Ambulatory Visit: Payer: Self-pay

## 2019-06-01 ENCOUNTER — Ambulatory Visit (INDEPENDENT_AMBULATORY_CARE_PROVIDER_SITE_OTHER): Payer: BC Managed Care – PPO | Admitting: Adult Health

## 2019-06-01 ENCOUNTER — Telehealth: Payer: Self-pay | Admitting: Radiology

## 2019-06-01 ENCOUNTER — Encounter: Payer: Self-pay | Admitting: Adult Health

## 2019-06-01 VITALS — BP 130/70 | HR 108 | Temp 97.2°F | Ht 73.0 in | Wt 205.2 lb

## 2019-06-01 DIAGNOSIS — I480 Paroxysmal atrial fibrillation: Secondary | ICD-10-CM

## 2019-06-01 DIAGNOSIS — E785 Hyperlipidemia, unspecified: Secondary | ICD-10-CM

## 2019-06-01 DIAGNOSIS — R002 Palpitations: Secondary | ICD-10-CM

## 2019-06-01 DIAGNOSIS — I10 Essential (primary) hypertension: Secondary | ICD-10-CM | POA: Diagnosis not present

## 2019-06-01 DIAGNOSIS — I519 Heart disease, unspecified: Secondary | ICD-10-CM | POA: Diagnosis not present

## 2019-06-01 DIAGNOSIS — Z79899 Other long term (current) drug therapy: Secondary | ICD-10-CM | POA: Diagnosis not present

## 2019-06-01 MED ORDER — METOPROLOL SUCCINATE ER 25 MG PO TB24: 75.0000 mg | ORAL_TABLET | Freq: Every day | ORAL | 3 refills | Status: DC

## 2019-06-01 NOTE — Patient Instructions (Addendum)
Medication Instructions:  INCREASE- Metoprolol Succinate 75 mg(3 tablets) by mouth daily  *If you need a refill on your cardiac medications before your next appointment, please call your pharmacy*   Lab Work: BMP, TSH, CBC and Magnesium  If you have labs (blood work) drawn today and your tests are completely normal, you will receive your results only by: Marland Kitchen MyChart Message (if you have MyChart) OR . A paper copy in the mail If you have any lab test that is abnormal or we need to change your treatment, we will call you to review the results.   Testing/Procedures: Your physician has requested that you have an echocardiogram. Echocardiography is a painless test that uses sound waves to create images of your heart. It provides your doctor with information about the size and shape of your heart and how well your heart's chambers and valves are working. This procedure takes approximately one hour. There are no restrictions for this procedure.  Your physician has recommended that you wear a 1 week Zio monitor. Holter monitors are medical devices that record the heart's electrical activity. Doctors most often use these monitors to diagnose arrhythmias. Arrhythmias are problems with the speed or rhythm of the heartbeat. The monitor is a small, portable device. You can wear one while you do your normal daily activities. This is usually used to diagnose what is causing palpitations/syncope (passing out).   Follow-Up: At [facility], you and your health needs are our priority.  As part of our continuing mission to provide you with exceptional heart care, we have created designated Provider Care Teams.  These Care Teams include your primary Cardiologist (physician) and Advanced Practice Providers (APPs -  Physician Assistants and Nurse Practitioners) who all work together to provide you with the care you need, when you need it.  We recommend signing up for the patient portal called "MyChart".  Sign up  information is provided on this After Visit Summary.  MyChart is used to connect with patients for Virtual Visits (Telemedicine).  Patients are able to view lab/test results, encounter notes, upcoming appointments, etc.  Non-urgent messages can be sent to your provider as well.   To learn more about what you can do with MyChart, go to [email or web address].    Your next appointment:   1 month(s)  The format for your next appointment:   In Person  Provider:   Kirk Ruths, MD    Other Instructions [place] Monitor Instructions   Your physician has requested you wear your ZIO patch monitor 7 days.   This is a single patch monitor.  Ir[employer] supplies one patch monitor per enrollment.  Additional stickers are not available.   Please do not apply patch if you will be having a Nuclear Stress Test, Echocardiogram, Cardiac CT, MRI, or Chest Xray during the time frame you would be wearing the monitor. The patch cannot be worn during these tests.  You cannot remove and re-apply the ZIO XT patch monitor.   Your ZIO patch monitor will be sent USPS Priority mail from [facility] directly to your home address. The monitor may also be mailed to a PO BOX if home delivery is not available.   It may take 3-5 days to receive your monitor after you have been enrolled.   Once you have received you monitor, please review enclosed instructions.  Your monitor has already been registered assigning a specific monitor serial # to you.   Applying the monitor   Shave hair from  upper left chest.   Hold abrader disc by orange tab.  Rub abrader in 40 strokes over left upper chest as indicated in your monitor instructions.   Clean area with 4 enclosed alcohol pads .  Use all pads to assure are is cleaned thoroughly.  Let dry.   Apply patch as indicated in monitor instructions.  Patch will be place under collarbone on left side of chest with arrow pointing upward.   Rub patch adhesive wings  for 2 minutes.Remove white label marked "1".  Remove white label marked "2".  Rub patch adhesive wings for 2 additional minutes.   While looking in a mirror, press and release button in center of patch.  A small green light will flash 3-4 times .  This will be your only indicator the monitor has been turned on.     Do not shower for the first 24 hours.  You may shower after the first 24 hours.   Press button if you feel a symptom. You will hear a small click.  Record Date, Time and Symptom in the Patient Log Book.   When you are ready to remove patch, follow instructions on last 2 pages of Patient Log Book.  Stick patch monitor onto last page of Patient Log Book.   Place Patient Log Book in [place] box.  Use locking tab on box and tape box closed securely.  The [employer] and [employer] has [employer] on it.  Please place in mailbox as soon as possible.  Your physician should have your test results approximately 7 days after the monitor has been mailed back to [facility].   Call [place] at [phone] if you have questions regarding your ZIO XT patch monitor.  Call them immediately if you see an orange light blinking on your monitor.   If your monitor falls off in less than 4 days contact our Monitor department at [phone].  If your monitor becomes loose or falls off after 4 days call [employer] at [phone] for suggestions on securing your monitor.

## 2019-06-01 NOTE — Telephone Encounter (Signed)
Enrolled patient for a 7 day Zio AT telemetry monitor to be mailed to patients home.

## 2019-06-02 ENCOUNTER — Telehealth: Payer: Self-pay | Admitting: Adult Health

## 2019-06-02 LAB — CBC
Hematocrit: 41.1 % (ref 37.5–51.0)
Hemoglobin: 13.8 g/dL (ref 13.0–17.7)
MCH: 31.7 pg (ref 26.6–33.0)
MCHC: 33.6 g/dL (ref 31.5–35.7)
MCV: 95 fL (ref 79–97)
Platelets: 200 10*3/uL (ref 150–450)
RBC: 4.35 x10E6/uL (ref 4.14–5.80)
RDW: 12.7 % (ref 11.6–15.4)
WBC: 5.7 10*3/uL (ref 3.4–10.8)

## 2019-06-02 LAB — BASIC METABOLIC PANEL
BUN/Creatinine Ratio: 21 (ref 10–24)
BUN: 22 mg/dL (ref 8–27)
CO2: 21 mmol/L (ref 20–29)
Calcium: 9.7 mg/dL (ref 8.6–10.2)
Chloride: 98 mmol/L (ref 96–106)
Creatinine, Ser: 1.04 mg/dL (ref 0.76–1.27)
GFR calc Af Amer: 83 mL/min/{1.73_m2} (ref >59)
GFR calc non Af Amer: 71 mL/min/{1.73_m2} (ref >59)
Glucose: 97 mg/dL (ref 65–99)
Potassium: 5.2 mmol/L (ref 3.5–5.2)
Sodium: 135 mmol/L (ref 134–144)

## 2019-06-02 LAB — MAGNESIUM: Magnesium: 2.1 mg/dL (ref 1.6–2.3)

## 2019-06-02 LAB — TSH: TSH: 1.57 u[IU]/mL (ref 0.450–4.500)

## 2019-06-02 NOTE — Telephone Encounter (Signed)
Patient is returning call in regards to results. Please advise.

## 2019-06-02 NOTE — Telephone Encounter (Signed)
Pt advised his lab results and verbalized understanding.

## 2019-06-02 NOTE — Telephone Encounter (Signed)
Follow up     Patient is returning a call to the nurse to get results.  Please call

## 2019-06-07 ENCOUNTER — Other Ambulatory Visit (INDEPENDENT_AMBULATORY_CARE_PROVIDER_SITE_OTHER): Payer: BC Managed Care – PPO

## 2019-06-07 DIAGNOSIS — I491 Atrial premature depolarization: Secondary | ICD-10-CM | POA: Diagnosis not present

## 2019-06-07 DIAGNOSIS — I493 Ventricular premature depolarization: Secondary | ICD-10-CM | POA: Diagnosis not present

## 2019-06-07 DIAGNOSIS — R002 Palpitations: Secondary | ICD-10-CM | POA: Diagnosis not present

## 2019-06-07 DIAGNOSIS — I472 Ventricular tachycardia: Secondary | ICD-10-CM | POA: Diagnosis not present

## 2019-06-07 DIAGNOSIS — I480 Paroxysmal atrial fibrillation: Secondary | ICD-10-CM

## 2019-06-08 ENCOUNTER — Telehealth: Payer: Self-pay | Admitting: *Deleted

## 2019-06-08 DIAGNOSIS — R002 Palpitations: Secondary | ICD-10-CM | POA: Diagnosis not present

## 2019-06-08 DIAGNOSIS — I480 Paroxysmal atrial fibrillation: Secondary | ICD-10-CM | POA: Diagnosis not present

## 2019-06-08 NOTE — Telephone Encounter (Signed)
Schedule AWV.  

## 2019-06-09 ENCOUNTER — Other Ambulatory Visit: Payer: Self-pay

## 2019-06-09 ENCOUNTER — Telehealth: Payer: Self-pay | Admitting: *Deleted

## 2019-06-09 ENCOUNTER — Ambulatory Visit (HOSPITAL_COMMUNITY): Payer: BC Managed Care – PPO | Attending: Internal Medicine

## 2019-06-09 DIAGNOSIS — I519 Heart disease, unspecified: Secondary | ICD-10-CM | POA: Insufficient documentation

## 2019-06-09 DIAGNOSIS — I480 Paroxysmal atrial fibrillation: Secondary | ICD-10-CM | POA: Diagnosis not present

## 2019-06-09 NOTE — Telephone Encounter (Signed)
Patient declined AWV

## 2019-06-17 DIAGNOSIS — K5130 Ulcerative (chronic) rectosigmoiditis without complications: Secondary | ICD-10-CM | POA: Diagnosis not present

## 2019-07-07 NOTE — Progress Notes (Signed)
HPI: FU atrial fibrillation. Nuclear study in February of 2011 showed an ejection fraction of 62% and normal perfusion. Patient had a monitor in February of 2012 that showed documentation of atrial fibrillation. Treated with beta blocker and apixaban. Abdominal ultrasound October 2014 showed no aneurysm.  Echo April 2021 showed normal LV function, mildly dilated ascending aorta at 40 mm.  Monitor April 2021 showed occasional PAC, PVC, brief PAT and 4 beats nonsustained ventricular tachycardia.  Patient seen recently in the office with increasing episodes that were symptomatic.  Beta-blocker increased.  Since he was last seen,he has had no further episodes of atrial fibrillation. He denies dyspnea, chest pain or syncope. No bleeding.  Current Outpatient Medications  Medication Sig Dispense Refill  . amLODipine-benazepril (LOTREL) 10-20 MG capsule TAKE 1 CAPSULE BY MOUTH DAILY 90 capsule 2  . apixaban (ELIQUIS) 5 MG TABS tablet Take 1 tablet (5 mg total) by mouth 2 (two) times daily. 180 tablet 2  . cephALEXin (KEFLEX) 500 MG capsule Take 1 capsule by mouth in the morning and at bedtime.    Marland Kitchen LORazepam (ATIVAN) 0.5 MG tablet Take 1 tablet (0.5 mg total) by mouth as needed. 30 tablet 1  . mesalamine (LIALDA) 1.2 g EC tablet Take 2 tablets (2.4 g total) by mouth daily. NEED APPOINTMENT FOR FUTURE REFILL 90 tablet 1  . metoprolol succinate (TOPROL XL) 25 MG 24 hr tablet Take 3 tablets (75 mg total) by mouth daily. 270 tablet 3  . omeprazole (PRILOSEC) 40 MG capsule TAKE 1 CAPSULE BY MOUTH EVERY DAY 30 capsule 2  . pravastatin (PRAVACHOL) 40 MG tablet Take 1 tablet (40 mg total) by mouth daily. 90 tablet 2  . predniSONE (DELTASONE) 20 MG tablet Take 1 tablet by mouth as directed.    . triamcinolone cream (KENALOG) 0.1 % Apply topically 2 (two) times daily as needed. 80 g 1   No current facility-administered medications for this visit.     Past Medical History:  Diagnosis Date  . Atrial  fibrillation ([place])   . Chest pain, unspecified   . Esophageal reflux   . Family history of ischemic heart disease   . Other and unspecified hyperlipidemia   . Palpitations   . Ulcerative colitis   . Unspecified essential hypertension     Past Surgical History:  Procedure Laterality Date  . ELBOW SURGERY     left  . KNEE ARTHROSCOPY     rihgt  . VASECTOMY      Social History   Socioeconomic History  . Marital status: Single    Spouse name: Not on file  . Number of children: Not on file  . Years of education: Not on file  . Highest education level: Not on file  Occupational History  . Not on file  Tobacco Use  . Smoking status: Former Smoker    Years: 25.00    Types: Cigarettes, Cigars  . Smokeless tobacco: Never Used  Substance and Sexual Activity  . Alcohol use: Yes    Alcohol/week: 0.0 standard drinks  . Drug use: No  . Sexual activity: Yes  Other Topics Concern  . Not on file  Social History Narrative   Married. Education: [employer]. Exercise: Weekly.   Social Determinants of Health   Financial Resource Strain:   . Difficulty of Paying Living Expenses:   Food Insecurity:   . Worried About Charity fundraiser in the Last Year:   . [place] in the Last  Year:   Transportation Needs:   . Film/video editor (Medical):   Marland Kitchen Lack of Transportation (Non-Medical):   Physical Activity:   . Days of Exercise per Week:   . Minutes of Exercise per Session:   Stress:   . Feeling of Stress :   Social Connections:   . Frequency of Communication with Friends and Family:   . Frequency of Social Gatherings with Friends and Family:   . Attends Religious Services:   . Active Member of Clubs or Organizations:   . Attends Archivist Meetings:   Marland Kitchen Marital Status:   Intimate Partner Violence:   . Fear of Current or Ex-Partner:   . Emotionally Abused:   Marland Kitchen Physically Abused:   . Sexually Abused:     Family History  Problem Relation Age of Onset  .  Stroke Brother   . Coronary artery disease Other        family history    ROS: no fevers or chills, productive cough, hemoptysis, dysphasia, odynophagia, melena, hematochezia, dysuria, hematuria, rash, seizure activity, orthopnea, PND, pedal edema, claudication. Remaining systems are negative.  Physical Exam: Well-developed well-nourished in no acute distress.  Skin is warm and dry.  HEENT is normal.  Neck is supple.  Chest is clear to auscultation with normal expansion.  Cardiovascular exam is regular rate and rhythm.  Abdominal exam nontender or distended. No masses palpated. Extremities show no edema. neuro grossly intact  A/P  1 paroxysmal atrial fibrillation-patient remains in sinus rhythm.  Continue beta-blocker at present dose.  Continue apixaban. We can consider adding an antiarrhythmic versus referral for ablation in the future if he has more frequent episodes.  2 hypertension-patient's blood pressure is controlled.  Continue present medications.  3 hyperlipidemia-continue statin.  Kirk Ruths, MD

## 2019-07-12 ENCOUNTER — Other Ambulatory Visit: Payer: Self-pay

## 2019-07-12 ENCOUNTER — Ambulatory Visit (INDEPENDENT_AMBULATORY_CARE_PROVIDER_SITE_OTHER): Payer: BC Managed Care – PPO | Admitting: Cardiology

## 2019-07-12 ENCOUNTER — Encounter: Payer: Self-pay | Admitting: Cardiology

## 2019-07-12 VITALS — BP 138/82 | HR 69 | Ht 73.0 in | Wt 206.4 lb

## 2019-07-12 DIAGNOSIS — I480 Paroxysmal atrial fibrillation: Secondary | ICD-10-CM | POA: Diagnosis not present

## 2019-07-12 DIAGNOSIS — I10 Essential (primary) hypertension: Secondary | ICD-10-CM | POA: Diagnosis not present

## 2019-07-12 DIAGNOSIS — E785 Hyperlipidemia, unspecified: Secondary | ICD-10-CM

## 2019-07-12 NOTE — Patient Instructions (Signed)
Medication Instructions:  NO CHANGE *If you need a refill on your cardiac medications before your next appointment, please call your pharmacy*   Lab Work: If you have labs (blood work) drawn today and your tests are completely normal, you will receive your results only by: Marland Kitchen MyChart Message (if you have MyChart) OR . A paper copy in the mail If you have any lab test that is abnormal or we need to change your treatment, we will call you to review the results.  Follow-Up: At [facility], you and your health needs are our priority.  As part of our continuing mission to provide you with exceptional heart care, we have created designated Provider Care Teams.  These Care Teams include your primary Cardiologist (physician) and Advanced Practice Providers (APPs -  Physician Assistants and Nurse Practitioners) who all work together to provide you with the care you need, when you need it.  We recommend signing up for the patient portal called "MyChart".  Sign up information is provided on this After Visit Summary.  MyChart is used to connect with patients for Virtual Visits (Telemedicine).  Patients are able to view lab/test results, encounter notes, upcoming appointments, etc.  Non-urgent messages can be sent to your provider as well.   To learn more about what you can do with MyChart, go to [email or web address].    Your next appointment:   6 month(s)  The format for your next appointment:   Either In Person or Virtual  Provider:   You may see Kirk Ruths MD or one of the following Advanced Practice Providers on your designated Care Team:    Kerin Ransom, PA-C  [place], [place]  Coletta Memos, [place]

## 2019-08-01 ENCOUNTER — Other Ambulatory Visit: Payer: Self-pay | Admitting: Family Medicine

## 2019-08-01 DIAGNOSIS — K219 Gastro-esophageal reflux disease without esophagitis: Secondary | ICD-10-CM

## 2019-08-01 MED ORDER — OMEPRAZOLE 40 MG PO CPDR: 40.0000 mg | DELAYED_RELEASE_CAPSULE | Freq: Every day | ORAL | 1 refills | Status: DC

## 2019-08-01 NOTE — Telephone Encounter (Signed)
Copied from [place] [phone]. Topic: Quick Communication - Rx Refill/Question >> Aug 01, 2019  8:16 AM Leward Quan A wrote: Medication: omeprazole (PRILOSEC) 40 MG capsule    Has the patient contacted their pharmacy? Yes.   (Agent: If no, request that the patient contact the pharmacy for the refill.) (Agent: If yes, when and what did the pharmacy advise?)  Preferred Pharmacy (with phone number or street name): [id number] [place] Phone:  [phone]:  [phone]    Agent: Please be advised that RX refills may take up to 3 business days. We ask that you follow-up with your pharmacy.

## 2019-09-03 DIAGNOSIS — R220 Localized swelling, mass and lump, head: Secondary | ICD-10-CM | POA: Diagnosis not present

## 2019-09-22 ENCOUNTER — Other Ambulatory Visit: Payer: Self-pay | Admitting: Cardiology

## 2019-09-22 DIAGNOSIS — I480 Paroxysmal atrial fibrillation: Secondary | ICD-10-CM

## 2019-10-03 ENCOUNTER — Ambulatory Visit (INDEPENDENT_AMBULATORY_CARE_PROVIDER_SITE_OTHER): Payer: BC Managed Care – PPO | Admitting: Family Medicine

## 2019-10-03 ENCOUNTER — Encounter: Payer: Self-pay | Admitting: Family Medicine

## 2019-10-03 ENCOUNTER — Other Ambulatory Visit: Payer: Self-pay

## 2019-10-03 VITALS — BP 111/68 | HR 63 | Temp 98.0°F | Ht 73.0 in | Wt 207.2 lb

## 2019-10-03 DIAGNOSIS — R252 Cramp and spasm: Secondary | ICD-10-CM | POA: Diagnosis not present

## 2019-10-03 DIAGNOSIS — K219 Gastro-esophageal reflux disease without esophagitis: Secondary | ICD-10-CM | POA: Diagnosis not present

## 2019-10-03 DIAGNOSIS — I10 Essential (primary) hypertension: Secondary | ICD-10-CM

## 2019-10-03 DIAGNOSIS — E785 Hyperlipidemia, unspecified: Secondary | ICD-10-CM

## 2019-10-03 MED ORDER — OMEPRAZOLE 40 MG PO CPDR: 40.0000 mg | DELAYED_RELEASE_CAPSULE | Freq: Every day | ORAL | 1 refills | Status: DC

## 2019-10-03 MED ORDER — PRAVASTATIN SODIUM 40 MG PO TABS: 40.0000 mg | ORAL_TABLET | Freq: Every day | ORAL | 2 refills | Status: DC

## 2019-10-03 MED ORDER — AMLODIPINE BESY-BENAZEPRIL HCL 10-20 MG PO CAPS: 1.0000 | ORAL_CAPSULE | Freq: Every day | ORAL | 2 refills | Status: DC

## 2019-10-03 NOTE — Progress Notes (Signed)
Subjective:  Patient ID: Joseph Smith, male    DOB: 1945/08/18  Age: 74 y.o. MRN: [id number]  CC:  Chief Complaint  Patient presents with  . Medical Management of Chronic Issues    6 f/u med review    HPI KASAI BELTRAN presents for   Hypertension: With hx of atrial fibrillation.  On eliquis for anticoag, toprol rate control - 83m qd.  lotrel 10/275mqd.  Cards: [place].  Home readings: none.  Notices some cramps in feet, calves at times at night. Has to stand up and stretch. No daytime symptoms.   BP Readings from Last 3 Encounters:  10/03/19 111/68  07/12/19 138/82  06/01/19 130/70   Lab Results  Component Value Date   CREATININE 1.04 06/01/2019   Hyperlipidemia: pravachol 4011md.  No new myalgias.  Lab Results  Component Value Date   CHOL 175 04/04/2019   HDL 78 04/04/2019   LDLCALC 85 04/04/2019   TRIG 60 04/04/2019   CHOLHDL 2.2 04/04/2019   Lab Results  Component Value Date   ALT 18 04/04/2019   AST 20 04/04/2019   ALKPHOS 50 04/04/2019   BILITOT 0.3 04/04/2019   GERD:  prilosec 97m5m. Hx of ulcerative colitis, on Lialda prior GI: Dr. GanePenelope Cooptable with current dosage.  Prednisone only as needed for flairs.   History Patient Active Problem List   Diagnosis Date Noted  . Ulcerative colitis (HCC)[place]/06/2016  . Bruit 12/03/2012  . Hyperglycemia 02/05/2011  . Impotence due to erectile dysfunction 02/05/2011  . ATRIAL FIBRILLATION 05/10/2010  . PALPITATIONS 04/23/2010  . CHEST PAIN-UNSPECIFIED 04/17/2009  . Hyperlipidemia 04/11/2009  . Essential hypertension 04/11/2009  . GERD 04/11/2009   Past Medical History:  Diagnosis Date  . Atrial fibrillation (HCC)[place]. Chest pain, unspecified   . Esophageal reflux   . Family history of ischemic heart disease   . Other and unspecified hyperlipidemia   . Palpitations   . Ulcerative colitis   . Unspecified essential hypertension    Past Surgical History:  Procedure Laterality Date  .  ELBOW SURGERY     left  . KNEE ARTHROSCOPY     rihgt  . VASECTOMY     Allergies  Allergen Reactions  . Codeine Itching    REACTION: Reaction not known   Prior to Admission medications   Medication Sig Start Date End Date Taking? Authorizing Provider  amLODipine-benazepril (LOTREL) 10-20 MG capsule TAKE 1 CAPSULE BY MOUTH DAILY 05/24/19  Yes GreeWendie Agreste  ELIQUIS 5 MG TABS tablet TAKE 1 TABLET(5 MG) BY MOUTH TWICE DAILY 09/22/19  Yes CrenLelon Perla  metoprolol succinate (TOPROL XL) 25 MG 24 hr tablet Take 3 tablets (75 mg total) by mouth daily. 06/01/19  Yes LawrLendon Colonel  omeprazole (PRILOSEC) 40 MG capsule Take 1 capsule (40 mg total) by mouth daily. 08/01/19  Yes GreeWendie Agreste  pravastatin (PRAVACHOL) 40 MG tablet Take 1 tablet (40 mg total) by mouth daily. 04/04/19  Yes GreeWendie Agreste  triamcinolone cream (KENALOG) 0.1 % Apply topically 2 (two) times daily as needed. 04/04/19  Yes GreeWendie Agreste  LORazepam (ATIVAN) 0.5 MG tablet Take 1 tablet (0.5 mg total) by mouth as needed. Patient not taking: Reported on 10/03/2019 03/17/18   GreeWendie Agreste  mesalamine (LIALDA) 1.2 g EC tablet Take 2 tablets (2.4 g total) by mouth daily. NEED APPOINTMENT FOR FUTURE REFILL Patient not taking: Reported  on 10/03/2019 04/27/19   Lelon Perla, MD   Social History   Socioeconomic History  . Marital status: Single    Spouse name: Not on file  . Number of children: Not on file  . Years of education: Not on file  . Highest education level: Not on file  Occupational History  . Not on file  Tobacco Use  . Smoking status: Former Smoker    Years: 25.00    Types: Cigarettes, Cigars  . Smokeless tobacco: Never Used  Substance and Sexual Activity  . Alcohol use: Yes    Alcohol/week: 0.0 standard drinks  . Drug use: No  . Sexual activity: Yes  Other Topics Concern  . Not on file  Social History Narrative   Married. Education: [employer]. Exercise: Weekly.     Social Determinants of Health   Financial Resource Strain:   . Difficulty of Paying Living Expenses:   Food Insecurity:   . Worried About Charity fundraiser in the Last Year:   . Arboriculturist in the Last Year:   Transportation Needs:   . Film/video editor (Medical):   Marland Kitchen Lack of Transportation (Non-Medical):   Physical Activity:   . Days of Exercise per Week:   . Minutes of Exercise per Session:   Stress:   . Feeling of Stress :   Social Connections:   . Frequency of Communication with Friends and Family:   . Frequency of Social Gatherings with Friends and Family:   . Attends Religious Services:   . Active Member of Clubs or Organizations:   . Attends Archivist Meetings:   Marland Kitchen Marital Status:   Intimate Partner Violence:   . Fear of Current or Ex-Partner:   . Emotionally Abused:   Marland Kitchen Physically Abused:   . Sexually Abused:     Review of Systems  Constitutional: Negative for fatigue and unexpected weight change.  Eyes: Negative for visual disturbance.  Respiratory: Negative for cough, chest tightness and shortness of breath.   Cardiovascular: Negative for chest pain, palpitations and leg swelling.  Gastrointestinal: Negative for abdominal pain and blood in stool.  Genitourinary: Negative for hematuria.  Neurological: Negative for dizziness, light-headedness and headaches.    Objective:   Vitals:   10/03/19 1312  BP: 111/68  Pulse: 63  Temp: 98 F (36.7 C)  TempSrc: Temporal  SpO2: 99%  Weight: 207 lb 3.2 oz (94 kg)  Height: 6' 1"  (1.854 m)     Physical Exam Vitals reviewed.  Constitutional:      Appearance: He is well-developed.  HENT:     Head: Normocephalic and atraumatic.  Eyes:     Pupils: Pupils are equal, round, and reactive to light.  Neck:     Vascular: No carotid bruit or JVD.  Cardiovascular:     Rate and Rhythm: Normal rate and regular rhythm.     Heart sounds: Normal heart sounds. No murmur heard.   Pulmonary:      Effort: Pulmonary effort is normal.     Breath sounds: Normal breath sounds. No rales.  Musculoskeletal:     Cervical back: No tenderness.  Skin:    General: Skin is warm and dry.  Neurological:     Mental Status: He is alert and oriented to person, place, and time.        Assessment & Plan:  DAYVON DAX is a 74 y.o. male . Cramp of both lower extremities - Plan: Magnesium  -Episodic at night,  stretches discussed, hydration, check electrolytes including magnesium with RTC precautions if persistent  Essential hypertension - Plan: amLODipine-benazepril (LOTREL) 10-20 MG capsule, Comprehensive metabolic panel   Stable, tolerating current regimen. Medications refilled. Labs pending as above.  Atrial fibrillation also stable, has ongoing follow-up with cardiology  Gastroesophageal reflux disease - Plan: omeprazole (PRILOSEC) 40 MG capsule  -  Stable, tolerating current regimen. Medications refilled. Labs pending as above.  Ulcerative colitis also stable with rare prednisone, off Lialda.  Hyperlipidemia, unspecified hyperlipidemia type - Plan: pravastatin (PRAVACHOL) 40 MG tablet, Comprehensive metabolic panel, Lipid panel  -  Stable, tolerating current regimen. Medications refilled. Labs pending as above.    Meds ordered this encounter  Medications  . amLODipine-benazepril (LOTREL) 10-20 MG capsule    Sig: Take 1 capsule by mouth daily.    Dispense:  90 capsule    Refill:  2  . omeprazole (PRILOSEC) 40 MG capsule    Sig: Take 1 capsule (40 mg total) by mouth daily.    Dispense:  90 capsule    Refill:  1  . pravastatin (PRAVACHOL) 40 MG tablet    Sig: Take 1 tablet (40 mg total) by mouth daily.    Dispense:  90 tablet    Refill:  2   Patient Instructions     Try stretching out your legs specifically your calves before you go to sleep at night.  If there are any electrolyte issues today I will let you know.  Make sure to drink plenty of water.  If this cramps or leg  movements are not improving in the next few weeks or worsening sooner come back and see me.  Return to the clinic or go to the nearest emergency room if any of your symptoms worsen or new symptoms occur.  Good talking to you today.   If you have lab work done today you will be contacted with your lab results within the next 2 weeks.  If you have not heard from [place] then please contact us. The fastest way to get your results is to register for My Chart.   IF you received an x-ray today, you will receive an invoice from [facility]. Please contact [facility] at [phone] with questions or concerns regarding your invoice.   IF you received labwork today, you will receive an invoice from [facility]. Please contact [facility] at [phone] with questions or concerns regarding your invoice.   Our billing staff will not be able to assist you with questions regarding bills from these companies.  You will be contacted with the lab results as soon as they are available. The fastest way to get your results is to activate your My Chart account. Instructions are located on the last page of this paperwork. If you have not heard from [place] regarding the results in 2 weeks, please contact this office.          Signed, Merri Ray, MD [facility]

## 2019-10-03 NOTE — Patient Instructions (Addendum)
   Try stretching out your legs specifically your calves before you go to sleep at night.  If there are any electrolyte issues today I will let you know.  Make sure to drink plenty of water.  If this cramps or leg movements are not improving in the next few weeks or worsening sooner come back and see me.  Return to the clinic or go to the nearest emergency room if any of your symptoms worsen or new symptoms occur.  Good talking to you today.   If you have lab work done today you will be contacted with your lab results within the next 2 weeks.  If you have not heard from [place] then please contact us. The fastest way to get your results is to register for My Chart.   IF you received an x-ray today, you will receive an invoice from [facility]. Please contact [facility] at [phone] with questions or concerns regarding your invoice.   IF you received labwork today, you will receive an invoice from [place]. Please contact [facility] at [phone] with questions or concerns regarding your invoice.   Our billing staff will not be able to assist you with questions regarding bills from these companies.  You will be contacted with the lab results as soon as they are available. The fastest way to get your results is to activate your My Chart account. Instructions are located on the last page of this paperwork. If you have not heard from [place] regarding the results in 2 weeks, please contact this office.

## 2019-10-04 LAB — COMPREHENSIVE METABOLIC PANEL
ALT: 15 IU/L (ref 0–44)
AST: 15 IU/L (ref 0–40)
Albumin/Globulin Ratio: 1.7 (ref 1.2–2.2)
Albumin: 4.5 g/dL (ref 3.7–4.7)
Alkaline Phosphatase: 46 IU/L — ABNORMAL LOW (ref 48–121)
BUN/Creatinine Ratio: 18 (ref 10–24)
BUN: 22 mg/dL (ref 8–27)
Bilirubin Total: 0.4 mg/dL (ref 0.0–1.2)
CO2: 22 mmol/L (ref 20–29)
Calcium: 9.2 mg/dL (ref 8.6–10.2)
Chloride: 97 mmol/L (ref 96–106)
Creatinine, Ser: 1.2 mg/dL (ref 0.76–1.27)
GFR calc Af Amer: 69 mL/min/{1.73_m2} (ref >59)
GFR calc non Af Amer: 60 mL/min/{1.73_m2} (ref >59)
Globulin, Total: 2.6 g/dL (ref 1.5–4.5)
Glucose: 120 mg/dL — ABNORMAL HIGH (ref 65–99)
Potassium: 4 mmol/L (ref 3.5–5.2)
Sodium: 133 mmol/L — ABNORMAL LOW (ref 134–144)
Total Protein: 7.1 g/dL (ref 6.0–8.5)

## 2019-10-04 LAB — LIPID PANEL
Chol/HDL Ratio: 1.9 ratio (ref 0.0–5.0)
Cholesterol, Total: 206 mg/dL — ABNORMAL HIGH (ref 100–199)
HDL: 110 mg/dL (ref >39)
LDL Chol Calc (NIH): 83 mg/dL (ref 0–99)
Triglycerides: 72 mg/dL (ref 0–149)
VLDL Cholesterol Cal: 13 mg/dL (ref 5–40)

## 2019-10-04 LAB — MAGNESIUM: Magnesium: 1.9 mg/dL (ref 1.6–2.3)

## 2019-10-07 ENCOUNTER — Other Ambulatory Visit: Payer: Self-pay | Admitting: Family Medicine

## 2019-10-07 DIAGNOSIS — E785 Hyperlipidemia, unspecified: Secondary | ICD-10-CM

## 2019-10-10 ENCOUNTER — Other Ambulatory Visit: Payer: Self-pay | Admitting: Family Medicine

## 2019-10-10 DIAGNOSIS — E785 Hyperlipidemia, unspecified: Secondary | ICD-10-CM

## 2019-10-10 MED ORDER — PRAVASTATIN SODIUM 40 MG PO TABS: 40.0000 mg | ORAL_TABLET | Freq: Every day | ORAL | 2 refills | Status: DC

## 2019-10-10 NOTE — Telephone Encounter (Signed)
Medication Refill - Medication: Pravastatin 30m  Has the patient contacted their pharmacy? Yes.   Pharmacy hasn't received request from PCP. (Agent: If no, request that the patient contact the pharmacy for the refill.) (Agent: If yes, when and what did the pharmacy advise?)  Preferred Pharmacy (with phone number or street name): [facility]  Agent: Please be advised that RX refills may take up to 3 business days. We ask that you follow-up with your pharmacy.

## 2019-12-09 ENCOUNTER — Other Ambulatory Visit: Payer: Self-pay

## 2019-12-09 ENCOUNTER — Ambulatory Visit (INDEPENDENT_AMBULATORY_CARE_PROVIDER_SITE_OTHER): Payer: BC Managed Care – PPO | Admitting: Registered Nurse

## 2019-12-09 VITALS — BP 134/81 | HR 70 | Temp 98.3°F | Resp 16 | Ht 73.0 in | Wt 206.8 lb

## 2019-12-09 DIAGNOSIS — R238 Other skin changes: Secondary | ICD-10-CM

## 2019-12-09 DIAGNOSIS — Z23 Encounter for immunization: Secondary | ICD-10-CM | POA: Diagnosis not present

## 2019-12-09 DIAGNOSIS — R233 Spontaneous ecchymoses: Secondary | ICD-10-CM

## 2019-12-09 NOTE — Patient Instructions (Signed)
° ° ° °  If you have lab work done today you will be contacted with your lab results within the next 2 weeks.  If you have not heard from us then please contact us. The fastest way to get your results is to register for My Chart. ° ° °IF you received an x-ray today, you will receive an invoice from [facility]. Please contact [facility] at [phone] with questions or concerns regarding your invoice.  ° °IF you received labwork today, you will receive an invoice from [facility]. Please contact [facility] at [phone] with questions or concerns regarding your invoice.  ° °Our billing staff will not be able to assist you with questions regarding bills from these companies. ° °You will be contacted with the lab results as soon as they are available. The fastest way to get your results is to activate your My Chart account. Instructions are located on the last page of this paperwork. If you have not heard from us regarding the results in 2 weeks, please contact this office. °  ° ° ° °

## 2019-12-10 LAB — BASIC METABOLIC PANEL
BUN/Creatinine Ratio: 21 (ref 10–24)
BUN: 20 mg/dL (ref 8–27)
CO2: 21 mmol/L (ref 20–29)
Calcium: 8.8 mg/dL (ref 8.6–10.2)
Chloride: 98 mmol/L (ref 96–106)
Creatinine, Ser: 0.97 mg/dL (ref 0.76–1.27)
GFR calc Af Amer: 89 mL/min/{1.73_m2} (ref >59)
GFR calc non Af Amer: 77 mL/min/{1.73_m2} (ref >59)
Glucose: 98 mg/dL (ref 65–99)
Potassium: 4.1 mmol/L (ref 3.5–5.2)
Sodium: 136 mmol/L (ref 134–144)

## 2019-12-10 LAB — PROTIME-INR
INR: 1 (ref 0.9–1.2)
Prothrombin Time: 10.2 s (ref 9.1–12.0)

## 2019-12-10 LAB — CBC
Hematocrit: 36.2 % — ABNORMAL LOW (ref 37.5–51.0)
Hemoglobin: 12.1 g/dL — ABNORMAL LOW (ref 13.0–17.7)
MCH: 30.9 pg (ref 26.6–33.0)
MCHC: 33.4 g/dL (ref 31.5–35.7)
MCV: 92 fL (ref 79–97)
Platelets: 290 10*3/uL (ref 150–450)
RBC: 3.92 x10E6/uL — ABNORMAL LOW (ref 4.14–5.80)
RDW: 13 % (ref 11.6–15.4)
WBC: 7.1 10*3/uL (ref 3.4–10.8)

## 2019-12-13 NOTE — Progress Notes (Signed)
Good morning -   Letter please:  Mr. Tagle -   Labs are reassuring. Showing a very mild anemia likely unrelated to current blood thinner use. You've been worse in the past so I wouldn't sweat this. Keep an eye on bruising and bleeding and let me know if anything changes.  Thank you,  Kathrin Ruddy, NP

## 2019-12-13 NOTE — Progress Notes (Signed)
Letter sent.

## 2019-12-19 ENCOUNTER — Telehealth: Payer: Self-pay | Admitting: Family Medicine

## 2019-12-19 NOTE — Telephone Encounter (Signed)
Patient would like a call back with his recent lab results

## 2019-12-20 DIAGNOSIS — R197 Diarrhea, unspecified: Secondary | ICD-10-CM | POA: Diagnosis not present

## 2019-12-20 DIAGNOSIS — Z1211 Encounter for screening for malignant neoplasm of colon: Secondary | ICD-10-CM | POA: Diagnosis not present

## 2019-12-20 DIAGNOSIS — R1030 Lower abdominal pain, unspecified: Secondary | ICD-10-CM | POA: Diagnosis not present

## 2019-12-20 DIAGNOSIS — K625 Hemorrhage of anus and rectum: Secondary | ICD-10-CM | POA: Diagnosis not present

## 2019-12-20 DIAGNOSIS — K5130 Ulcerative (chronic) rectosigmoiditis without complications: Secondary | ICD-10-CM | POA: Diagnosis not present

## 2019-12-20 DIAGNOSIS — D649 Anemia, unspecified: Secondary | ICD-10-CM | POA: Diagnosis not present

## 2020-01-22 ENCOUNTER — Other Ambulatory Visit: Payer: Self-pay | Admitting: Family Medicine

## 2020-01-22 DIAGNOSIS — K219 Gastro-esophageal reflux disease without esophagitis: Secondary | ICD-10-CM

## 2020-02-05 ENCOUNTER — Encounter: Payer: Self-pay | Admitting: Registered Nurse

## 2020-02-05 NOTE — Progress Notes (Signed)
Acute Office Visit  Subjective:    Patient ID: Joseph Smith, male    DOB: 04-Jun-1945, 74 y.o.   MRN: [id number]  Chief Complaint  Patient presents with  . Bleeding/Bruising    pt noticed after last few months he has had more bruises and easier for them to be caused, pt notes his mother had the same issue, pt also notes skin is very thin and bleeds easily     HPI Patient is in today for easy bruising/bleeding  Ongoing for the past few months. Unsure of start. No changes that he can think of to instigate this. Has not happened before. Has been on thinners. Admits his wife is prompting much of his concern. No further CV symptoms.  No blood in stool. No lightheadedness, dizziness, shob.  Past Medical History:  Diagnosis Date  . Atrial fibrillation ([place])   . Chest pain, unspecified   . Esophageal reflux   . Family history of ischemic heart disease   . Other and unspecified hyperlipidemia   . Palpitations   . Ulcerative colitis   . Unspecified essential hypertension     Past Surgical History:  Procedure Laterality Date  . ELBOW SURGERY     left  . KNEE ARTHROSCOPY     rihgt  . VASECTOMY      Family History  Problem Relation Age of Onset  . Stroke Brother   . Coronary artery disease Other        family history    Social History   Socioeconomic History  . Marital status: Single    Spouse name: Not on file  . Number of children: Not on file  . Years of education: Not on file  . Highest education level: Not on file  Occupational History  . Not on file  Tobacco Use  . Smoking status: Former Smoker    Years: 25.00    Types: Cigarettes, Cigars  . Smokeless tobacco: Never Used  Substance and Sexual Activity  . Alcohol use: Yes    Alcohol/week: 0.0 standard drinks  . Drug use: No  . Sexual activity: Yes  Other Topics Concern  . Not on file  Social History Narrative   Married. Education: [employer]. Exercise: Weekly.   Social Determinants of Health    Financial Resource Strain: Not on file  Food Insecurity: Not on file  Transportation Needs: Not on file  Physical Activity: Not on file  Stress: Not on file  Social Connections: Not on file  Intimate Partner Violence: Not on file    Outpatient Medications Prior to Visit  Medication Sig Dispense Refill  . amLODipine-benazepril (LOTREL) 10-20 MG capsule Take 1 capsule by mouth daily. 90 capsule 2  . ELIQUIS 5 MG TABS tablet TAKE 1 TABLET(5 MG) BY MOUTH TWICE DAILY 180 tablet 2  . metoprolol succinate (TOPROL XL) 25 MG 24 hr tablet Take 3 tablets (75 mg total) by mouth daily. 270 tablet 3  . pravastatin (PRAVACHOL) 40 MG tablet Take 1 tablet (40 mg total) by mouth daily. 90 tablet 2  . triamcinolone cream (KENALOG) 0.1 % Apply topically 2 (two) times daily as needed. 80 g 1  . omeprazole (PRILOSEC) 40 MG capsule Take 1 capsule (40 mg total) by mouth daily. 90 capsule 1   No facility-administered medications prior to visit.    Allergies  Allergen Reactions  . Codeine Itching    REACTION: Reaction not known    Review of Systems  Constitutional: Negative.   HENT: Negative.  Eyes: Negative.   Respiratory: Negative.   Cardiovascular: Negative.   Gastrointestinal: Negative.   Genitourinary: Negative.   Musculoskeletal: Negative.   Skin: Negative.   Neurological: Negative.   Psychiatric/Behavioral: Negative.   All other systems reviewed and are negative.      Objective:    Physical Exam Vitals and nursing note reviewed.  Constitutional:      General: He is not in acute distress.    Appearance: Normal appearance. He is normal weight. He is not ill-appearing, toxic-appearing or diaphoretic.  Cardiovascular:     Rate and Rhythm: Normal rate and regular rhythm.     Heart sounds: Normal heart sounds.  Pulmonary:     Effort: Pulmonary effort is normal. No respiratory distress.     Breath sounds: Normal breath sounds.  Musculoskeletal:        General: Normal range of  motion.  Skin:    General: Skin is warm and dry.  Neurological:     General: No focal deficit present.     Mental Status: He is alert and oriented to person, place, and time. Mental status is at baseline.  Psychiatric:        Mood and Affect: Mood normal.        Behavior: Behavior normal.        Thought Content: Thought content normal.        Judgment: Judgment normal.     BP 134/81   Pulse 70   Temp 98.3 F (36.8 C) (Temporal)   Resp 16   Ht 6' 1"  (1.854 m)   Wt 206 lb 12.8 oz (93.8 kg)   SpO2 97%   BMI 27.28 kg/m  Wt Readings from Last 3 Encounters:  12/09/19 206 lb 12.8 oz (93.8 kg)  10/03/19 207 lb 3.2 oz (94 kg)  07/12/19 206 lb 6.4 oz (93.6 kg)    Health Maintenance Due  Topic Date Due  . COVID-19 Vaccine (3 - Booster) 11/11/2019    There are no preventive care reminders to display for this patient.   Lab Results  Component Value Date   TSH 1.570 06/01/2019   Lab Results  Component Value Date   WBC 7.1 12/09/2019   HGB 12.1 (L) 12/09/2019   HCT 36.2 (L) 12/09/2019   MCV 92 12/09/2019   PLT 290 12/09/2019   Lab Results  Component Value Date   NA 136 12/09/2019   K 4.1 12/09/2019   CO2 21 12/09/2019   GLUCOSE 98 12/09/2019   BUN 20 12/09/2019   CREATININE 0.97 12/09/2019   BILITOT 0.4 10/03/2019   ALKPHOS 46 (L) 10/03/2019   AST 15 10/03/2019   ALT 15 10/03/2019   PROT 7.1 10/03/2019   ALBUMIN 4.5 10/03/2019   CALCIUM 8.8 12/09/2019   GFR 87.08 04/23/2010   Lab Results  Component Value Date   CHOL 206 (H) 10/03/2019   Lab Results  Component Value Date   HDL 110 10/03/2019   Lab Results  Component Value Date   LDLCALC 83 10/03/2019   Lab Results  Component Value Date   TRIG 72 10/03/2019   Lab Results  Component Value Date   CHOLHDL 1.9 10/03/2019   Lab Results  Component Value Date   HGBA1C 5.5 04/04/2019       Assessment & Plan:   Problem List Items Addressed This Visit   None   Visit Diagnoses    Need for  prophylactic vaccination and inoculation against influenza    -  Primary  Relevant Orders   Flu Vaccine QUAD High Dose(Fluad) (Completed)   Easy bruising       Relevant Orders   Basic Metabolic Panel (Completed)   CBC (Completed)   Protime-INR (Completed)       No orders of the defined types were placed in this encounter.  PLAN  Likely of no acute concern, given overall safety of eliquis.  Will draw labs  Return prn  No evidence of internal or GI bleeding  Patient encouraged to call clinic with any questions, comments, or concerns.  Maximiano Coss, NP

## 2020-02-09 DIAGNOSIS — R1030 Lower abdominal pain, unspecified: Secondary | ICD-10-CM | POA: Diagnosis not present

## 2020-02-09 DIAGNOSIS — K5130 Ulcerative (chronic) rectosigmoiditis without complications: Secondary | ICD-10-CM | POA: Diagnosis not present

## 2020-02-09 DIAGNOSIS — K625 Hemorrhage of anus and rectum: Secondary | ICD-10-CM | POA: Diagnosis not present

## 2020-02-10 ENCOUNTER — Other Ambulatory Visit: Payer: Self-pay

## 2020-02-10 ENCOUNTER — Other Ambulatory Visit: Payer: Self-pay | Admitting: Physician Assistant

## 2020-02-10 ENCOUNTER — Ambulatory Visit
Admission: RE | Admit: 2020-02-10 | Discharge: 2020-02-10 | Disposition: A | Discharge status: Home / Self Care | Payer: Medicare Other | Source: Ambulatory Visit | Attending: Physician Assistant | Admitting: Physician Assistant

## 2020-02-10 DIAGNOSIS — K529 Noninfective gastroenteritis and colitis, unspecified: Secondary | ICD-10-CM | POA: Diagnosis not present

## 2020-02-10 DIAGNOSIS — K5100 Ulcerative (chronic) pancolitis without complications: Secondary | ICD-10-CM | POA: Diagnosis not present

## 2020-02-10 DIAGNOSIS — K51319 Ulcerative (chronic) rectosigmoiditis with unspecified complications: Secondary | ICD-10-CM

## 2020-02-10 DIAGNOSIS — K5190 Ulcerative colitis, unspecified, without complications: Secondary | ICD-10-CM | POA: Diagnosis not present

## 2020-02-10 DIAGNOSIS — R1030 Lower abdominal pain, unspecified: Secondary | ICD-10-CM | POA: Diagnosis not present

## 2020-02-10 DIAGNOSIS — K5130 Ulcerative (chronic) rectosigmoiditis without complications: Secondary | ICD-10-CM | POA: Diagnosis not present

## 2020-02-10 DIAGNOSIS — K4020 Bilateral inguinal hernia, without obstruction or gangrene, not specified as recurrent: Secondary | ICD-10-CM | POA: Diagnosis not present

## 2020-02-10 DIAGNOSIS — K625 Hemorrhage of anus and rectum: Secondary | ICD-10-CM | POA: Diagnosis not present

## 2020-02-10 MED ORDER — IOPAMIDOL (ISOVUE-300) INJECTION 61%
100.0000 mL | Freq: Once | INTRAVENOUS | Status: AC | PRN
  Administered 2020-02-10: 100 mL via INTRAVENOUS

## 2020-02-23 DIAGNOSIS — G4701 Insomnia due to medical condition: Secondary | ICD-10-CM | POA: Diagnosis not present

## 2020-02-23 DIAGNOSIS — K5130 Ulcerative (chronic) rectosigmoiditis without complications: Secondary | ICD-10-CM | POA: Diagnosis not present

## 2020-04-01 NOTE — Progress Notes (Signed)
Cardiology Office Note:    Date:  04/04/2020   ID:  Joseph Smith, DOB 04/07/1945, MRN [id number]  PCP:  Wendie Agreste, MD  Cardiologist:  Kirk Ruths, MD  Electrophysiologist:  None   Referring MD: Wendie Agreste, MD   Chief Complaint: follow-up of atrial fibrillation  History of Present Illness:    Joseph Smith is a 75 y.o. male with a history of paroxysmal atrial fibrillation on Eliquis, hypertension, hyperlipemia, and GERD who is followed by Dr. Stanford Breed and presents today for routine follow-up.  Patient has a history of chest pain. Nuclear stress test in 2011 was negative with normal perfusion. He was diagnosed with atrial fibrillation on a Monitor in 03/2010 and was started on beta-blocker and Eliquis. At office visit in 05/2019, he reported palpitations with dizziness and near syncope but no syncope. Repeat monitor was ordered and showed underlying sinus rhythm with occasional PACs and rate PVCs, brief PAT, and short runs of NSVT. Echo showed LVEF of 60-65% with normal wall motion and diastolic function as well as mild dilatation of the ascending aorta. Beta-blocker was increased. Patient was last seen by Dr. Stanford Breed in 06/2019 at which time he was doing well with no recurrence of atrial fibrillation.  Patient presents today for routine follow-up. Here alone. Patient states he is doing very well. No cardiac complaints today. No chest pain, shortness of breath, orthopnea, or PND. No palpitations. He notes occasional lightheadedness/dizziness with quick position changes. He states this is very mild and does not last long. No syncope. He does have some mild lower extremity edema mostly around his ankles since he started Prednisone for a colitis flair. Left ankle slightly larger than right. He does not usually have edema when not on Prednisone. He is about to stop Prednisone and is about to start Humara. Compliant with Eliquis - no blood stools since treatment of colitis flair. No  other abnormal breathing.   Past Medical History:  Diagnosis Date  . Atrial fibrillation ([place])   . Chest pain, unspecified   . Esophageal reflux   . Family history of ischemic heart disease   . Other and unspecified hyperlipidemia   . Palpitations   . Ulcerative colitis   . Unspecified essential hypertension     Past Surgical History:  Procedure Laterality Date  . ELBOW SURGERY     left  . KNEE ARTHROSCOPY     rihgt  . VASECTOMY      Current Medications: Current Meds  Medication Sig  . amLODipine-benazepril (LOTREL) 10-20 MG capsule Take 1 capsule by mouth daily.  Marland Kitchen ELIQUIS 5 MG TABS tablet TAKE 1 TABLET(5 MG) BY MOUTH TWICE DAILY  . LORazepam (ATIVAN) 0.5 MG tablet Take 0.5 mg by mouth at bedtime as needed.  . metoprolol succinate (TOPROL XL) 25 MG 24 hr tablet Take 3 tablets (75 mg total) by mouth daily.  Marland Kitchen omeprazole (PRILOSEC) 40 MG capsule TAKE 1 CAPSULE BY MOUTH EVERY DAY  . pravastatin (PRAVACHOL) 40 MG tablet Take 1 tablet (40 mg total) by mouth daily.  Marland Kitchen triamcinolone cream (KENALOG) 0.1 % Apply topically 2 (two) times daily as needed.     Allergies:   Codeine   Social History   Socioeconomic History  . Marital status: Single    Spouse name: Not on file  . Number of children: Not on file  . Years of education: Not on file  . Highest education level: Not on file  Occupational History  . Not on file  Tobacco Use  . Smoking status: Former Smoker    Years: 25.00    Types: Cigarettes, Cigars  . Smokeless tobacco: Never Used  Substance and Sexual Activity  . Alcohol use: Yes    Alcohol/week: 0.0 standard drinks  . Drug use: No  . Sexual activity: Yes  Other Topics Concern  . Not on file  Social History Narrative   Married. Education: [employer]. Exercise: Weekly.   Social Determinants of Health   Financial Resource Strain: Not on file  Food Insecurity: Not on file  Transportation Needs: Not on file  Physical Activity: Not on file  Stress: Not on  file  Social Connections: Not on file     Family History: The patient's family history includes Coronary artery disease in an other family member; Stroke in his brother.  ROS:   Please see the history of present illness.     EKGs/Labs/Other Studies Reviewed:    The following studies were reviewed today:  Echocardiogram 06/09/2019: Impressions: 1. Left ventricular ejection fraction, by estimation, is 60 to 65%. The  left ventricle has normal function. The left ventricle has no regional  wall motion abnormalities. Left ventricular diastolic parameters were  normal.  2. Right ventricular systolic function is normal. The right ventricular  size is normal. There is normal pulmonary artery systolic pressure.  3. The mitral valve is normal in structure. Trivial mitral valve  regurgitation. No evidence of mitral stenosis.  4. The aortic valve is tricuspid. Aortic valve regurgitation is trivial.  No aortic stenosis is present.  5. Aortic dilatation noted. There is mild dilatation of the ascending  aorta measuring 40 mm.  6. The inferior vena cava is normal in size with greater than 50%  respiratory variability, suggesting right atrial pressure of 3 mmHg.  _______________  Monitor 05/2019: Sinus bradycardia, normal sinus rhythm, sinus tachycardia, occasional PAC, brief PAT, rare PVC, couplet and 4 beats nonsustained ventricular tachycardia.  EKG:  EKG ordered today. EKG personally reviewed and demonstrates normal sinus rhythm, rate 69 bpm, with PACs. Abnormal T wave in lead III but unchanged from prior tracings. Normal PR and QRS interval. Normal axis. QTc 445 ms.   Recent Labs: 06/01/2019: TSH 1.570 10/03/2019: ALT 15; Magnesium 1.9 12/09/2019: BUN 20; Creatinine, Ser 0.97; Hemoglobin 12.1; Platelets 290; Potassium 4.1; Sodium 136  Recent Lipid Panel    Component Value Date/Time   CHOL 206 (H) 10/03/2019 1420   TRIG 72 10/03/2019 1420   HDL 110 10/03/2019 1420   CHOLHDL 1.9  10/03/2019 1420   CHOLHDL 2.9 12/28/2015 0931   VLDL 21 12/28/2015 0931   LDLCALC 83 10/03/2019 1420    Physical Exam:    Vital Signs: BP 120/76   Pulse 69   Ht 6' 1"  (1.854 m)   Wt 213 lb (96.6 kg)   SpO2 96%   BMI 28.10 kg/m     Wt Readings from Last 3 Encounters:  04/04/20 213 lb (96.6 kg)  12/09/19 206 lb 12.8 oz (93.8 kg)  10/03/19 207 lb 3.2 oz (94 kg)     General: 75 y.o. male in no acute distress. HEENT: Normocephalic and atraumatic. Sclera clear. EOMs intact. Neck: Supple. No carotid bruits. No JVD. Heart: RRR. Distinct S1 and S2. No murmurs, gallops, or rubs. Radial pulses 2+ and equal bilaterally. Lungs: No increased work of breathing. Clear to ausculation bilaterally. No wheezes, rhonchi, or rales.  Abdomen: Soft, non-distended, and non-tender to palpation.  Extremities: Mild lower extremity edema mostly around ankles (left ankle slightly  larger than right). Skin: Warm and dry. Neuro: Alert and oriented x3. No focal deficits. Psych: Normal affect. Responds appropriately.  Assessment:    1. Paroxysmal atrial fibrillation (HCC)   2. Primary hypertension   3. Hyperlipidemia, unspecified hyperlipidemia type   4. Lower extremity edema     Plan:    Paroxysmal Atrial Fibrillation - Maintaining sinus rhythm.  - Continue Toprol-XL 66m daily. - Continue Eliquis 518mtwice daily.   Hypertension - BP well controlled. - Continue current medications: Amlodipine-Benazepril 10-2031maily and Toprol-XL 21m21mily.  Hyperlipidemia - Lipid panel in 09/2019:Total Cholesterol 206, Triglycerides 72, HDL 110, LDL 83.  - Continue Pravastatin 40mg72mly. - Labs followed by PCP.  Lower Extremity Edema - Patient has mild bilateral lower extremity edema mostly around his ankles which started after he was started on Prednisone for colitis flair. He was told Amlodipine may also be contributing to this which is true. We discuss possible stopping his Amlodipine but continuing  Benazepril and then adding HCTZ 25mg 72my for BP. However, BP has been well controlled for years on current medications and he is about to be done with his Prednisone so patient would like to hold off for now. He will call us andKoreaet us knoKoreaif he wants to make this change.  - Recommended compression stockings, elevating legs, and low sodium diet to help with edema.   Disposition: Follow up in 1 year with Dr. CrenshStanford Breeddication Adjustments/Labs and Tests Ordered: Current medicines are reviewed at length with the patient today.  Concerns regarding medicines are outlined above.  No orders of the defined types were placed in this encounter.  No orders of the defined types were placed in this encounter.   Patient Instructions  Medication Instructions:  No Changes *If you need a refill on your cardiac medications before your next appointment, please call your pharmacy*   Lab Work: No Labs If you have labs (blood work) drawn today and your tests are completely normal, you will receive your results only by: . MyChMarland Kitchenrt Message (if you have MyChart) OR . A paper copy in the mail If you have any lab test that is abnormal or we need to change your treatment, we will call you to review the results.   Testing/Procedures: No Testing   Follow-Up: [facility].  As part of our continuing mission to provide you with exceptional heart care, we have created designated Provider Care Teams.  These Care Teams include your primary Cardiologist (physician) and Advanced Practice Providers (APPs -  Physician Assistants and Nurse Practitioners) who all work together to provide you with the care you need, when you need it.  We recommend signing up for the patient portal called "MyChart".  Sign up information is provided on this After Visit Summary.  MyChart is used to connect with patients for Virtual Visits (Telemedicine).  Patients are able to view  lab/test results, encounter notes, upcoming appointments, etc.  Non-urgent messages can be sent to your provider as well.   To learn more about what you can do with MyChart, go to [email or web address] next appointment:   1 year(s)  The format for your next appointment:   In Person  Provider:   Brian Kirk Ruths    Signed, CallieDarreld Mclean  04/04/2020 4:37 PM    [facility]

## 2020-04-04 ENCOUNTER — Encounter: Payer: BC Managed Care – PPO | Admitting: Family Medicine

## 2020-04-04 ENCOUNTER — Ambulatory Visit (INDEPENDENT_AMBULATORY_CARE_PROVIDER_SITE_OTHER): Payer: BC Managed Care – PPO | Admitting: Student

## 2020-04-04 ENCOUNTER — Encounter: Payer: Self-pay | Admitting: Student

## 2020-04-04 ENCOUNTER — Other Ambulatory Visit: Payer: Self-pay

## 2020-04-04 VITALS — BP 120/76 | HR 69 | Ht 73.0 in | Wt 213.0 lb

## 2020-04-04 DIAGNOSIS — E785 Hyperlipidemia, unspecified: Secondary | ICD-10-CM | POA: Diagnosis not present

## 2020-04-04 DIAGNOSIS — I480 Paroxysmal atrial fibrillation: Secondary | ICD-10-CM

## 2020-04-04 DIAGNOSIS — I10 Essential (primary) hypertension: Secondary | ICD-10-CM

## 2020-04-04 DIAGNOSIS — R600 Localized edema: Secondary | ICD-10-CM

## 2020-04-04 NOTE — Patient Instructions (Signed)
Medication Instructions:  No Changes *If you need a refill on your cardiac medications before your next appointment, please call your pharmacy*   Lab Work: No Labs If you have labs (blood work) drawn today and your tests are completely normal, you will receive your results only by: Marland Kitchen MyChart Message (if you have MyChart) OR . A paper copy in the mail If you have any lab test that is abnormal or we need to change your treatment, we will call you to review the results.   Testing/Procedures: No Testing   Follow-Up: At [facility], you and your health needs are our priority.  As part of our continuing mission to provide you with exceptional heart care, we have created designated Provider Care Teams.  These Care Teams include your primary Cardiologist (physician) and Advanced Practice Providers (APPs -  Physician Assistants and Nurse Practitioners) who all work together to provide you with the care you need, when you need it.  We recommend signing up for the patient portal called "MyChart".  Sign up information is provided on this After Visit Summary.  MyChart is used to connect with patients for Virtual Visits (Telemedicine).  Patients are able to view lab/test results, encounter notes, upcoming appointments, etc.  Non-urgent messages can be sent to your provider as well.   To learn more about what you can do with MyChart, go to [email or web address].    Your next appointment:   1 year(s)  The format for your next appointment:   In Person  Provider:   Kirk Ruths, MD

## 2020-04-12 DIAGNOSIS — K5190 Ulcerative colitis, unspecified, without complications: Secondary | ICD-10-CM | POA: Diagnosis not present

## 2020-04-16 NOTE — Addendum Note (Signed)
Addended by: Wonda Horner on: 04/16/2020 01:58 PM   Modules accepted: Orders

## 2020-04-24 ENCOUNTER — Telehealth: Payer: Self-pay | Admitting: Family Medicine

## 2020-04-24 NOTE — Telephone Encounter (Signed)
Pt needs an appointment for new medication and to review swelling concerns

## 2020-04-24 NOTE — Telephone Encounter (Signed)
Pt called to speak with provider about him sending  the different BP medication that was discussed that doesn't cause swelling/ please advise   [place], [place] [facility] [place] Phone:  [phone]  Fax:  [phone]

## 2020-04-30 NOTE — Telephone Encounter (Signed)
Left message on voicemail to schedule appointment for med refill and to address swelling.

## 2020-05-02 DIAGNOSIS — H40053 Ocular hypertension, bilateral: Secondary | ICD-10-CM | POA: Diagnosis not present

## 2020-05-17 ENCOUNTER — Other Ambulatory Visit: Payer: Self-pay | Admitting: Family Medicine

## 2020-05-17 ENCOUNTER — Encounter: Payer: Self-pay | Admitting: Family Medicine

## 2020-05-17 ENCOUNTER — Ambulatory Visit (INDEPENDENT_AMBULATORY_CARE_PROVIDER_SITE_OTHER): Payer: BC Managed Care – PPO | Admitting: Family Medicine

## 2020-05-17 ENCOUNTER — Other Ambulatory Visit: Payer: Self-pay

## 2020-05-17 VITALS — BP 128/77 | HR 62 | Temp 97.8°F | Ht 73.0 in | Wt 218.0 lb

## 2020-05-17 DIAGNOSIS — I10 Essential (primary) hypertension: Secondary | ICD-10-CM

## 2020-05-17 DIAGNOSIS — E785 Hyperlipidemia, unspecified: Secondary | ICD-10-CM | POA: Diagnosis not present

## 2020-05-17 DIAGNOSIS — R600 Localized edema: Secondary | ICD-10-CM | POA: Diagnosis not present

## 2020-05-17 MED ORDER — BENAZEPRIL HCL 20 MG PO TABS: 20.0000 mg | ORAL_TABLET | Freq: Every day | ORAL | 1 refills | Status: DC

## 2020-05-17 MED ORDER — HYDROCHLOROTHIAZIDE 12.5 MG PO CAPS: 12.5000 mg | ORAL_CAPSULE | Freq: Every day | ORAL | 1 refills | Status: DC

## 2020-05-17 NOTE — Patient Instructions (Addendum)
[facility] Address: [address], [place], [place] [address] Phone: [phone]  Combination benazepril/amlodipine.  Start benazepril 20 mg by itself and hydrochlorothiazide 1 pill/day.  No change in other medications for now.  See information below on swelling.  Can use compression stockings over-the-counter which may also help.  Coming off prednisone may also help but I suspect amlodipine is contributing.  Recheck with me in 6 weeks for repeat labs.  If any new side effects of hydrochlorothiazide, or new symptoms be seen sooner. Let me know if there are questions.  Thank you for coming in today.   Edema  Edema is an abnormal buildup of fluids in the body tissues and under the skin. Swelling of the legs, feet, and ankles is a common symptom that becomes more likely as you get older. Swelling is also common in looser tissues, like around the eyes. When the affected area is squeezed, the fluid may move out of that spot and leave a dent for a few moments. This dent is called pitting edema. There are many possible causes of edema. Eating too much salt (sodium) and being on your feet or sitting for a long time can cause edema in your legs, feet, and ankles. Hot weather may make edema worse. Common causes of edema include:  Heart failure.  Liver or kidney disease.  Weak leg blood vessels.  Cancer.  An injury.  Pregnancy.  Medicines.  Being obese.  Low protein levels in the blood. Edema is usually painless. Your skin may look swollen or shiny. Follow these instructions at home:  Keep the affected body part raised (elevated) above the level of your heart when you are sitting or lying down.  Do not sit still or stand for long periods of time.  Do not wear tight clothing. Do not wear garters on your upper legs.  Exercise your legs to get your circulation going. This helps to move the fluid back into your blood vessels, and it may help the swelling go down.  Wear  elastic bandages or support stockings to reduce swelling as told by your health care provider.  Eat a low-salt (low-sodium) diet to reduce fluid as told by your health care provider.  Depending on the cause of your swelling, you may need to limit how much fluid you drink (fluid restriction).  Take over-the-counter and prescription medicines only as told by your health care provider. Contact a health care provider if:  Your edema does not get better with treatment.  You have heart, liver, or kidney disease and have symptoms of edema.  You have sudden and unexplained weight gain. Get help right away if:  You develop shortness of breath or chest pain.  You cannot breathe when you lie down.  You develop pain, redness, or warmth in the swollen areas.  You have heart, liver, or kidney disease and suddenly get edema.  You have a fever and your symptoms suddenly get worse. Summary  Edema is an abnormal buildup of fluids in the body tissues and under the skin.  Eating too much salt (sodium) and being on your feet or sitting for a long time can cause edema in your legs, feet, and ankles.  Keep the affected body part raised (elevated) above the level of your heart when you are sitting or lying down. This information is not intended to replace advice given to you by your health care provider. Make sure you discuss any questions you have with your health care provider.  Document Revised: 06/30/2018 Document Reviewed: 03/15/2016 Elsevier Patient Education  2021 [employer].    If you have lab work done today you will be contacted with your lab results within the next 2 weeks.  If you have not heard from [place] then please contact us. The fastest way to get your results is to register for My Chart.   IF you received an x-ray today, you will receive an invoice from [facility]. Please contact [facility] at [phone] with questions or concerns regarding your invoice.   IF  you received labwork today, you will receive an invoice from [place]. Please contact [facility] at [phone] with questions or concerns regarding your invoice.   Our billing staff will not be able to assist you with questions regarding bills from these companies.  You will be contacted with the lab results as soon as they are available. The fastest way to get your results is to activate your My Chart account. Instructions are located on the last page of this paperwork. If you have not heard from [place] regarding the results in 2 weeks, please contact this office.

## 2020-05-17 NOTE — Progress Notes (Signed)
Subjective:  Patient ID: Joseph Smith, male    DOB: 02/26/1945  Age: 75 y.o. MRN: [id number]  CC:  Chief Complaint  Patient presents with  . Follow-up    On medications. Pt reports during last OV. He discussed some swelling he was having due to his medication. Pt reports a few of his medications case him to swell up and was told some change to his current BP medications might help.    HPI Joseph Smith presents for  Hypertension: Cards: Dr. Stanford Breed, history of A. fib, on Eliquis for anticoagulation, Toprol for rate control.  Some cramps in his feet at times were discussed, calves at night, has to stand up and stretch at times. Previously amlodipine benazepril for hypertension. Saw cardiology February 9.  Pedal edema discussed, noted after prednisone for colitis flare.  Discussed possibly stopping amlodipine but as blood pressure has been controlled decided to remain on that medication instead of adding HCTZ.  Discussed compression stockings, leg elevation and low-sodium diet. Still some swelling in legs - was there before prednisone.  Not using compression stockings.  No added salt to foods.  Coming off prednisone this week - on for past month or more - will be coming off this week and to start humira.  Cataract surgery in am.   BP Readings from Last 3 Encounters:  05/17/20 128/77  04/04/20 120/76  12/09/19 134/81   Lab Results  Component Value Date   CREATININE 0.97 12/09/2019   Hyperlipidemia: Pravastatin 40 mg daily.  Lab Results  Component Value Date   CHOL 206 (H) 10/03/2019   HDL 110 10/03/2019   LDLCALC 83 10/03/2019   TRIG 72 10/03/2019   CHOLHDL 1.9 10/03/2019   Lab Results  Component Value Date   ALT 15 10/03/2019   AST 15 10/03/2019   ALKPHOS 46 (L) 10/03/2019   BILITOT 0.4 10/03/2019      History Patient Active Problem List   Diagnosis Date Noted  . Ulcerative colitis ([place]) 05/29/2016  . Bruit 12/03/2012  . Hyperglycemia 02/05/2011  .  Impotence due to erectile dysfunction 02/05/2011  . ATRIAL FIBRILLATION 05/10/2010  . PALPITATIONS 04/23/2010  . CHEST PAIN-UNSPECIFIED 04/17/2009  . Hyperlipidemia 04/11/2009  . Essential hypertension 04/11/2009  . GERD 04/11/2009   Past Medical History:  Diagnosis Date  . Atrial fibrillation ([place])   . Chest pain, unspecified   . Esophageal reflux   . Family history of ischemic heart disease   . Other and unspecified hyperlipidemia   . Palpitations   . Ulcerative colitis   . Unspecified essential hypertension    Past Surgical History:  Procedure Laterality Date  . ELBOW SURGERY     left  . KNEE ARTHROSCOPY     rihgt  . VASECTOMY     Allergies  Allergen Reactions  . Codeine Itching    REACTION: Reaction not known   Prior to Admission medications   Medication Sig Start Date End Date Taking? Authorizing Provider  amLODipine-benazepril (LOTREL) 10-20 MG capsule Take 1 capsule by mouth daily. 10/03/19  Yes Wendie Agreste, MD  ELIQUIS 5 MG TABS tablet TAKE 1 TABLET(5 MG) BY MOUTH TWICE DAILY 09/22/19  Yes Lelon Perla, MD  HUMIRA PEN 40 MG/0.4ML PNKT SMARTSIG:40 Milligram(s) SUB-Q Every 2 Weeks 05/16/20  Yes [provider]  LORazepam (ATIVAN) 0.5 MG tablet Take 0.5 mg by mouth at bedtime as needed. 02/23/20  Yes [provider]  metoprolol succinate (TOPROL XL) 25 MG 24 hr tablet Take  3 tablets (75 mg total) by mouth daily. 06/01/19  Yes Lendon Colonel, NP  omeprazole (PRILOSEC) 40 MG capsule TAKE 1 CAPSULE BY MOUTH EVERY DAY 01/22/20  Yes Wendie Agreste, MD  pravastatin (PRAVACHOL) 40 MG tablet Take 1 tablet (40 mg total) by mouth daily. 10/10/19  Yes Wendie Agreste, MD  triamcinolone cream (KENALOG) 0.1 % Apply topically 2 (two) times daily as needed. 04/04/19  Yes Wendie Agreste, MD   Social History   Socioeconomic History  . Marital status: Single    Spouse name: Not on file  . Number of children: Not on file  . Years of education: Not  on file  . Highest education level: Not on file  Occupational History  . Not on file  Tobacco Use  . Smoking status: Former Smoker    Years: 25.00    Types: Cigarettes, Cigars  . Smokeless tobacco: Never Used  Substance and Sexual Activity  . Alcohol use: Yes    Alcohol/week: 0.0 standard drinks  . Drug use: No  . Sexual activity: Yes  Other Topics Concern  . Not on file  Social History Narrative   Married. Education: [employer]. Exercise: Weekly.   Social Determinants of Health   Financial Resource Strain: Not on file  Food Insecurity: Not on file  Transportation Needs: Not on file  Physical Activity: Not on file  Stress: Not on file  Social Connections: Not on file  Intimate Partner Violence: Not on file    Review of Systems  Constitutional: Negative for fatigue and unexpected weight change.  Eyes: Negative for visual disturbance.  Respiratory: Negative for cough, chest tightness and shortness of breath.   Cardiovascular: Negative for chest pain, palpitations and leg swelling.  Gastrointestinal: Negative for abdominal pain and blood in stool.  Neurological: Negative for dizziness, light-headedness and headaches.     Objective:   Vitals:   05/17/20 1558  BP: 128/77  Pulse: 62  Temp: 97.8 F (36.6 C)  TempSrc: Temporal  SpO2: 98%  Weight: 218 lb (98.9 kg)  Height: 6' 1"  (1.854 m)     Physical Exam Vitals reviewed.  Constitutional:      Appearance: He is well-developed.  HENT:     Head: Normocephalic and atraumatic.  Eyes:     Pupils: Pupils are equal, round, and reactive to light.  Neck:     Vascular: No carotid bruit or JVD.     Comments: No JVD Cardiovascular:     Rate and Rhythm: Normal rate and regular rhythm.     Heart sounds: Normal heart sounds. No murmur heard.   Pulmonary:     Effort: Pulmonary effort is normal.     Breath sounds: Normal breath sounds. No rales.  Abdominal:     Tenderness: There is no abdominal tenderness.   Musculoskeletal:     Right lower leg: Edema (1-2 + pitting to mid tibia bilat, no wounds. ) present.     Left lower leg: Edema present.  Skin:    General: Skin is warm and dry.  Neurological:     Mental Status: He is alert and oriented to person, place, and time.       Assessment & Plan:  Joseph Smith is a 75 y.o. male . Essential hypertension - Plan: benazepril (LOTENSIN) 20 MG tablet, hydrochlorothiazide (MICROZIDE) 12.5 MG capsule  Pedal edema  Hyperlipidemia, unspecified hyperlipidemia type - Plan: Lipid panel, Comprehensive metabolic panel  Pedal edema may be multifactorial, with prednisone but did have  some previously, likely related to amlodipine.  We will stop amlodipine for now, try hydrochlorothiazide, separate benazepril prescription, continue metoprolol and other prescriptions same dose for now.  Handout given on pedal edema as well as compression stocking options.  Check labs, and then repeat labs in 6 weeks with office visit with RTC precautions given.   Tolerating statin, continue same with labs pending above.       Meds ordered this encounter  Medications  . benazepril (LOTENSIN) 20 MG tablet    Sig: Take 1 tablet (20 mg total) by mouth daily.    Dispense:  90 tablet    Refill:  1  . hydrochlorothiazide (MICROZIDE) 12.5 MG capsule    Sig: Take 1 capsule (12.5 mg total) by mouth daily.    Dispense:  30 capsule    Refill:  1   Patient Instructions   [facility] Address: [address] [place], [place], [place] [address] Phone: [phone]  Combination benazepril/amlodipine.  Start benazepril 20 mg by itself and hydrochlorothiazide 1 pill/day.  No change in other medications for now.  See information below on swelling.  Can use compression stockings over-the-counter which may also help.  Coming off prednisone may also help but I suspect amlodipine is contributing.  Recheck with me in 6 weeks for repeat labs.  If any new side effects of  hydrochlorothiazide, or new symptoms be seen sooner. Let me know if there are questions.  Thank you for coming in today.   Edema  Edema is an abnormal buildup of fluids in the body tissues and under the skin. Swelling of the legs, feet, and ankles is a common symptom that becomes more likely as you get older. Swelling is also common in looser tissues, like around the eyes. When the affected area is squeezed, the fluid may move out of that spot and leave a dent for a few moments. This dent is called pitting edema. There are many possible causes of edema. Eating too much salt (sodium) and being on your feet or sitting for a long time can cause edema in your legs, feet, and ankles. Hot weather may make edema worse. Common causes of edema include:  Heart failure.  Liver or kidney disease.  Weak leg blood vessels.  Cancer.  An injury.  Pregnancy.  Medicines.  Being obese.  Low protein levels in the blood. Edema is usually painless. Your skin may look swollen or shiny. Follow these instructions at home:  Keep the affected body part raised (elevated) above the level of your heart when you are sitting or lying down.  Do not sit still or stand for long periods of time.  Do not wear tight clothing. Do not wear garters on your upper legs.  Exercise your legs to get your circulation going. This helps to move the fluid back into your blood vessels, and it may help the swelling go down.  Wear elastic bandages or support stockings to reduce swelling as told by your health care provider.  Eat a low-salt (low-sodium) diet to reduce fluid as told by your health care provider.  Depending on the cause of your swelling, you may need to limit how much fluid you drink (fluid restriction).  Take over-the-counter and prescription medicines only as told by your health care provider. Contact a health care provider if:  Your edema does not get better with treatment.  You have heart, liver, or  kidney disease and have symptoms of edema.  You have sudden and unexplained  weight gain. Get help right away if:  You develop shortness of breath or chest pain.  You cannot breathe when you lie down.  You develop pain, redness, or warmth in the swollen areas.  You have heart, liver, or kidney disease and suddenly get edema.  You have a fever and your symptoms suddenly get worse. Summary  Edema is an abnormal buildup of fluids in the body tissues and under the skin.  Eating too much salt (sodium) and being on your feet or sitting for a long time can cause edema in your legs, feet, and ankles.  Keep the affected body part raised (elevated) above the level of your heart when you are sitting or lying down. This information is not intended to replace advice given to you by your health care provider. Make sure you discuss any questions you have with your health care provider. Document Revised: 06/30/2018 Document Reviewed: 03/15/2016 Elsevier Patient Education  2021 [employer].    If you have lab work done today you will be contacted with your lab results within the next 2 weeks.  If you have not heard from [place] then please contact us. The fastest way to get your results is to register for My Chart.   IF you received an x-ray today, you will receive an invoice from [facility]. Please contact [facility] at [phone] with questions or concerns regarding your invoice.   IF you received labwork today, you will receive an invoice from [place]. Please contact [facility] at [phone] with questions or concerns regarding your invoice.   Our billing staff will not be able to assist you with questions regarding bills from these companies.  You will be contacted with the lab results as soon as they are available. The fastest way to get your results is to activate your My Chart account. Instructions are located on the last page of this paperwork. If you have not heard from  [place] regarding the results in 2 weeks, please contact this office.         Signed, Merri Ray, MD [facility]

## 2020-05-18 ENCOUNTER — Other Ambulatory Visit: Payer: Self-pay | Admitting: Family Medicine

## 2020-05-18 DIAGNOSIS — H2513 Age-related nuclear cataract, bilateral: Secondary | ICD-10-CM | POA: Diagnosis not present

## 2020-05-18 DIAGNOSIS — I10 Essential (primary) hypertension: Secondary | ICD-10-CM

## 2020-05-21 ENCOUNTER — Other Ambulatory Visit: Payer: Self-pay | Admitting: Adult Health

## 2020-05-25 DIAGNOSIS — E785 Hyperlipidemia, unspecified: Secondary | ICD-10-CM | POA: Diagnosis not present

## 2020-05-25 LAB — LIPID PANEL
Chol/HDL Ratio: 2.2 ratio (ref 0.0–5.0)
Cholesterol, Total: 195 mg/dL (ref 100–199)
HDL: 89 mg/dL (ref >39)
LDL Chol Calc (NIH): 93 mg/dL (ref 0–99)
Triglycerides: 70 mg/dL (ref 0–149)
VLDL Cholesterol Cal: 13 mg/dL (ref 5–40)

## 2020-05-25 LAB — COMPREHENSIVE METABOLIC PANEL
ALT: 14 IU/L (ref 0–44)
AST: 16 IU/L (ref 0–40)
Albumin/Globulin Ratio: 2 (ref 1.2–2.2)
Albumin: 4.2 g/dL (ref 3.7–4.7)
Alkaline Phosphatase: 37 IU/L — ABNORMAL LOW (ref 44–121)
BUN/Creatinine Ratio: 16 (ref 10–24)
BUN: 16 mg/dL (ref 8–27)
Bilirubin Total: 0.4 mg/dL (ref 0.0–1.2)
CO2: 20 mmol/L (ref 20–29)
Calcium: 8.7 mg/dL (ref 8.6–10.2)
Chloride: 90 mmol/L — ABNORMAL LOW (ref 96–106)
Creatinine, Ser: 0.99 mg/dL (ref 0.76–1.27)
Globulin, Total: 2.1 g/dL (ref 1.5–4.5)
Glucose: 91 mg/dL (ref 65–99)
Potassium: 4.6 mmol/L (ref 3.5–5.2)
Sodium: 126 mmol/L — ABNORMAL LOW (ref 134–144)
Total Protein: 6.3 g/dL (ref 6.0–8.5)
eGFR: 80 mL/min/{1.73_m2} (ref >59)

## 2020-06-17 ENCOUNTER — Other Ambulatory Visit: Payer: Self-pay | Admitting: Cardiology

## 2020-06-17 DIAGNOSIS — I480 Paroxysmal atrial fibrillation: Secondary | ICD-10-CM

## 2020-06-18 NOTE — Telephone Encounter (Signed)
71m 98.9kg, scr 0.99 05/25/20, lovw/goodrich 04/04/20

## 2020-06-20 ENCOUNTER — Encounter: Payer: Self-pay | Admitting: Family Medicine

## 2020-06-20 ENCOUNTER — Other Ambulatory Visit: Payer: Self-pay

## 2020-06-20 ENCOUNTER — Ambulatory Visit (INDEPENDENT_AMBULATORY_CARE_PROVIDER_SITE_OTHER): Payer: BC Managed Care – PPO | Admitting: Family Medicine

## 2020-06-20 VITALS — BP 126/70 | HR 63 | Temp 98.3°F | Resp 16 | Ht 73.0 in | Wt 219.8 lb

## 2020-06-20 DIAGNOSIS — I10 Essential (primary) hypertension: Secondary | ICD-10-CM | POA: Diagnosis not present

## 2020-06-20 DIAGNOSIS — E871 Hypo-osmolality and hyponatremia: Secondary | ICD-10-CM

## 2020-06-20 DIAGNOSIS — M25561 Pain in right knee: Secondary | ICD-10-CM

## 2020-06-20 DIAGNOSIS — Z0000 Encounter for general adult medical examination without abnormal findings: Secondary | ICD-10-CM | POA: Diagnosis not present

## 2020-06-20 DIAGNOSIS — R600 Localized edema: Secondary | ICD-10-CM

## 2020-06-20 NOTE — Patient Instructions (Addendum)
Needed you can use the over-the-counter brace, and can also try some Voltaren gel temporarily, but if that is not improving I would recommend follow-up with Dr. Noemi Chapel.  He can perform x-rays or other imaging at his office if needed.  I did not order any imaging today.   No med changes, may want to try compression stockings to help with swelling.  Follow-up in 6 months unless there are concerns sooner.  Take care.    Preventive Care 74 Years and Older, Male Preventive care refers to lifestyle choices and visits with your health care provider that can promote health and wellness. This includes:  A yearly physical exam. This is also called an annual wellness visit.  Regular dental and eye exams.  Immunizations.  Screening for certain conditions.  Healthy lifestyle choices, such as: ? Eating a healthy diet. ? Getting regular exercise. ? Not using drugs or products that contain nicotine and tobacco. ? Limiting alcohol use. What can I expect for my preventive care visit? Physical exam Your health care provider will check your:  Height and weight. These may be used to calculate your BMI (body mass index). BMI is a measurement that tells if you are at a healthy weight.  Heart rate and blood pressure.  Body temperature.  Skin for abnormal spots. Counseling Your health care provider may ask you questions about your:  Past medical problems.  Family's medical history.  Alcohol, tobacco, and drug use.  Emotional well-being.  Home life and relationship well-being.  Sexual activity.  Diet, exercise, and sleep habits.  History of falls.  Memory and ability to understand (cognition).  Work and work Statistician.  Access to firearms. What immunizations do I need? Vaccines are usually given at various ages, according to a schedule. Your health care provider will recommend vaccines for you based on your age, medical history, and lifestyle or other factors, such as travel or where  you work.   What tests do I need? Blood tests  Lipid and cholesterol levels. These may be checked every 5 years, or more often depending on your overall health.  Hepatitis C test.  Hepatitis B test. Screening  Lung cancer screening. You may have this screening every year starting at age 31 if you have a 30-pack-year history of smoking and currently smoke or have quit within the past 15 years.  Colorectal cancer screening. ? All adults should have this screening starting at age 40 and continuing until age 77. ? Your health care provider may recommend screening at age 59 if you are at increased risk. ? You will have tests every 1-10 years, depending on your results and the type of screening test.  Prostate cancer screening. Recommendations will vary depending on your family history and other risks.  Genital exam to check for testicular cancer or hernias.  Diabetes screening. ? This is done by checking your blood sugar (glucose) after you have not eaten for a while (fasting). ? You may have this done every 1-3 years.  Abdominal aortic aneurysm (AAA) screening. You may need this if you are a current or former smoker.  STD (sexually transmitted disease) testing, if you are at risk. Follow these instructions at home: Eating and drinking  Eat a diet that includes fresh fruits and vegetables, whole grains, lean protein, and low-fat dairy products. Limit your intake of foods with high amounts of sugar, saturated fats, and salt.  Take vitamin and mineral supplements as recommended by your health care provider.  Do not drink  alcohol if your health care provider tells you not to drink.  If you drink alcohol: ? Limit how much you have to 0-2 drinks a day. ? Be aware of how much alcohol is in your drink. In the [place] one drink equals one 12 oz bottle of beer (355 mL), one 5 oz glass of wine (148 mL), or one 1 oz glass of hard liquor (44 mL).   Lifestyle  Take daily care of your teeth  and gums. Brush your teeth every morning and night with fluoride toothpaste. Floss one time each day.  Stay active. Exercise for at least 30 minutes 5 or more days each week.  Do not use any products that contain nicotine or tobacco, such as cigarettes, e-cigarettes, and chewing tobacco. If you need help quitting, ask your health care provider.  Do not use drugs.  If you are sexually active, practice safe sex. Use a condom or other form of protection to prevent STIs (sexually transmitted infections).  Talk with your health care provider about taking a low-dose aspirin or statin.  Find healthy ways to cope with stress, such as: ? Meditation, yoga, or listening to music. ? Journaling. ? Talking to a trusted person. ? Spending time with friends and family. Safety  Always wear your seat belt while driving or riding in a vehicle.  Do not drive: ? If you have been drinking alcohol. Do not ride with someone who has been drinking. ? When you are tired or distracted. ? While texting.  Wear a helmet and other protective equipment during sports activities.  If you have firearms in your house, make sure you follow all gun safety procedures. What's next?  Visit your health care provider once a year for an annual wellness visit.  Ask your health care provider how often you should have your eyes and teeth checked.  Stay up to date on all vaccines. This information is not intended to replace advice given to you by your health care provider. Make sure you discuss any questions you have with your health care provider. Document Revised: 11/09/2018 Document Reviewed: 02/04/2018 Elsevier Patient Education  2021 [employer].

## 2020-06-20 NOTE — Progress Notes (Signed)
Subjective:  Patient ID: Joseph Smith, male    DOB: 11/14/1945  Age: 75 y.o. MRN: [id number]  CC:  Chief Complaint  Patient presents with  . Annual Exam    Pt doing well, no concerns, no refills at this time     HPI Joseph Smith presents for  Annual physical exam  Care team Primary care provider, me Cardiology, Dr. Stanford Breed [facility], prior Dr. Therisa Doyne at [facility]   Hyponatremia: Sodium 126 on 05/25/2020 labs.  Cardiac Cardiologist Dr. Stanford Breed, history of A. fib, on Eliquis for anticoagulation, Toprol for rate control.  Some pedal edema discussed at his March 24th visit.  Amlodipine discontinued at last visit, started HCTZ in its place.  Hyponatremia noted as above, plan for repeat testing today.  Compression stockings also option. No new side effects in hctz.  Still some leg swelling - better. Not wearing compression stockings yet - plans to purchase.   R knee pain: Pain on inside of knee with pivoting at times. Swells at times. Past few months. NKI. Had been doing some moving few months ago. Sore since then.  Not limiting activity.  Tx: aspercreme with lidocaine. Works fairly well. Quick pivot causes sharp pain.   Has otc brace.  Ortho - Dr. Noemi Chapel. Plans to follow up there.   BP Readings from Last 3 Encounters:  06/20/20 126/70  05/17/20 128/77  04/04/20 120/76   Hyperlipidemia: Pravastatin 40 mg daily.  Stable control on recent labs. Lab Results  Component Value Date   CHOL 195 05/25/2020   HDL 89 05/25/2020   LDLCALC 93 05/25/2020   TRIG 70 05/25/2020   CHOLHDL 2.2 05/25/2020   Lab Results  Component Value Date   ALT 14 05/25/2020   AST 16 05/25/2020   ALKPHOS 37 (L) 05/25/2020   BILITOT 0.4 05/25/2020   Ulcerative colitis Gastroenterology Dr. Therisa Doyne.  Previous prednisone, transitioned to Humira. Colonoscopy in 2018. Plans on repeat soon.   Immunization History  Administered Date(s) Administered  . Fluad Quad(high Dose 65+) 12/09/2019  .  Influenza Split 02/04/2011, 11/30/2011  . Influenza, High Dose Seasonal PF 12/30/2017  . Influenza,inj,Quad PF,6+ Mos 01/12/2014, 01/09/2015, 12/08/2016  . Moderna Sars-Covid-2 Vaccination 04/06/2019  . PFIZER(Purple Top)SARS-COV-2 Vaccination 05/11/2019  . Pneumococcal Conjugate-13 07/13/2014  . Pneumococcal Polysaccharide-23 07/19/2015  . Td 12/21/2007  shingles vaccine: has not had. Declines.    Fall Risk  06/20/2020 05/17/2020 12/09/2019 10/03/2019 04/04/2019  Falls in the past year? 0 0 0 0 0  Comment - - - - -  Number falls in past yr: - - 0 0 0  Injury with Fall? - - 0 0 0  Risk for fall due to : - - No Fall Risks - -  Follow up Falls evaluation completed Falls evaluation completed Falls evaluation completed Falls evaluation completed -   Depression screen [facility] 2/9 06/20/2020 05/17/2020 12/09/2019 10/03/2019 04/04/2019  Decreased Interest 0 0 0 0 0  Down, Depressed, Hopeless 0 0 0 0 0  PHQ - 2 Score 0 0 0 0 0   Functional Status Survey: Is the patient deaf or have difficulty hearing?: Yes Does the patient have difficulty seeing, even when wearing glasses/contacts?: Yes Does the patient have difficulty concentrating, remembering, or making decisions?: No Does the patient have difficulty walking or climbing stairs?: Yes (long standing knee issue) Does the patient have difficulty dressing or bathing?: No Does the patient have difficulty doing errands alone such as visiting a doctor's office or shopping?: No Vision -  has cataracts - surgery may 5 and may 19th.  Hearing - wears hearing aids. Working ok.   6CIT Screen 06/20/2020 04/01/2018  What Year? 0 points 0 points  What month? 0 points 0 points  What time? 0 points 0 points  Count back from 20 0 points 0 points  Months in reverse 0 points 0 points  Repeat phrase 0 points 0 points  Total Score 0 0    Flowsheet Row Office Visit from 06/20/2020 in [place]  AUDIT-C Score 4    no DUI, no  feeling of addiction to alcohol.   Dental: every 6 months. Natural teeth.   Exercise: walking during day and home yardwork, work at home.   Advanced Directives: none - declined info.   History Patient Active Problem List   Diagnosis Date Noted  . Ulcerative colitis ([place]) 05/29/2016  . Bruit 12/03/2012  . Hyperglycemia 02/05/2011  . Impotence due to erectile dysfunction 02/05/2011  . ATRIAL FIBRILLATION 05/10/2010  . PALPITATIONS 04/23/2010  . CHEST PAIN-UNSPECIFIED 04/17/2009  . Hyperlipidemia 04/11/2009  . Essential hypertension 04/11/2009  . GERD 04/11/2009   Past Medical History:  Diagnosis Date  . Atrial fibrillation ([place])   . Chest pain, unspecified   . Esophageal reflux   . Family history of ischemic heart disease   . Other and unspecified hyperlipidemia   . Palpitations   . Ulcerative colitis   . Unspecified essential hypertension    Past Surgical History:  Procedure Laterality Date  . ELBOW SURGERY     left  . KNEE ARTHROSCOPY     rihgt  . VASECTOMY     Allergies  Allergen Reactions  . Codeine Itching    REACTION: Reaction not known   Prior to Admission medications   Medication Sig Start Date End Date Taking? Authorizing Provider  benazepril (LOTENSIN) 20 MG tablet Take 1 tablet (20 mg total) by mouth daily. 05/17/20  Yes Wendie Agreste, MD  ELIQUIS 5 MG TABS tablet TAKE 1 TABLET(5 MG) BY MOUTH TWICE DAILY 06/18/20  Yes Darreld Mclean, PA-C  HUMIRA PEN 40 MG/0.4ML PNKT SMARTSIG:40 Milligram(s) SUB-Q Every 2 Weeks 05/16/20  Yes [provider]  hydrochlorothiazide (MICROZIDE) 12.5 MG capsule TAKE 1 CAPSULE(12.5 MG) BY MOUTH DAILY 05/18/20  Yes Wendie Agreste, MD  LORazepam (ATIVAN) 0.5 MG tablet Take 0.5 mg by mouth at bedtime as needed. 02/23/20  Yes [provider]  metoprolol succinate (TOPROL-XL) 25 MG 24 hr tablet TAKE 3 TABLETS(75 MG) BY MOUTH DAILY 05/21/20  Yes Lelon Perla, MD  omeprazole (PRILOSEC) 40 MG capsule TAKE 1  CAPSULE BY MOUTH EVERY DAY 01/22/20  Yes Wendie Agreste, MD  pravastatin (PRAVACHOL) 40 MG tablet Take 1 tablet (40 mg total) by mouth daily. 10/10/19  Yes Wendie Agreste, MD  triamcinolone cream (KENALOG) 0.1 % Apply topically 2 (two) times daily as needed. 04/04/19  Yes Wendie Agreste, MD   Social History   Socioeconomic History  . Marital status: Single    Spouse name: Not on file  . Number of children: Not on file  . Years of education: Not on file  . Highest education level: Not on file  Occupational History  . Not on file  Tobacco Use  . Smoking status: Former Smoker    Years: 25.00    Types: Cigarettes, Cigars  . Smokeless tobacco: Never Used  Substance and Sexual Activity  . Alcohol use: Yes    Alcohol/week: 0.0 standard drinks  Comment: pt has about 10 drinks or varying type on average throughout one week   . Drug use: No  . Sexual activity: Yes  Other Topics Concern  . Not on file  Social History Narrative   Married. Education: [employer]. Exercise: Weekly.   Social Determinants of Health   Financial Resource Strain: Not on file  Food Insecurity: Not on file  Transportation Needs: Not on file  Physical Activity: Not on file  Stress: Not on file  Social Connections: Not on file  Intimate Partner Violence: Not on file    Review of Systems   Objective:   Vitals:   06/20/20 1519  BP: 126/70  Pulse: 63  Resp: 16  Temp: 98.3 F (36.8 C)  TempSrc: Temporal  SpO2: 99%  Weight: 219 lb 12.8 oz (99.7 kg)  Height: 6' 1"  (1.854 m)     Physical Exam Vitals reviewed.  Constitutional:      Appearance: He is well-developed.  HENT:     Head: Normocephalic and atraumatic.     Right Ear: External ear normal.     Left Ear: External ear normal.  Eyes:     Conjunctiva/sclera: Conjunctivae normal.     Pupils: Pupils are equal, round, and reactive to light.  Neck:     Thyroid: No thyromegaly.  Cardiovascular:     Rate and Rhythm: Normal rate and regular  rhythm.     Heart sounds: Normal heart sounds.  Pulmonary:     Effort: Pulmonary effort is normal. No respiratory distress.     Breath sounds: Normal breath sounds. No wheezing.  Abdominal:     General: There is no distension.     Palpations: Abdomen is soft.     Tenderness: There is no abdominal tenderness.     Hernia: There is no hernia in the left inguinal area or right inguinal area.  Genitourinary:    Prostate: Normal.  Musculoskeletal:        General: No tenderness. Normal range of motion.     Cervical back: Normal range of motion and neck supple.     Right lower leg: Edema (1-2+ with slight pitting bilaterally, lower third.) present.     Left lower leg: Edema present.     Comments: Right knee, possible trace effusion, full range of motion.  Skin intact, no erythema No focal bony tenderness, joint lines nontender.  Does describe area of discomfort at the medial joint line but nontender exam, negative Lachman, varus, valgus testing, and McMurray.  Lymphadenopathy:     Cervical: No cervical adenopathy.  Skin:    General: Skin is warm and dry.  Neurological:     Mental Status: He is alert and oriented to person, place, and time.     Deep Tendon Reflexes: Reflexes are normal and symmetric.  Psychiatric:        Behavior: Behavior normal.      Assessment & Plan:  Joseph Smith is a 75 y.o. male . Annual physical exam - Plan: Basic metabolic panel  - -anticipatory guidance as below in AVS, screening labs above. Health maintenance items as above in HPI discussed/recommended as applicable.   Hyponatremia - Plan: Basic metabolic panel  -Repeat BMP on HCTZ, especially with prior hyponatremia  -Continue same med regimen.  Compression stockings discussed for pedal edema.  Has improved   Hyperlipidemia stable on recent labs.   Continue follow-up with cardiology for A. Fib,  continue follow-up with gastroenterology with ulcerative colitis.  Right knee pain, unspecified  chronicity Reassuring exam.  Possible arthritis versus degenerative meniscal disease.  With minimal impact on ADLs/function, symptomatic care for now, short-term Voltaren topical, over-the-counter brace, but also plans on follow-up with Ortho.  Imaging deferred.  No orders of the defined types were placed in this encounter.  Patient Instructions   Needed you can use the over-the-counter brace, and can also try some Voltaren gel temporarily, but if that is not improving I would recommend follow-up with Dr. Noemi Chapel.  He can perform x-rays or other imaging at his office if needed.  I did not order any imaging today.   No med changes, may want to try compression stockings to help with swelling.  Follow-up in 6 months unless there are concerns sooner.  Take care.    Preventive Care 54 Years and Older, Male Preventive care refers to lifestyle choices and visits with your health care provider that can promote health and wellness. This includes:  A yearly physical exam. This is also called an annual wellness visit.  Regular dental and eye exams.  Immunizations.  Screening for certain conditions.  Healthy lifestyle choices, such as: ? Eating a healthy diet. ? Getting regular exercise. ? Not using drugs or products that contain nicotine and tobacco. ? Limiting alcohol use. What can I expect for my preventive care visit? Physical exam Your health care provider will check your:  Height and weight. These may be used to calculate your BMI (body mass index). BMI is a measurement that tells if you are at a healthy weight.  Heart rate and blood pressure.  Body temperature.  Skin for abnormal spots. Counseling Your health care provider may ask you questions about your:  Past medical problems.  Family's medical history.  Alcohol, tobacco, and drug use.  Emotional well-being.  Home life and relationship well-being.  Sexual activity.  Diet, exercise, and sleep habits.  History of  falls.  Memory and ability to understand (cognition).  Work and work Statistician.  Access to firearms. What immunizations do I need? Vaccines are usually given at various ages, according to a schedule. Your health care provider will recommend vaccines for you based on your age, medical history, and lifestyle or other factors, such as travel or where you work.   What tests do I need? Blood tests  Lipid and cholesterol levels. These may be checked every 5 years, or more often depending on your overall health.  Hepatitis C test.  Hepatitis B test. Screening  Lung cancer screening. You may have this screening every year starting at age 38 if you have a 30-pack-year history of smoking and currently smoke or have quit within the past 15 years.  Colorectal cancer screening. ? All adults should have this screening starting at age 62 and continuing until age 74. ? Your health care provider may recommend screening at age 41 if you are at increased risk. ? You will have tests every 1-10 years, depending on your results and the type of screening test.  Prostate cancer screening. Recommendations will vary depending on your family history and other risks.  Genital exam to check for testicular cancer or hernias.  Diabetes screening. ? This is done by checking your blood sugar (glucose) after you have not eaten for a while (fasting). ? You may have this done every 1-3 years.  Abdominal aortic aneurysm (AAA) screening. You may need this if you are a current or former smoker.  STD (sexually transmitted disease) testing, if you are at risk. Follow these instructions at home:  Eating and drinking  Eat a diet that includes fresh fruits and vegetables, whole grains, lean protein, and low-fat dairy products. Limit your intake of foods with high amounts of sugar, saturated fats, and salt.  Take vitamin and mineral supplements as recommended by your health care provider.  Do not drink alcohol if  your health care provider tells you not to drink.  If you drink alcohol: ? Limit how much you have to 0-2 drinks a day. ? Be aware of how much alcohol is in your drink. In the [place] one drink equals one 12 oz bottle of beer (355 mL), one 5 oz glass of wine (148 mL), or one 1 oz glass of hard liquor (44 mL).   Lifestyle  Take daily care of your teeth and gums. Brush your teeth every morning and night with fluoride toothpaste. Floss one time each day.  Stay active. Exercise for at least 30 minutes 5 or more days each week.  Do not use any products that contain nicotine or tobacco, such as cigarettes, e-cigarettes, and chewing tobacco. If you need help quitting, ask your health care provider.  Do not use drugs.  If you are sexually active, practice safe sex. Use a condom or other form of protection to prevent STIs (sexually transmitted infections).  Talk with your health care provider about taking a low-dose aspirin or statin.  Find healthy ways to cope with stress, such as: ? Meditation, yoga, or listening to music. ? Journaling. ? Talking to a trusted person. ? Spending time with friends and family. Safety  Always wear your seat belt while driving or riding in a vehicle.  Do not drive: ? If you have been drinking alcohol. Do not ride with someone who has been drinking. ? When you are tired or distracted. ? While texting.  Wear a helmet and other protective equipment during sports activities.  If you have firearms in your house, make sure you follow all gun safety procedures. What's next?  Visit your health care provider once a year for an annual wellness visit.  Ask your health care provider how often you should have your eyes and teeth checked.  Stay up to date on all vaccines. This information is not intended to replace advice given to you by your health care provider. Make sure you discuss any questions you have with your health care provider. Document Revised:  11/09/2018 Document Reviewed: 02/04/2018 Elsevier Patient Education  2021 [place].      Signed, Merri Ray, MD [facility]

## 2020-06-21 LAB — BASIC METABOLIC PANEL
BUN: 20 mg/dL (ref 6–23)
CO2: 24 mEq/L (ref 19–32)
Calcium: 9.3 mg/dL (ref 8.4–10.5)
Chloride: 95 mEq/L — ABNORMAL LOW (ref 96–112)
Creatinine, Ser: 1.32 mg/dL (ref 0.40–1.50)
GFR: 53.45 mL/min — ABNORMAL LOW (ref >60.00)
Glucose, Bld: 99 mg/dL (ref 70–99)
Potassium: 3.6 mEq/L (ref 3.5–5.1)
Sodium: 128 mEq/L — ABNORMAL LOW (ref 135–145)

## 2020-06-23 DIAGNOSIS — U071 COVID-19: Secondary | ICD-10-CM | POA: Diagnosis not present

## 2020-06-24 DIAGNOSIS — H2512 Age-related nuclear cataract, left eye: Secondary | ICD-10-CM | POA: Diagnosis not present

## 2020-07-07 ENCOUNTER — Other Ambulatory Visit: Payer: Self-pay | Admitting: Family Medicine

## 2020-07-07 DIAGNOSIS — E785 Hyperlipidemia, unspecified: Secondary | ICD-10-CM

## 2020-07-09 NOTE — Telephone Encounter (Signed)
Pt called for a refill on the pravastin please advise

## 2020-07-12 DIAGNOSIS — H2512 Age-related nuclear cataract, left eye: Secondary | ICD-10-CM | POA: Diagnosis not present

## 2020-07-21 ENCOUNTER — Other Ambulatory Visit: Payer: Self-pay | Admitting: Family Medicine

## 2020-07-21 DIAGNOSIS — K219 Gastro-esophageal reflux disease without esophagitis: Secondary | ICD-10-CM

## 2020-07-24 ENCOUNTER — Other Ambulatory Visit: Payer: Self-pay

## 2020-07-24 DIAGNOSIS — H2511 Age-related nuclear cataract, right eye: Secondary | ICD-10-CM | POA: Diagnosis not present

## 2020-07-26 DIAGNOSIS — H2511 Age-related nuclear cataract, right eye: Secondary | ICD-10-CM | POA: Diagnosis not present

## 2020-08-09 ENCOUNTER — Other Ambulatory Visit: Payer: Self-pay

## 2020-08-09 ENCOUNTER — Ambulatory Visit (INDEPENDENT_AMBULATORY_CARE_PROVIDER_SITE_OTHER): Payer: BC Managed Care – PPO | Admitting: Family Medicine

## 2020-08-09 VITALS — BP 138/80 | HR 63 | Temp 98.3°F | Resp 17 | Ht 73.0 in | Wt 218.4 lb

## 2020-08-09 DIAGNOSIS — E871 Hypo-osmolality and hyponatremia: Secondary | ICD-10-CM

## 2020-08-09 DIAGNOSIS — Z8616 Personal history of COVID-19: Secondary | ICD-10-CM | POA: Diagnosis not present

## 2020-08-09 DIAGNOSIS — I10 Essential (primary) hypertension: Secondary | ICD-10-CM

## 2020-08-09 DIAGNOSIS — R600 Localized edema: Secondary | ICD-10-CM | POA: Diagnosis not present

## 2020-08-09 DIAGNOSIS — R29898 Other symptoms and signs involving the musculoskeletal system: Secondary | ICD-10-CM | POA: Diagnosis not present

## 2020-08-09 LAB — BASIC METABOLIC PANEL
BUN: 25 mg/dL — ABNORMAL HIGH (ref 6–23)
CO2: 25 mEq/L (ref 19–32)
Calcium: 9.4 mg/dL (ref 8.4–10.5)
Chloride: 97 mEq/L (ref 96–112)
Creatinine, Ser: 1.13 mg/dL (ref 0.40–1.50)
GFR: 64.34 mL/min (ref >60.00)
Glucose, Bld: 99 mg/dL (ref 70–99)
Potassium: 4 mEq/L (ref 3.5–5.1)
Sodium: 132 mEq/L — ABNORMAL LOW (ref 135–145)

## 2020-08-09 MED ORDER — AMLODIPINE BESYLATE 10 MG PO TABS: 10.0000 mg | ORAL_TABLET | Freq: Every day | ORAL | 1 refills | Status: DC

## 2020-08-09 NOTE — Patient Instructions (Signed)
Stop hydrochlorothiazide.  Restart amlodipine.  Okay to continue compression stockings but if you notice any increase in swelling, we may need to stop amlodipine again and pick a different medication.  Let me know.  I will recheck sodium level today, and again in 2 weeks to see if the hydrochlorothiazide was a problem.  We can discuss your leg symptoms further in 2 weeks unless that is worsening sooner.  If it is related to your sodium, I expect that to improve as well.  Discuss the Humira and dry skin with your specialist.  Okay to use Aveeno or other hydrating lotion over-the-counter in the meantime.  Return to the clinic or go to the nearest emergency room if any of your symptoms worsen or new symptoms occur. Hyponatremia Hyponatremia is when the amount of salt (sodium) in your blood is too low. When sodium levels are low, your cells absorb extra water, which causes them to swell. The swelling happens throughout the body,but it mostly affects the brain. What are the causes? This condition may be caused by: Certain medical conditions, such as: Heart, kidney, or liver problems. Thyroid problems. Adrenal gland problems. Metabolic conditions, such as Addison disease or syndrome of inappropriate antidiuretic hormone (SIADH). Severe vomiting or diarrhea. Certain medicines or illegal drugs. Dehydration. Drinking too much water. Eating a diet that is low in sodium. Large burns on your body. Excessive sweating. What increases the risk? You are more likely to develop this condition if you: Have long-term (chronic) kidney disease. Have heart failure. Have a medical condition that causes frequent or excessive diarrhea. Participate in intense physical activities, such as marathon running. Take certain medicines that affect the sodium and fluid balance in the blood. Some of these medicine types include: Diuretics. NSAIDs. Some opioid pain medicines. Some antidepressants. Some seizure prevention  medicines. What are the signs or symptoms? Symptoms of this condition include: Headache. Nausea and vomiting. Being very tired (lethargic). Muscle weakness and cramping. Loss of appetite. Feeling weak or light-headed. Severe symptoms of this condition include: Confusion. Agitation. Having a rapid heart rate. Passing out (fainting). Seizures. Coma. How is this diagnosed? This condition is diagnosed based on: A physical exam. Your medical history. Tests, including: Blood tests. Urine tests. How is this treated? Treatment for this condition depends on the cause. Treatment may include: Getting fluids through an IV that is inserted into one of your veins. Medicines to correct the sodium imbalance. If medicines are causing the condition, the medicines will need to be adjusted. Limiting your water or fluid intake to get the correct sodium balance. Monitoring in the hospital setting to closely watch your symptoms for improvement. Follow these instructions at home:  Take over-the-counter and prescription medicines only as told by your health care provider. Many medicines can make this condition worse. Talk with your health care provider about any medicines that you are currently taking. Carefully follow a recommended diet as told by your health care provider. Carefully follow instructions from your health care provider about fluid restrictions. Do not drink alcohol. Keep all follow-up visits as told by your health care provider. This is important. Contact a health care provider if: You develop worsening nausea, fatigue, headache, confusion, or weakness. Your symptoms go away and then return. You have problems following the recommended diet. Get help right away if: You have a seizure. You pass out. You have ongoing diarrhea or vomiting. Summary Hyponatremia is when the amount of salt (sodium) in your blood is too low. When sodium levels are  low, your cells absorb extra water,  which causes them to swell. The swelling happens throughout the body, but it mostly affects the brain. Treatment for this condition depends on the cause. It may include IV fluids, medicines, and limiting your fluid intake. This information is not intended to replace advice given to you by your health care provider. Make sure you discuss any questions you have with your healthcare provider. Document Revised: 12/25/2017 Document Reviewed: 12/25/2017 Elsevier Patient Education  2022 [employer].

## 2020-08-09 NOTE — Progress Notes (Signed)
Subjective:  Patient ID: Joseph Smith, male    DOB: [birth year]  Age: 75 y.o. MRN: [id number]  CC:  Chief Complaint  Patient presents with   Extremity Weakness    Pt reports after last OV contracted COVID 19 and was ill a few days, reports better now other than some weakness in his legs.  Pt is also here to discuss low sodium level from lab work.     HPI KYZER BLOWE presents for   COVID-19 infection Diagnosed 4/30. Received paxlovid. Overall has had continued improvement, does still some fatigue sensation in his legs. Knees and thighs seem to be more fatigued. Has noticed dry skin. No new soap - [place].  Takes Humira for ulcerative colitis. Aspercreme with lidocaine works better than voltaren for knee pain - minimal pain.   Hyponatremia Most recently 128 on April 27, 126 on April 1.  Previous sodium 136 in October 2021.  Borderline low of 133 in August 2021 but otherwise has been normal.  Creatinine borderline elevated at 1.32, up from 0.99 at last visit. He does take ACE inhibitor benazepril as well as thiazide diuretic for HTN. (Changed from amlodipine d/t LE edema, but swelling unchanged off amlodipine, and improves overnight, uses compression stockings).  No CP or dyspnea. EF 60-65% in 05/2019.  No recent prostate surgery.  No recent IVIG or mannitol.  No jaundice, last lipids were normal. No HA/dizziness.    Lab Results  Component Value Date   NA 128 (L) 06/20/2020   K 3.6 06/20/2020   CL 95 (L) 06/20/2020   CO2 24 06/20/2020   Lab Results  Component Value Date   CREATININE 1.32 06/20/2020     History Patient Active Problem List   Diagnosis Date Noted   Ulcerative colitis ([place]) 05/29/2016   Bruit 12/03/2012   Hyperglycemia 02/05/2011   Impotence due to erectile dysfunction 02/05/2011   ATRIAL FIBRILLATION 05/10/2010   PALPITATIONS 04/23/2010   CHEST PAIN-UNSPECIFIED 04/17/2009   Hyperlipidemia 04/11/2009   Essential hypertension 04/11/2009   GERD 04/11/2009    Past Medical History:  Diagnosis Date   Atrial fibrillation (HCC)    Chest pain, unspecified    Esophageal reflux    Family history of ischemic heart disease    Other and unspecified hyperlipidemia    Palpitations    Ulcerative colitis    Unspecified essential hypertension    Past Surgical History:  Procedure Laterality Date   ELBOW SURGERY     left   KNEE ARTHROSCOPY     rihgt   VASECTOMY     Allergies  Allergen Reactions   Codeine Itching    REACTION: Reaction not known   Prior to Admission medications   Medication Sig Start Date End Date Taking? Authorizing Provider  benazepril (LOTENSIN) 20 MG tablet Take 1 tablet (20 mg total) by mouth daily. 05/17/20   Wendie Agreste, MD  ELIQUIS 5 MG TABS tablet TAKE 1 TABLET(5 MG) BY MOUTH TWICE DAILY 06/18/20   Sande Rives E, PA-C  HUMIRA PEN 40 MG/0.4ML PNKT SMARTSIG:40 Milligram(s) SUB-Q Every 2 Weeks 05/16/20   [provider]  hydrochlorothiazide (MICROZIDE) 12.5 MG capsule TAKE 1 CAPSULE(12.5 MG) BY MOUTH DAILY 05/18/20   Wendie Agreste, MD  LORazepam (ATIVAN) 0.5 MG tablet Take 0.5 mg by mouth at bedtime as needed. 02/23/20   [provider]  metoprolol succinate (TOPROL-XL) 25 MG 24 hr tablet TAKE 3 TABLETS(75 MG) BY MOUTH DAILY 05/21/20   Lelon Perla, MD  omeprazole (PRILOSEC) 40 MG capsule TAKE 1 CAPSULE BY MOUTH EVERY DAY 07/24/20   Wendie Agreste, MD  pravastatin (PRAVACHOL) 40 MG tablet TAKE 1 TABLET(40 MG) BY MOUTH DAILY 07/09/20   Wendie Agreste, MD  triamcinolone cream (KENALOG) 0.1 % Apply topically 2 (two) times daily as needed. 04/04/19   Wendie Agreste, MD   Social History   Socioeconomic History   Marital status: Single    Spouse name: Not on file   Number of children: Not on file   Years of education: Not on file   Highest education level: Not on file  Occupational History   Not on file  Tobacco Use   Smoking status: Former    Years: 25.00    Pack years: 0.00     Types: Cigarettes, Cigars   Smokeless tobacco: Never  Substance and Sexual Activity   Alcohol use: Yes    Alcohol/week: 0.0 standard drinks    Comment: pt has about 10 drinks or varying type on average throughout one week    Drug use: No   Sexual activity: Yes  Other Topics Concern   Not on file  Social History Narrative   Married. Education: [employer]. Exercise: Weekly.   Social Determinants of Health   Financial Resource Strain: Not on file  Food Insecurity: Not on file  Transportation Needs: Not on file  Physical Activity: Not on file  Stress: Not on file  Social Connections: Not on file  Intimate Partner Violence: Not on file    Review of Systems Per HPI.   Objective:   Vitals:   08/09/20 1317 08/09/20 1323  BP: (!) 144/84 138/80  Pulse: 63   Resp: 17   Temp: 98.3 F (36.8 C)   TempSrc: Temporal   SpO2: 99%   Weight: 218 lb 6.4 oz (99.1 kg)   Height: 6' 1"  (1.854 m)      Physical Exam Vitals reviewed.  Constitutional:      Appearance: He is well-developed.  HENT:     Head: Normocephalic and atraumatic.  Neck:     Vascular: No carotid bruit or JVD.  Cardiovascular:     Rate and Rhythm: Normal rate and regular rhythm.     Heart sounds: Normal heart sounds. No murmur heard.   No gallop.  Pulmonary:     Effort: Pulmonary effort is normal. No respiratory distress.     Breath sounds: Normal breath sounds. No wheezing, rhonchi or rales.  Musculoskeletal:     Right lower leg: Edema present.     Left lower leg: Edema present.     Comments: Some dry/scaly skin on lower legs without rash.  Trace to 1+ pedal edema at the lower third leg bilaterally.  Equal strength testing in lower extremities bilaterally.  Skin:    General: Skin is warm and dry.  Neurological:     Mental Status: He is alert and oriented to person, place, and time.  Psychiatric:        Mood and Affect: Mood normal.       Assessment & Plan:  CHIJIOKE LASSER is a 75 y.o. male  . Hyponatremia - Plan: Basic metabolic panel  Leg fatigue  History of COVID-19  Pedal edema - Plan: Basic metabolic panel  Essential hypertension - Plan: amLODipine (NORVASC) 10 MG tablet  Hyponatremia may be due to start of hydrochlorothiazide.  On further discussion did not notice change in edema with discontinuation of amlodipine.  Will change back to amlodipine 10 mg,  stop HCTZ, check BMP now and again in 2 weeks.  Fatigue in legs thought to be present since his COVID-19 infection, may be some residual symptoms but could also be related to hyponatremia.  Again we will recheck this in the next 2 weeks with ER/RTC precautions if worse sooner.  Recommended discussing dry skin with his specialist prescribing Humira, symptomatic care with over-the-counter Aveeno lotion if needed.  Meds ordered this encounter  Medications   amLODipine (NORVASC) 10 MG tablet    Sig: Take 1 tablet (10 mg total) by mouth daily.    Dispense:  90 tablet    Refill:  1   Patient Instructions  Stop hydrochlorothiazide.  Restart amlodipine.  Okay to continue compression stockings but if you notice any increase in swelling, we may need to stop amlodipine again and pick a different medication.  Let me know.  I will recheck sodium level today, and again in 2 weeks to see if the hydrochlorothiazide was a problem.  We can discuss your leg symptoms further in 2 weeks unless that is worsening sooner.  If it is related to your sodium, I expect that to improve as well.  Discuss the Humira and dry skin with your specialist.  Okay to use Aveeno or other hydrating lotion over-the-counter in the meantime.  Return to the clinic or go to the nearest emergency room if any of your symptoms worsen or new symptoms occur. Hyponatremia Hyponatremia is when the amount of salt (sodium) in your blood is too low. When sodium levels are low, your cells absorb extra water, which causes them to swell. The swelling happens throughout the body,but  it mostly affects the brain. What are the causes? This condition may be caused by: Certain medical conditions, such as: Heart, kidney, or liver problems. Thyroid problems. Adrenal gland problems. Metabolic conditions, such as Addison disease or syndrome of inappropriate antidiuretic hormone (SIADH). Severe vomiting or diarrhea. Certain medicines or illegal drugs. Dehydration. Drinking too much water. Eating a diet that is low in sodium. Large burns on your body. Excessive sweating. What increases the risk? You are more likely to develop this condition if you: Have long-term (chronic) kidney disease. Have heart failure. Have a medical condition that causes frequent or excessive diarrhea. Participate in intense physical activities, such as marathon running. Take certain medicines that affect the sodium and fluid balance in the blood. Some of these medicine types include: Diuretics. NSAIDs. Some opioid pain medicines. Some antidepressants. Some seizure prevention medicines. What are the signs or symptoms? Symptoms of this condition include: Headache. Nausea and vomiting. Being very tired (lethargic). Muscle weakness and cramping. Loss of appetite. Feeling weak or light-headed. Severe symptoms of this condition include: Confusion. Agitation. Having a rapid heart rate. Passing out (fainting). Seizures. Coma. How is this diagnosed? This condition is diagnosed based on: A physical exam. Your medical history. Tests, including: Blood tests. Urine tests. How is this treated? Treatment for this condition depends on the cause. Treatment may include: Getting fluids through an IV that is inserted into one of your veins. Medicines to correct the sodium imbalance. If medicines are causing the condition, the medicines will need to be adjusted. Limiting your water or fluid intake to get the correct sodium balance. Monitoring in the hospital setting to closely watch your symptoms  for improvement. Follow these instructions at home:  Take over-the-counter and prescription medicines only as told by your health care provider. Many medicines can make this condition worse. Talk with your health care provider  about any medicines that you are currently taking. Carefully follow a recommended diet as told by your health care provider. Carefully follow instructions from your health care provider about fluid restrictions. Do not drink alcohol. Keep all follow-up visits as told by your health care provider. This is important. Contact a health care provider if: You develop worsening nausea, fatigue, headache, confusion, or weakness. Your symptoms go away and then return. You have problems following the recommended diet. Get help right away if: You have a seizure. You pass out. You have ongoing diarrhea or vomiting. Summary Hyponatremia is when the amount of salt (sodium) in your blood is too low. When sodium levels are low, your cells absorb extra water, which causes them to swell. The swelling happens throughout the body, but it mostly affects the brain. Treatment for this condition depends on the cause. It may include IV fluids, medicines, and limiting your fluid intake. This information is not intended to replace advice given to you by your health care provider. Make sure you discuss any questions you have with your healthcare provider. Document Revised: 12/25/2017 Document Reviewed: 12/25/2017 Elsevier Patient Education  2022 [place],   Merri Ray, MD [facility] 08/09/20 2:07 PM

## 2020-08-14 ENCOUNTER — Telehealth: Payer: Self-pay | Admitting: Family Medicine

## 2020-08-14 NOTE — Telephone Encounter (Signed)
Left message for patient to schedule Annual Wellness Visit.  Please schedule with Nurse Health Advisor Leroy Kennedy, RN at [facility]

## 2020-08-17 ENCOUNTER — Telehealth: Payer: Self-pay | Admitting: Family Medicine

## 2020-08-17 ENCOUNTER — Other Ambulatory Visit: Payer: Self-pay

## 2020-08-17 DIAGNOSIS — K219 Gastro-esophageal reflux disease without esophagitis: Secondary | ICD-10-CM

## 2020-08-17 DIAGNOSIS — E785 Hyperlipidemia, unspecified: Secondary | ICD-10-CM

## 2020-08-17 DIAGNOSIS — I10 Essential (primary) hypertension: Secondary | ICD-10-CM

## 2020-08-17 MED ORDER — OMEPRAZOLE 40 MG PO CPDR: DELAYED_RELEASE_CAPSULE | ORAL | 1 refills | Status: DC

## 2020-08-17 MED ORDER — HYDROCHLOROTHIAZIDE 12.5 MG PO CAPS: ORAL_CAPSULE | ORAL | 1 refills | Status: DC

## 2020-08-17 MED ORDER — BENAZEPRIL HCL 20 MG PO TABS: 20.0000 mg | ORAL_TABLET | Freq: Every day | ORAL | 1 refills | Status: DC

## 2020-08-17 MED ORDER — PRAVASTATIN SODIUM 40 MG PO TABS: ORAL_TABLET | ORAL | 1 refills | Status: DC

## 2020-08-17 MED ORDER — AMLODIPINE BESYLATE 10 MG PO TABS: 10.0000 mg | ORAL_TABLET | Freq: Every day | ORAL | 1 refills | Status: DC

## 2020-08-17 NOTE — Telephone Encounter (Signed)
Pt would like to use [facility] on [address] in [place] moving forward can we send his scripts to them please.

## 2020-08-17 NOTE — Telephone Encounter (Signed)
Pharmacy has been updated.

## 2020-08-21 ENCOUNTER — Other Ambulatory Visit: Payer: Self-pay

## 2020-08-21 ENCOUNTER — Encounter: Payer: Self-pay | Admitting: Registered Nurse

## 2020-08-21 ENCOUNTER — Ambulatory Visit (INDEPENDENT_AMBULATORY_CARE_PROVIDER_SITE_OTHER): Payer: BC Managed Care – PPO | Admitting: Registered Nurse

## 2020-08-21 VITALS — BP 141/63 | HR 66 | Temp 98.2°F | Resp 18 | Ht 73.0 in | Wt 221.0 lb

## 2020-08-21 DIAGNOSIS — E871 Hypo-osmolality and hyponatremia: Secondary | ICD-10-CM | POA: Diagnosis not present

## 2020-08-21 DIAGNOSIS — R609 Edema, unspecified: Secondary | ICD-10-CM | POA: Diagnosis not present

## 2020-08-21 NOTE — Progress Notes (Signed)
Established Patient Office Visit  Subjective:  Patient ID: Joseph Smith, male    DOB: 15-May-1945  Age: 75 y.o. MRN: [id number]  CC:  Chief Complaint  Patient presents with   Follow-up    Patient states she is here for 2 week follow up    HPI Joseph Smith presents for follow up   2 week follow up  Seen by PCP Dr. Carlota Raspberry for dependent edema Onset a few months ago Stopped benazepril and amlodipine, switched to HCTZ. Unfortunately experienced hyponatremia, had to stop diuretics. Has since recovered to 132 as of 12 days ago.  Today notes ongoing swelling. No change with changes in medication. Wears OTC compression stockings - very tight, hard to get on and off, but effective despite discomfort. Stands 6 hours at work daily, cannot tolerate going without these. Has not seen vascular previously  Last echo around 1 year ago, wnl.   Last visit with cardiology on 04/04/20 with Sande Rives, PA-C, who noted stability  Since that visit he did have COVID. Reports mild symptoms. No changes to CV symptoms since that course. Dependent edema has been persistent since prior to this course of COVID.  Past Medical History:  Diagnosis Date   Atrial fibrillation (HCC)    Chest pain, unspecified    Esophageal reflux    Family history of ischemic heart disease    Other and unspecified hyperlipidemia    Palpitations    Ulcerative colitis    Unspecified essential hypertension     Past Surgical History:  Procedure Laterality Date   ELBOW SURGERY     left   KNEE ARTHROSCOPY     rihgt   VASECTOMY      Family History  Problem Relation Age of Onset   Stroke Brother    Coronary artery disease Other        family history    Social History   Socioeconomic History   Marital status: Single    Spouse name: Not on file   Number of children: Not on file   Years of education: Not on file   Highest education level: Not on file  Occupational History   Not on file  Tobacco Use    Smoking status: Former    Years: 25.00    Pack years: 0.00    Types: Cigarettes, Cigars   Smokeless tobacco: Never  Substance and Sexual Activity   Alcohol use: Yes    Alcohol/week: 0.0 standard drinks    Comment: pt has about 10 drinks or varying type on average throughout one week    Drug use: No   Sexual activity: Yes  Other Topics Concern   Not on file  Social History Narrative   Married. Education: [employer]. Exercise: Weekly.   Social Determinants of Health   Financial Resource Strain: Not on file  Food Insecurity: Not on file  Transportation Needs: Not on file  Physical Activity: Not on file  Stress: Not on file  Social Connections: Not on file  Intimate Partner Violence: Not on file    Outpatient Medications Prior to Visit  Medication Sig Dispense Refill   amLODipine (NORVASC) 10 MG tablet Take 1 tablet (10 mg total) by mouth daily. 90 tablet 1   benazepril (LOTENSIN) 20 MG tablet Take 1 tablet (20 mg total) by mouth daily. 90 tablet 1   ELIQUIS 5 MG TABS tablet TAKE 1 TABLET(5 MG) BY MOUTH TWICE DAILY 180 tablet 1   HUMIRA PEN 40 MG/0.4ML PNKT SMARTSIG:40 Milligram(s)  SUB-Q Every 2 Weeks     hydrochlorothiazide (MICROZIDE) 12.5 MG capsule TAKE 1 CAPSULE(12.5 MG) BY MOUTH DAILY 90 capsule 1   LORazepam (ATIVAN) 0.5 MG tablet Take 0.5 mg by mouth at bedtime as needed.     metoprolol succinate (TOPROL-XL) 25 MG 24 hr tablet TAKE 3 TABLETS(75 MG) BY MOUTH DAILY 270 tablet 3   omeprazole (PRILOSEC) 40 MG capsule TAKE 1 CAPSULE BY MOUTH EVERY DAY 90 capsule 1   pravastatin (PRAVACHOL) 40 MG tablet TAKE 1 TABLET(40 MG) BY MOUTH DAILY 90 tablet 1   triamcinolone cream (KENALOG) 0.1 % Apply topically 2 (two) times daily as needed. 80 g 1   No facility-administered medications prior to visit.    Allergies  Allergen Reactions   Codeine Itching    REACTION: Reaction not known    ROS Review of Systems  Constitutional: Negative.   HENT: Negative.    Eyes: Negative.    Respiratory: Negative.    Cardiovascular:  Positive for leg swelling. Negative for palpitations.  Gastrointestinal: Negative.   Genitourinary: Negative.   Musculoskeletal: Negative.   Skin: Negative.   Neurological: Negative.   Psychiatric/Behavioral: Negative.    All other systems reviewed and are negative.    Objective:    Physical Exam Constitutional:      General: He is not in acute distress.    Appearance: Normal appearance. He is normal weight. He is not ill-appearing, toxic-appearing or diaphoretic.  Cardiovascular:     Rate and Rhythm: Normal rate and regular rhythm.     Heart sounds: Normal heart sounds. No murmur heard.   No friction rub. No gallop.  Pulmonary:     Effort: Pulmonary effort is normal. No respiratory distress.     Breath sounds: Normal breath sounds. No stridor. No wheezing, rhonchi or rales.  Chest:     Chest wall: No tenderness.  Neurological:     General: No focal deficit present.     Mental Status: He is alert and oriented to person, place, and time. Mental status is at baseline.  Psychiatric:        Mood and Affect: Mood normal.        Behavior: Behavior normal.        Thought Content: Thought content normal.        Judgment: Judgment normal.    BP (!) 141/63   Pulse 66   Temp 98.2 F (36.8 C) (Temporal)   Resp 18   Ht _0  (1.854 m)   Wt 221 lb (100.2 kg)   SpO2 97%   BMI 29.16 kg/m  Wt Readings from Last 3 Encounters:  08/21/20 221 lb (100.2 kg)  08/09/20 218 lb 6.4 oz (99.1 kg)  06/20/20 219 lb 12.8 oz (99.7 kg)     There are no preventive care reminders to display for this patient.  There are no preventive care reminders to display for this patient.  Lab Results  Component Value Date   TSH 1.570 06/01/2019   Lab Results  Component Value Date   WBC 7.1 12/09/2019   HGB 12.1 (L) 12/09/2019   HCT 36.2 (L) 12/09/2019   MCV 92 12/09/2019   PLT 290 12/09/2019   Lab Results  Component Value Date   NA 132 (L)  08/09/2020   K 4.0 08/09/2020   CO2 25 08/09/2020   GLUCOSE 99 08/09/2020   BUN 25 (H) 08/09/2020   CREATININE 1.13 08/09/2020   BILITOT 0.4 05/25/2020   ALKPHOS 37 (L) 05/25/2020  AST 16 05/25/2020   ALT 14 05/25/2020   PROT 6.3 05/25/2020   ALBUMIN 4.2 05/25/2020   CALCIUM 9.4 08/09/2020   EGFR 80 05/25/2020   GFR 64.34 08/09/2020   Lab Results  Component Value Date   CHOL 195 05/25/2020   Lab Results  Component Value Date   HDL 89 05/25/2020   Lab Results  Component Value Date   LDLCALC 93 05/25/2020   Lab Results  Component Value Date   TRIG 70 05/25/2020   Lab Results  Component Value Date   CHOLHDL 2.2 05/25/2020   Lab Results  Component Value Date   HGBA1C 5.5 04/04/2019      Assessment & Plan:   Problem List Items Addressed This Visit   None Visit Diagnoses     Dependent edema    -  Primary   Relevant Orders   Ambulatory referral to Vascular Surgery   Hyponatremia       Relevant Orders   Comprehensive metabolic panel       No orders of the defined types were placed in this encounter.   Follow-up: Return in about 1 week (around 08/28/2020) for labs.   PLAN Refer to vascular. Likely just needs to be fitted for proper compression stockings. Very reassuring that swelling reduces with elevation and pressure.  Will hold any further fluid pills. Risks far outweigh benefits. He will return in next 2 days for repeat CMP. Expecting sodium to return to normal range. Patient encouraged to call clinic with any questions, comments, or concerns.  Discussed plan with pt PCP Dr. Carlota Raspberry who is in agreement.  Maximiano Coss, NP

## 2020-08-21 NOTE — Patient Instructions (Addendum)
Mr. Ardizzone -  Let's get you in to see a vascular specialist - I'll have a group call you in the coming week or two.  There are risks associate with low sodium and other electrolyte imbalances that, frankly, aren't worth the risk at this time.  I'll CC Dr. Charlane Ferretti to let him know our plan and to see if he has any input.  Ok to continue current BP meds.  Thank you  Rich     If you have lab work done today you will be contacted with your lab results within the next 2 weeks.  If you have not heard from [place] then please contact us. The fastest way to get your results is to register for My Chart.   IF you received an x-ray today, you will receive an invoice from [facility]. Please contact [facility] at [phone] with questions or concerns regarding your invoice.   IF you received labwork today, you will receive an invoice from [facility]. Please contact [facility] at [phone] with questions or concerns regarding your invoice.   Our billing staff will not be able to assist you with questions regarding bills from these companies.  You will be contacted with the lab results as soon as they are available. The fastest way to get your results is to activate your My Chart account. Instructions are located on the last page of this paperwork. If you have not heard from [place] regarding the results in 2 weeks, please contact this office.

## 2020-08-24 ENCOUNTER — Ambulatory Visit: Payer: Medicare Other | Admitting: Registered Nurse

## 2020-08-28 ENCOUNTER — Other Ambulatory Visit (INDEPENDENT_AMBULATORY_CARE_PROVIDER_SITE_OTHER): Payer: BC Managed Care – PPO

## 2020-08-28 ENCOUNTER — Other Ambulatory Visit: Payer: Self-pay

## 2020-08-28 DIAGNOSIS — E871 Hypo-osmolality and hyponatremia: Secondary | ICD-10-CM

## 2020-08-29 LAB — COMPREHENSIVE METABOLIC PANEL
ALT: 16 U/L (ref 0–53)
AST: 20 U/L (ref 0–37)
Albumin: 4.4 g/dL (ref 3.5–5.2)
Alkaline Phosphatase: 41 U/L (ref 39–117)
BUN: 21 mg/dL (ref 6–23)
CO2: 22 mEq/L (ref 19–32)
Calcium: 9.1 mg/dL (ref 8.4–10.5)
Chloride: 102 mEq/L (ref 96–112)
Creatinine, Ser: 1.41 mg/dL (ref 0.40–1.50)
GFR: 49.31 mL/min — ABNORMAL LOW (ref >60.00)
Glucose, Bld: 101 mg/dL — ABNORMAL HIGH (ref 70–99)
Potassium: 4.1 mEq/L (ref 3.5–5.1)
Sodium: 135 mEq/L (ref 135–145)
Total Bilirubin: 0.3 mg/dL (ref 0.2–1.2)
Total Protein: 6.6 g/dL (ref 6.0–8.3)

## 2020-08-30 NOTE — Progress Notes (Signed)
FYI - sodium back within range, renal function took a dive - I'll defer to your plan for Joseph Smith as you are much more familiar with him and I think he has a lot of trust in you and perhaps less in me - thanks

## 2020-09-04 ENCOUNTER — Telehealth: Payer: Self-pay

## 2020-09-04 NOTE — Telephone Encounter (Signed)
Patient called back about his lab results. I spoke with the patient and he understood and will return for labs in a couple of weeks.

## 2020-09-05 ENCOUNTER — Other Ambulatory Visit: Payer: Self-pay

## 2020-09-05 DIAGNOSIS — R944 Abnormal results of kidney function studies: Secondary | ICD-10-CM

## 2020-09-10 ENCOUNTER — Other Ambulatory Visit: Payer: Self-pay

## 2020-09-10 DIAGNOSIS — I8393 Asymptomatic varicose veins of bilateral lower extremities: Secondary | ICD-10-CM

## 2020-09-12 NOTE — Telephone Encounter (Signed)
See message below °

## 2020-09-19 ENCOUNTER — Other Ambulatory Visit: Payer: Self-pay

## 2020-09-19 ENCOUNTER — Ambulatory Visit (INDEPENDENT_AMBULATORY_CARE_PROVIDER_SITE_OTHER): Payer: BC Managed Care – PPO | Admitting: Physician Assistant

## 2020-09-19 ENCOUNTER — Ambulatory Visit (HOSPITAL_COMMUNITY)
Admission: RE | Admit: 2020-09-19 | Discharge: 2020-09-19 | Disposition: A | Discharge status: Home / Self Care | Payer: BC Managed Care – PPO | Source: Ambulatory Visit | Attending: Vascular Surgery | Admitting: Vascular Surgery

## 2020-09-19 VITALS — BP 127/73 | HR 53 | Temp 97.7°F | Resp 20 | Ht 73.0 in | Wt 222.7 lb

## 2020-09-19 DIAGNOSIS — I8393 Asymptomatic varicose veins of bilateral lower extremities: Secondary | ICD-10-CM | POA: Diagnosis not present

## 2020-09-19 DIAGNOSIS — M7989 Other specified soft tissue disorders: Secondary | ICD-10-CM

## 2020-09-19 DIAGNOSIS — I872 Venous insufficiency (chronic) (peripheral): Secondary | ICD-10-CM

## 2020-09-19 DIAGNOSIS — I890 Lymphedema, not elsewhere classified: Secondary | ICD-10-CM

## 2020-09-19 NOTE — Progress Notes (Signed)
Requested by:  Wendie Agreste, MD [address] [place],  [place] [address]  Reason for consultation: BLE edema   History of Present Illness   Joseph Smith is a 75 y.o. (04/01/1945) male who presents for evaluation of bilateral lower extremity edema. He states that approximately 3 months ago he had Covid and also around same time was started on Humira for UC. After this he developed bilateral lower extremity edema. The only thing he could attribute to the swelling was starting Humira so he has since stopped it 2 weeks ago. Since stopping it he has not noticed any change in the swelling. He says upon first waking the swelling is usually improved but after just a few short minutes of being up it already returns and progresses as day goes on. He elevates in recliner daily with minimal improvement. His wife also bought him OTC compression stockings, which he has been wearing to work. He says they do help with swelling but he barely tolerates them as he feels they are extremely hard to put on and very tight. He has not pain or other associated symptoms. He works 4 x/week for 6 hours standing. He says he is otherwise very active. He reports no kidney disease or heart issues. He sees Cardiologist regularly. He has no history of DVT. No family history of DVT or venous disease.   Venous symptoms include: swelling Onset/duration:  3 months Occupation:  Set designer Aggravating factors: sitting, standing, ambulating Alleviating factors: elevation Compression:  knee high Helps:  yes Pain medications:  none Previous vein procedures:  none History of DVT:  no  Past Medical History:  Diagnosis Date   Atrial fibrillation (HCC)    Chest pain, unspecified    Esophageal reflux    Family history of ischemic heart disease    Other and unspecified hyperlipidemia    Palpitations    Ulcerative colitis    Unspecified essential hypertension     Past Surgical History:  Procedure  Laterality Date   ELBOW SURGERY     left   KNEE ARTHROSCOPY     rihgt   VASECTOMY      Social History   Socioeconomic History   Marital status: Single    Spouse name: Not on file   Number of children: Not on file   Years of education: Not on file   Highest education level: Not on file  Occupational History   Not on file  Tobacco Use   Smoking status: Former    Years: 25.00    Types: Cigarettes, Cigars   Smokeless tobacco: Never  Substance and Sexual Activity   Alcohol use: Yes    Alcohol/week: 0.0 standard drinks    Comment: pt has about 10 drinks or varying type on average throughout one week    Drug use: No   Sexual activity: Yes  Other Topics Concern   Not on file  Social History Narrative   Married. Education: [employer]. Exercise: Weekly.   Social Determinants of Health   Financial Resource Strain: Not on file  Food Insecurity: Not on file  Transportation Needs: Not on file  Physical Activity: Not on file  Stress: Not on file  Social Connections: Not on file  Intimate Partner Violence: Not on file    Family History  Problem Relation Age of Onset   Stroke Brother    Coronary artery disease Other        family history    Current  Outpatient Medications  Medication Sig Dispense Refill   amLODipine (NORVASC) 10 MG tablet Take 1 tablet (10 mg total) by mouth daily. 90 tablet 1   benazepril (LOTENSIN) 20 MG tablet Take 1 tablet (20 mg total) by mouth daily. 90 tablet 1   ELIQUIS 5 MG TABS tablet TAKE 1 TABLET(5 MG) BY MOUTH TWICE DAILY 180 tablet 1   metoprolol succinate (TOPROL-XL) 25 MG 24 hr tablet TAKE 3 TABLETS(75 MG) BY MOUTH DAILY 270 tablet 3   omeprazole (PRILOSEC) 40 MG capsule TAKE 1 CAPSULE BY MOUTH EVERY DAY 90 capsule 1   pravastatin (PRAVACHOL) 40 MG tablet TAKE 1 TABLET(40 MG) BY MOUTH DAILY 90 tablet 1   triamcinolone cream (KENALOG) 0.1 % Apply topically 2 (two) times daily as needed. 80 g 1   HUMIRA PEN 40 MG/0.4ML PNKT SMARTSIG:40  Milligram(s) SUB-Q Every 2 Weeks (Patient not taking: Reported on 09/19/2020)     hydrochlorothiazide (MICROZIDE) 12.5 MG capsule TAKE 1 CAPSULE(12.5 MG) BY MOUTH DAILY (Patient not taking: Reported on 09/19/2020) 90 capsule 1   LORazepam (ATIVAN) 0.5 MG tablet Take 0.5 mg by mouth at bedtime as needed. (Patient not taking: Reported on 09/19/2020)     No current facility-administered medications for this visit.    Allergies  Allergen Reactions   Codeine Itching    REACTION: Reaction not known    REVIEW OF SYSTEMS (negative unless checked):   Cardiac:  []  Chest pain or chest pressure? []  Shortness of breath upon activity? []  Shortness of breath when lying flat? []  Irregular heart rhythm?  Vascular:  []  Pain in calf, thigh, or hip brought on by walking? []  Pain in feet at night that wakes you up from your sleep? []  Blood clot in your veins? [x]  Leg swelling?  Pulmonary:  []  Oxygen at home? []  Productive cough? []  Wheezing?  Neurologic:  []  Sudden weakness in arms or legs? []  Sudden numbness in arms or legs? []  Sudden onset of difficult speaking or slurred speech? []  Temporary loss of vision in one eye? []  Problems with dizziness?  Gastrointestinal:  []  Blood in stool? []  Vomited blood?  Genitourinary:  []  Burning when urinating? []  Blood in urine?  Psychiatric:  []  Major depression  Hematologic:  []  Bleeding problems? []  Problems with blood clotting?  Dermatologic:  []  Rashes or ulcers?  Constitutional:  []  Fever or chills?  Ear/Nose/Throat:  []  Change in hearing? []  Nose bleeds? []  Sore throat?  Musculoskeletal:  []  Back pain? []  Joint pain? []  Muscle pain?   Physical Examination     Vitals:   09/19/20 0938  BP: 127/73  Pulse: (!) 53  Resp: 20  Temp: 97.7 F (36.5 C)  TempSrc: Temporal  SpO2: 100%  Weight: 222 lb 11.2 oz (101 kg)  Height: 6' 1"  (1.854 m)   Body mass index is 29.38 kg/m.  General:  WDWN in NAD; vital signs documented  above Gait: Normal HENT: WNL, normocephalic Pulmonary: normal non-labored breathing , without wheezing Cardiac: regular HR, without  Murmurs without carotid bruit Vascular Exam/Pulses:2+ radial pulses, 2+ femoral, PT and DP pulses bilaterally. Feet warm and well perfused Extremities: with varicose veins, without reticular veins, with 1+ pitting edema bilaterally from knees down into dorsum of feet, without stasis pigmentation, without lipodermatosclerosis, without ulcers Musculoskeletal: no muscle wasting or atrophy  Neurologic: A&O X 3;  No focal weakness or paresthesias are detected Psychiatric:  The pt has Normal affect.  Non-invasive Vascular Imaging   BLE Venous Insufficiency Duplex (09/19/20):  RLE:  DVT and SVT  No GSV reflux  GSV diameter 0.329-0.489 No SSV reflux  CFV deep venous reflux  LLE: No DVT and SVT No GSV reflux  GSV diameter 0.363-0.499 No SSV reflux  No deep venous reflux   Medical Decision Making   Joseph Smith is a 75 y.o. male who presents with: RLE chronic venous insufficiency with edema. I think he likely has lymphedema vs edema related to medication (Humira) he was taking. I would suspect with time this should improve if it is Humira related now that he as stopped taking it. However clinically does appear to have lymphedema. His duplex shows only RLE deep reflux in CFV. Otherwise bilateral lower extremities with no significant reflux. No DVT or SVT. Based on the patient's history and examination, I recommend knee high 15-20 mmHg compression stockings, elevation, exercise, and refraining from prolonged sitting or standing. I will have him return in 4 weeks to see if he is having any improvement. He may be candidate for compression pumps which we can assess at that time.  Karoline Caldwell, PA-C [facility] Office: [phone]  09/19/2020, 10:29 AM  Clinic MD: [facility]

## 2020-10-17 ENCOUNTER — Ambulatory Visit (INDEPENDENT_AMBULATORY_CARE_PROVIDER_SITE_OTHER): Payer: BC Managed Care – PPO | Admitting: Physician Assistant

## 2020-10-17 ENCOUNTER — Encounter: Payer: Self-pay | Admitting: Physician Assistant

## 2020-10-17 ENCOUNTER — Other Ambulatory Visit: Payer: Self-pay

## 2020-10-17 VITALS — BP 130/76 | HR 59 | Temp 98.0°F | Ht 73.0 in | Wt 221.3 lb

## 2020-10-17 DIAGNOSIS — I872 Venous insufficiency (chronic) (peripheral): Secondary | ICD-10-CM

## 2020-10-17 DIAGNOSIS — I890 Lymphedema, not elsewhere classified: Secondary | ICD-10-CM

## 2020-10-17 NOTE — Progress Notes (Signed)
Office Note     CC:  follow up Requesting Provider:  Wendie Agreste, MD  HPI: Joseph Smith is a 75 y.o. ([birth year]) male who presents for reevaluation of bilateral lower extremity edema thought to be lymphedema.  He was seen 1 month ago and at that time purchased knee-high compression stockings.  He has been making an effort to elevate his legs and exercising on a daily basis.  He states he is still battling edema bilateral lower extremities however does notice some improvement.  He denies any open wounds.  He denies any fevers, chills, nausea/vomiting.  He has no history of DVT.  Past medical history also significant for ulcerative colitis for which she was on Humira.  Patient believes symptoms improved since discontinuing Humira.  His ulcerative colitis is in remission currently.   Past Medical History:  Diagnosis Date   Atrial fibrillation (HCC)    Chest pain, unspecified    Esophageal reflux    Family history of ischemic heart disease    Other and unspecified hyperlipidemia    Palpitations    Ulcerative colitis    Unspecified essential hypertension     Past Surgical History:  Procedure Laterality Date   ELBOW SURGERY     left   KNEE ARTHROSCOPY     rihgt   VASECTOMY      Social History   Socioeconomic History   Marital status: Single    Spouse name: Not on file   Number of children: Not on file   Years of education: Not on file   Highest education level: Not on file  Occupational History   Not on file  Tobacco Use   Smoking status: Former    Years: 25.00    Types: Cigarettes, Cigars   Smokeless tobacco: Never  Substance and Sexual Activity   Alcohol use: Yes    Alcohol/week: 0.0 standard drinks    Comment: pt has about 10 drinks or varying type on average throughout one week    Drug use: No   Sexual activity: Yes  Other Topics Concern   Not on file  Social History Narrative   Married. Education: [employer]. Exercise: Weekly.   Social Determinants of  Health   Financial Resource Strain: Not on file  Food Insecurity: Not on file  Transportation Needs: Not on file  Physical Activity: Not on file  Stress: Not on file  Social Connections: Not on file  Intimate Partner Violence: Not on file    Family History  Problem Relation Age of Onset   Stroke Brother    Coronary artery disease Other        family history    Current Outpatient Medications  Medication Sig Dispense Refill   amLODipine (NORVASC) 10 MG tablet Take 1 tablet (10 mg total) by mouth daily. 90 tablet 1   benazepril (LOTENSIN) 20 MG tablet Take 1 tablet (20 mg total) by mouth daily. 90 tablet 1   ELIQUIS 5 MG TABS tablet TAKE 1 TABLET(5 MG) BY MOUTH TWICE DAILY 180 tablet 1   metoprolol succinate (TOPROL-XL) 25 MG 24 hr tablet TAKE 3 TABLETS(75 MG) BY MOUTH DAILY 270 tablet 3   omeprazole (PRILOSEC) 40 MG capsule TAKE 1 CAPSULE BY MOUTH EVERY DAY 90 capsule 1   pravastatin (PRAVACHOL) 40 MG tablet TAKE 1 TABLET(40 MG) BY MOUTH DAILY 90 tablet 1   triamcinolone cream (KENALOG) 0.1 % Apply topically 2 (two) times daily as needed. 80 g 1   HUMIRA PEN 40 MG/0.4ML PNKT  SMARTSIG:40 Milligram(s) SUB-Q Every 2 Weeks (Patient not taking: No sig reported)     hydrochlorothiazide (MICROZIDE) 12.5 MG capsule TAKE 1 CAPSULE(12.5 MG) BY MOUTH DAILY (Patient not taking: No sig reported) 90 capsule 1   LORazepam (ATIVAN) 0.5 MG tablet Take 0.5 mg by mouth at bedtime as needed. (Patient not taking: No sig reported)     No current facility-administered medications for this visit.    Allergies  Allergen Reactions   Codeine Itching    REACTION: Reaction not known     REVIEW OF SYSTEMS:   [X]  denotes positive finding, [ ]  denotes negative finding Cardiac  Comments:  Chest pain or chest pressure:    Shortness of breath upon exertion:    Short of breath when lying flat:    Irregular heart rhythm:        Vascular    Pain in calf, thigh, or hip brought on by ambulation:    Pain in  feet at night that wakes you up from your sleep:     Blood clot in your veins:    Leg swelling:         Pulmonary    Oxygen at home:    Productive cough:     Wheezing:         Neurologic    Sudden weakness in arms or legs:     Sudden numbness in arms or legs:     Sudden onset of difficulty speaking or slurred speech:    Temporary loss of vision in one eye:     Problems with dizziness:         Gastrointestinal    Blood in stool:     Vomited blood:         Genitourinary    Burning when urinating:     Blood in urine:        Psychiatric    Major depression:         Hematologic    Bleeding problems:    Problems with blood clotting too easily:        Skin    Rashes or ulcers:        Constitutional    Fever or chills:      PHYSICAL EXAMINATION:  Vitals:   10/17/20 1502  BP: 130/76  Pulse: (!) 59  Temp: 98 F (36.7 C)  TempSrc: Temporal  SpO2: 98%  Weight: 221 lb 4.8 oz (100.4 kg)  Height: 6' 1"  (1.854 m)    General:  WDWN in NAD; vital signs documented above Gait: Not observed HENT: WNL, normocephalic Pulmonary: normal non-labored breathing , without Rales, rhonchi,  wheezing Cardiac: regular HR Abdomen: soft, NT, no masses Skin: without rashes Vascular Exam/Pulses:  Right Left  Radial 2+ (normal) 2+ (normal)   Extremities: Edema of bilateral lower extremities from the knee down involving the feet and toes.  Some pigmentation changes of medial lower legs however no open wounds.  No notable varicose veins.  No drainage. Musculoskeletal: no muscle wasting or atrophy  Neurologic: A&O X 3;  No focal weakness or paresthesias are detected Psychiatric:  The pt has Normal affect.   ASSESSMENT/PLAN:: 75 y.o. male here for follow up for evaluation of lymphedema involving bilateral lower extremities  -Bilateral lower extremities are well perfused on exam with palpable pedal pulses -Edema bilateral lower extremities involving the foot and toes extending to the  level of the proximal shin is consistent with lymphedema -Encouraged the patient to continue regularly use of knee-high compression  stockings, elevation of his legs above his heart periodically throughout the day, and regular exercise -In my opinion, patient would benefit from regular use of lymphedema pumps in addition to the above conservative management.  For this reason we will begin the process to obtain insurance approval -Patient will call/return to office if lymphedema worsens or if any open wounds develop   Dagoberto Ligas, PA-C [facility] [phone]  Clinic MD:   [place]

## 2020-10-22 ENCOUNTER — Telehealth: Payer: Self-pay | Admitting: Cardiology

## 2020-10-22 DIAGNOSIS — I4891 Unspecified atrial fibrillation: Secondary | ICD-10-CM | POA: Diagnosis not present

## 2020-10-22 DIAGNOSIS — U071 COVID-19: Secondary | ICD-10-CM | POA: Diagnosis not present

## 2020-10-22 NOTE — Telephone Encounter (Addendum)
Spoke to patient  Joseph Smith advised to decrease Eliquis to 2.5 mg twice a day while on Paxlovid,return to normal dose 5 mg twice a day when finished.Ok to continue all other medications.

## 2020-10-22 NOTE — Telephone Encounter (Signed)
New message:     Dr. Owens Shark calling from the [facility] stating that the patient has covid. Patient is out of rhythm and in AFIB with covid.

## 2020-10-22 NOTE — Telephone Encounter (Signed)
He is on Eliquis. As long as Afib rate is controlled then will just need follow up after Covid resolved.    Joseph Martinique MD, [place] NP at [facility]. She said that she told patient to go to ED to be treated for uncontrolled A. FIB and COVID. She stated that patient cannot take the antiviral medication (paxlovid) being on eliquis. She told patient if he was not going to go to the ED to follow up with cardiologist to treat him.    Called patient. Patient stated someone from our office is suppose to be calling him back about the Paxlovid and Eliquis. Patient stated he feel fine. Patient took his pulse manually and it was 64. Patient would like Dr. Stanford Breed to advise. Will send message to Dr. Stanford Breed.

## 2020-10-25 ENCOUNTER — Telehealth: Payer: Self-pay

## 2020-10-25 NOTE — Telephone Encounter (Signed)
Patient was seen in office on 8/24 and calls today to report that he bumped his ankle on his right leg and the swelling is no longer getting better. It is not getting worse, just the same. Says his left leg is "like it was when I was 21." It is a little tender at his ankle and he has concerns about a blood clot. Advised that he was taking Eliquis, and that would be the treatment if he did have one. He has an appt scheduled with Elwyn Lade for his lymphedema pump. Advised patient to continue elevation and compression. Instructed to call back if further concerns. Patient verbalized understanding.

## 2020-11-01 DIAGNOSIS — M25671 Stiffness of right ankle, not elsewhere classified: Secondary | ICD-10-CM | POA: Diagnosis not present

## 2020-11-01 DIAGNOSIS — I890 Lymphedema, not elsewhere classified: Secondary | ICD-10-CM | POA: Diagnosis not present

## 2020-11-01 DIAGNOSIS — R29898 Other symptoms and signs involving the musculoskeletal system: Secondary | ICD-10-CM | POA: Diagnosis not present

## 2020-11-14 DIAGNOSIS — M25671 Stiffness of right ankle, not elsewhere classified: Secondary | ICD-10-CM | POA: Diagnosis not present

## 2020-11-14 DIAGNOSIS — I890 Lymphedema, not elsewhere classified: Secondary | ICD-10-CM | POA: Diagnosis not present

## 2020-11-14 DIAGNOSIS — R29898 Other symptoms and signs involving the musculoskeletal system: Secondary | ICD-10-CM | POA: Diagnosis not present

## 2020-11-15 DIAGNOSIS — M25671 Stiffness of right ankle, not elsewhere classified: Secondary | ICD-10-CM | POA: Diagnosis not present

## 2020-11-15 DIAGNOSIS — I890 Lymphedema, not elsewhere classified: Secondary | ICD-10-CM | POA: Diagnosis not present

## 2020-11-15 DIAGNOSIS — R29898 Other symptoms and signs involving the musculoskeletal system: Secondary | ICD-10-CM | POA: Diagnosis not present

## 2020-12-06 DIAGNOSIS — R29898 Other symptoms and signs involving the musculoskeletal system: Secondary | ICD-10-CM | POA: Diagnosis not present

## 2020-12-06 DIAGNOSIS — M25671 Stiffness of right ankle, not elsewhere classified: Secondary | ICD-10-CM | POA: Diagnosis not present

## 2020-12-06 DIAGNOSIS — I890 Lymphedema, not elsewhere classified: Secondary | ICD-10-CM | POA: Diagnosis not present

## 2020-12-11 DIAGNOSIS — I890 Lymphedema, not elsewhere classified: Secondary | ICD-10-CM | POA: Diagnosis not present

## 2020-12-11 DIAGNOSIS — R29898 Other symptoms and signs involving the musculoskeletal system: Secondary | ICD-10-CM | POA: Diagnosis not present

## 2020-12-11 DIAGNOSIS — M25671 Stiffness of right ankle, not elsewhere classified: Secondary | ICD-10-CM | POA: Diagnosis not present

## 2021-01-01 DIAGNOSIS — I890 Lymphedema, not elsewhere classified: Secondary | ICD-10-CM | POA: Diagnosis not present

## 2021-01-01 DIAGNOSIS — M25671 Stiffness of right ankle, not elsewhere classified: Secondary | ICD-10-CM | POA: Diagnosis not present

## 2021-01-01 DIAGNOSIS — R29898 Other symptoms and signs involving the musculoskeletal system: Secondary | ICD-10-CM | POA: Diagnosis not present

## 2021-01-08 DIAGNOSIS — R29898 Other symptoms and signs involving the musculoskeletal system: Secondary | ICD-10-CM | POA: Diagnosis not present

## 2021-01-08 DIAGNOSIS — M25671 Stiffness of right ankle, not elsewhere classified: Secondary | ICD-10-CM | POA: Diagnosis not present

## 2021-01-08 DIAGNOSIS — I890 Lymphedema, not elsewhere classified: Secondary | ICD-10-CM | POA: Diagnosis not present

## 2021-02-02 ENCOUNTER — Other Ambulatory Visit: Payer: Self-pay | Admitting: Family Medicine

## 2021-02-02 DIAGNOSIS — I10 Essential (primary) hypertension: Secondary | ICD-10-CM

## 2021-02-11 ENCOUNTER — Other Ambulatory Visit: Payer: Self-pay | Admitting: Family Medicine

## 2021-02-11 DIAGNOSIS — I10 Essential (primary) hypertension: Secondary | ICD-10-CM

## 2021-02-18 ENCOUNTER — Other Ambulatory Visit: Payer: Self-pay | Admitting: Family Medicine

## 2021-02-18 DIAGNOSIS — I10 Essential (primary) hypertension: Secondary | ICD-10-CM

## 2021-02-21 ENCOUNTER — Ambulatory Visit: Payer: Medicare Other

## 2021-03-09 ENCOUNTER — Other Ambulatory Visit: Payer: Self-pay | Admitting: Family Medicine

## 2021-03-09 DIAGNOSIS — K219 Gastro-esophageal reflux disease without esophagitis: Secondary | ICD-10-CM

## 2021-04-08 ENCOUNTER — Other Ambulatory Visit: Payer: Self-pay | Admitting: Family Medicine

## 2021-04-08 DIAGNOSIS — E785 Hyperlipidemia, unspecified: Secondary | ICD-10-CM

## 2021-04-16 ENCOUNTER — Other Ambulatory Visit: Payer: Self-pay | Admitting: Student

## 2021-04-16 DIAGNOSIS — I480 Paroxysmal atrial fibrillation: Secondary | ICD-10-CM

## 2021-04-16 NOTE — Telephone Encounter (Signed)
Prescription fill request for Eliquis received. Indication: afib  Last office visit: Sarajane Jews, 04/04/2020 Scr: 1.41, 08/28/2020 Age: 76  Weight: 100.4 kg   Pt needs to see cardiologist for future refills. Will send msg to schedulers to reach out to pt to make an appointment.

## 2021-05-08 DIAGNOSIS — H40013 Open angle with borderline findings, low risk, bilateral: Secondary | ICD-10-CM | POA: Diagnosis not present

## 2021-05-22 ENCOUNTER — Other Ambulatory Visit: Payer: Self-pay

## 2021-05-22 MED ORDER — METOPROLOL SUCCINATE ER 25 MG PO TB24: ORAL_TABLET | ORAL | 1 refills | Status: DC

## 2021-05-29 NOTE — Progress Notes (Deleted)
HPI: FU atrial fibrillation. Nuclear study in February of 2011 showed an ejection fraction of 62% and normal perfusion. Patient had a monitor in February of 2012 that showed documentation of atrial fibrillation. Treated with beta blocker and apixaban. Abdominal ultrasound October 2014 showed no aneurysm.  Echo April 2021 showed normal LV function, mildly dilated ascending aorta at 40 mm.  Monitor April 2021 showed occasional PAC, PVC, brief PAT and 4 beats nonsustained ventricular tachycardia.  Patient seen recently in the office with increasing episodes that were symptomatic.  Beta-blocker increased.  Since he was last seen,   Current Outpatient Medications  Medication Sig Dispense Refill   amLODipine (NORVASC) 10 MG tablet TAKE 1 TABLET(10 MG) BY MOUTH DAILY 90 tablet 1   apixaban (ELIQUIS) 5 MG TABS tablet Take 1 tablet (5 mg total) by mouth 2 (two) times daily. Please call heartcare to make appointment to see cardiologist. 60 tablet 0   benazepril (LOTENSIN) 20 MG tablet TAKE 1 TABLET(20 MG) BY MOUTH DAILY 90 tablet 1   HUMIRA PEN 40 MG/0.4ML PNKT SMARTSIG:40 Milligram(s) SUB-Q Every 2 Weeks (Patient not taking: No sig reported)     hydrochlorothiazide (MICROZIDE) 12.5 MG capsule TAKE 1 CAPSULE(12.5 MG) BY MOUTH DAILY 90 capsule 1   LORazepam (ATIVAN) 0.5 MG tablet Take 0.5 mg by mouth at bedtime as needed. (Patient not taking: No sig reported)     metoprolol succinate (TOPROL-XL) 25 MG 24 hr tablet TAKE 3 TABLETS(75 MG) BY MOUTH DAILY 270 tablet 1   omeprazole (PRILOSEC) 40 MG capsule TAKE 1 CAPSULE BY MOUTH EVERY DAY 90 capsule 1   pravastatin (PRAVACHOL) 40 MG tablet TAKE 1 TABLET(40 MG) BY MOUTH DAILY 90 tablet 0   triamcinolone cream (KENALOG) 0.1 % Apply topically 2 (two) times daily as needed. 80 g 1   No current facility-administered medications for this visit.     Past Medical History:  Diagnosis Date   Atrial fibrillation (HCC)    Chest pain, unspecified    Esophageal  reflux    Family history of ischemic heart disease    Other and unspecified hyperlipidemia    Palpitations    Ulcerative colitis    Unspecified essential hypertension     Past Surgical History:  Procedure Laterality Date   ELBOW SURGERY     left   KNEE ARTHROSCOPY     rihgt   VASECTOMY      Social History   Socioeconomic History   Marital status: Single    Spouse name: Not on file   Number of children: Not on file   Years of education: Not on file   Highest education level: Not on file  Occupational History   Not on file  Tobacco Use   Smoking status: Former    Years: 25.00    Types: Cigarettes, Cigars   Smokeless tobacco: Never  Substance and Sexual Activity   Alcohol use: Yes    Alcohol/week: 0.0 standard drinks    Comment: pt has about 10 drinks or varying type on average throughout one week    Drug use: No   Sexual activity: Yes  Other Topics Concern   Not on file  Social History Narrative   Married. Education: [employer]. Exercise: Weekly.   Social Determinants of Health   Financial Resource Strain: Not on file  Food Insecurity: Not on file  Transportation Needs: Not on file  Physical Activity: Not on file  Stress: Not on file  Social Connections: Not on file  Intimate Partner Violence: Not on file    Family History  Problem Relation Age of Onset   Stroke Brother    Coronary artery disease Other        family history    ROS: no fevers or chills, productive cough, hemoptysis, dysphasia, odynophagia, melena, hematochezia, dysuria, hematuria, rash, seizure activity, orthopnea, PND, pedal edema, claudication. Remaining systems are negative.  Physical Exam: Well-developed well-nourished in no acute distress.  Skin is warm and dry.  HEENT is normal.  Neck is supple.  Chest is clear to auscultation with normal expansion.  Cardiovascular exam is regular rate and rhythm.  Abdominal exam nontender or distended. No masses palpated. Extremities show no  edema. neuro grossly intact  ECG- personally reviewed  A/P  1 paroxysmal atrial fibrillation-patient remains in sinus rhythm.  We will continue beta-blocker and apixaban.  2 hypertension-blood pressure controlled.  Continue present medical regimen.  3 hyperlipidemia-continue statin.  4 lower extremity edema-  Kirk Ruths, MD

## 2021-06-12 ENCOUNTER — Ambulatory Visit: Payer: BC Managed Care – PPO | Admitting: Cardiology

## 2021-07-04 ENCOUNTER — Other Ambulatory Visit: Payer: Self-pay

## 2021-07-04 DIAGNOSIS — I480 Paroxysmal atrial fibrillation: Secondary | ICD-10-CM

## 2021-07-04 MED ORDER — APIXABAN 5 MG PO TABS: ORAL_TABLET | ORAL | 0 refills | Status: DC

## 2021-07-04 NOTE — Telephone Encounter (Signed)
Prescription refill request for Eliquis received. ?Indication:Afib ?Last office visit:upcoming ?Scr:1.4 ?Age: 76 ?Weight:100.4 kg ? ?Prescription refilled ? ?

## 2021-07-06 ENCOUNTER — Other Ambulatory Visit: Payer: Self-pay

## 2021-07-06 DIAGNOSIS — I480 Paroxysmal atrial fibrillation: Secondary | ICD-10-CM

## 2021-07-08 ENCOUNTER — Other Ambulatory Visit: Payer: Self-pay

## 2021-07-08 DIAGNOSIS — I480 Paroxysmal atrial fibrillation: Secondary | ICD-10-CM

## 2021-07-11 ENCOUNTER — Other Ambulatory Visit: Payer: Self-pay | Admitting: Family Medicine

## 2021-07-11 DIAGNOSIS — E785 Hyperlipidemia, unspecified: Secondary | ICD-10-CM

## 2021-07-24 ENCOUNTER — Telehealth: Payer: Self-pay

## 2021-07-24 NOTE — Telephone Encounter (Signed)
ERROR the PA(per cover my meds): An active PA is already on file with expiration date of 07/09/2022. Please wait to resubmit request within 60 days of that expiration date to obtain a PA renewal. Spoke with Diona she states that she does not know why we received a prior auth request. This is just a refill too soon. Pt can fill 08-05-2020. Will call pt and notify and make sure he knows/has enough until then. Tried to call pt, left message to call back regarding Eliquis.

## 2021-07-24 NOTE — Telephone Encounter (Signed)
Received a Prior Auth request in Cover my meds for Eliquis 72m tab. Entered PA and received message that this pt already had approved PA for Eliquis that is good until 08-09-2022. Tried to call pharmacy and they are closed until 9am will call back then.

## 2021-07-25 NOTE — Telephone Encounter (Signed)
Patient was returning call to the office

## 2021-07-25 NOTE — Telephone Encounter (Signed)
Returned call to pt he states that he does have enough medication to last until 08-05-21

## 2021-08-11 ENCOUNTER — Other Ambulatory Visit: Payer: Self-pay | Admitting: Family Medicine

## 2021-08-11 DIAGNOSIS — I10 Essential (primary) hypertension: Secondary | ICD-10-CM

## 2021-08-13 NOTE — Progress Notes (Signed)
HPI: FU atrial fibrillation. Nuclear study in February of 2011 showed an ejection fraction of 62% and normal perfusion. Patient had a monitor in February of 2012 that showed documentation of atrial fibrillation. Treated with beta blocker and apixaban. Abdominal ultrasound October 2014 showed no aneurysm.  Echo April 2021 showed normal LV function, mildly dilated ascending aorta at 40 mm.  Monitor April 2021 showed occasional PAC, PVC, brief PAT and 4 beats nonsustained ventricular tachycardia.  Since he was last seen, he denies dyspnea, chest pain, palpitations or syncope.  He has had some bruising on his arms with the Eliquis and is only taking this once daily.  Current Outpatient Medications  Medication Sig Dispense Refill   amLODipine (NORVASC) 10 MG tablet TAKE 1 TABLET(10 MG) BY MOUTH DAILY 90 tablet 1   apixaban (ELIQUIS) 5 MG TABS tablet Take 1 tablet (5 mg) Two Times Daily. 180 tablet 0   benazepril (LOTENSIN) 20 MG tablet TAKE 1 TABLET(20 MG) BY MOUTH DAILY 90 tablet 1   LORazepam (ATIVAN) 0.5 MG tablet Take 0.5 mg by mouth at bedtime as needed.     metoprolol succinate (TOPROL-XL) 25 MG 24 hr tablet TAKE 3 TABLETS(75 MG) BY MOUTH DAILY 270 tablet 1   omeprazole (PRILOSEC) 40 MG capsule TAKE 1 CAPSULE BY MOUTH EVERY DAY 90 capsule 1   pravastatin (PRAVACHOL) 40 MG tablet TAKE 1 TABLET(40 MG) BY MOUTH DAILY 90 tablet 0   triamcinolone cream (KENALOG) 0.1 % Apply topically 2 (two) times daily as needed. 80 g 1   No current facility-administered medications for this visit.     Past Medical History:  Diagnosis Date   Atrial fibrillation (HCC)    Chest pain, unspecified    Esophageal reflux    Family history of ischemic heart disease    Other and unspecified hyperlipidemia    Palpitations    Ulcerative colitis    Unspecified essential hypertension     Past Surgical History:  Procedure Laterality Date   ELBOW SURGERY     left   KNEE ARTHROSCOPY     rihgt   VASECTOMY       Social History   Socioeconomic History   Marital status: Single    Spouse name: Not on file   Number of children: Not on file   Years of education: Not on file   Highest education level: Not on file  Occupational History   Not on file  Tobacco Use   Smoking status: Former    Years: 25.00    Types: Cigarettes, Cigars   Smokeless tobacco: Never  Substance and Sexual Activity   Alcohol use: Yes    Alcohol/week: 0.0 standard drinks of alcohol    Comment: pt has about 10 drinks or varying type on average throughout one week    Drug use: No   Sexual activity: Yes  Other Topics Concern   Not on file  Social History Narrative   Married. Education: [employer]. Exercise: Weekly.   Social Determinants of Health   Financial Resource Strain: Not on file  Food Insecurity: Not on file  Transportation Needs: Not on file  Physical Activity: Not on file  Stress: Not on file  Social Connections: Not on file  Intimate Partner Violence: Not on file    Family History  Problem Relation Age of Onset   Stroke Brother    Coronary artery disease Other        family history    ROS: no fevers or  chills, productive cough, hemoptysis, dysphasia, odynophagia, melena, hematochezia, dysuria, hematuria, rash, seizure activity, orthopnea, PND, pedal edema, claudication. Remaining systems are negative.  Physical Exam: Well-developed well-nourished in no acute distress.  Skin is warm and dry.  HEENT is normal.  Neck is supple.  Chest is clear to auscultation with normal expansion.  Cardiovascular exam is regular rate and rhythm.  Abdominal exam nontender or distended. No masses palpated. Extremities show no edema. neuro grossly intact  ECG-sinus bradycardia at a rate of 55, no ST changes.  Personally reviewed  A/P  1 paroxysmal atrial fibrillation-patient remains in sinus rhythm.  We will continue beta-blocker and apixaban.  If he has more frequent episodes in the future we will consider  addition of antiarrhythmic versus referral for ablation.  Note he is complaining of bruising in his arms and is only taking Eliquis once daily.  We will increase back to standard dosing of twice daily.  If this becomes a significant issue we will consider referral for watchman.  Check hemoglobin.  2 hypertension-blood pressure borderline; his blood pressure however at home is controlled.  We will continue present medications and follow.  Check potassium and renal function.  Check lipids and liver.  3 hyperlipidemia-continue statin.  Kirk Ruths, MD

## 2021-08-21 ENCOUNTER — Ambulatory Visit (INDEPENDENT_AMBULATORY_CARE_PROVIDER_SITE_OTHER): Payer: BC Managed Care – PPO | Admitting: Cardiology

## 2021-08-21 ENCOUNTER — Encounter: Payer: Self-pay | Admitting: Cardiology

## 2021-08-21 VITALS — BP 144/80 | HR 55 | Ht 73.0 in | Wt 224.0 lb

## 2021-08-21 DIAGNOSIS — E785 Hyperlipidemia, unspecified: Secondary | ICD-10-CM

## 2021-08-21 DIAGNOSIS — I480 Paroxysmal atrial fibrillation: Secondary | ICD-10-CM | POA: Diagnosis not present

## 2021-08-21 DIAGNOSIS — I10 Essential (primary) hypertension: Secondary | ICD-10-CM | POA: Diagnosis not present

## 2021-08-21 NOTE — Patient Instructions (Signed)

## 2021-08-22 DIAGNOSIS — E785 Hyperlipidemia, unspecified: Secondary | ICD-10-CM | POA: Diagnosis not present

## 2021-08-22 DIAGNOSIS — I480 Paroxysmal atrial fibrillation: Secondary | ICD-10-CM | POA: Diagnosis not present

## 2021-08-23 ENCOUNTER — Other Ambulatory Visit: Payer: Self-pay

## 2021-08-23 DIAGNOSIS — Z79899 Other long term (current) drug therapy: Secondary | ICD-10-CM

## 2021-08-23 LAB — BASIC METABOLIC PANEL
BUN/Creatinine Ratio: 20 (calc) (ref 6–22)
BUN: 26 mg/dL — ABNORMAL HIGH (ref 7–25)
CO2: 22 mmol/L (ref 20–32)
Calcium: 9.4 mg/dL (ref 8.6–10.3)
Chloride: 103 mmol/L (ref 98–110)
Creat: 1.33 mg/dL — ABNORMAL HIGH (ref 0.70–1.28)
Glucose, Bld: 106 mg/dL (ref 65–139)
Potassium: 4.8 mmol/L (ref 3.5–5.3)
Sodium: 136 mmol/L (ref 135–146)

## 2021-08-23 LAB — HEPATIC FUNCTION PANEL
AG Ratio: 1.5 (calc) (ref 1.0–2.5)
ALT: 11 U/L (ref 9–46)
AST: 16 U/L (ref 10–35)
Albumin: 4.3 g/dL (ref 3.6–5.1)
Alkaline phosphatase (APISO): 51 U/L (ref 35–144)
Bilirubin, Direct: 0.1 mg/dL (ref 0.0–0.2)
Globulin: 2.9 g/dL (calc) (ref 1.9–3.7)
Indirect Bilirubin: 0.2 mg/dL (calc) (ref 0.2–1.2)
Total Bilirubin: 0.3 mg/dL (ref 0.2–1.2)
Total Protein: 7.2 g/dL (ref 6.1–8.1)

## 2021-08-23 LAB — CBC
HCT: 31.1 % — ABNORMAL LOW (ref 38.5–50.0)
Hemoglobin: 9.6 g/dL — ABNORMAL LOW (ref 13.2–17.1)
MCH: 24.3 pg — ABNORMAL LOW (ref 27.0–33.0)
MCHC: 30.9 g/dL — ABNORMAL LOW (ref 32.0–36.0)
MCV: 78.7 fL — ABNORMAL LOW (ref 80.0–100.0)
MPV: 10.2 fL (ref 7.5–12.5)
Platelets: 339 10*3/uL (ref 140–400)
RBC: 3.95 10*6/uL — ABNORMAL LOW (ref 4.20–5.80)
RDW: 16.2 % — ABNORMAL HIGH (ref 11.0–15.0)
WBC: 6.6 10*3/uL (ref 3.8–10.8)

## 2021-08-30 ENCOUNTER — Other Ambulatory Visit: Payer: Self-pay | Admitting: Family Medicine

## 2021-08-30 DIAGNOSIS — K219 Gastro-esophageal reflux disease without esophagitis: Secondary | ICD-10-CM

## 2021-09-19 ENCOUNTER — Telehealth: Payer: Self-pay | Admitting: Cardiology

## 2021-09-19 ENCOUNTER — Other Ambulatory Visit: Payer: Self-pay | Admitting: *Deleted

## 2021-09-19 DIAGNOSIS — Z79899 Other long term (current) drug therapy: Secondary | ICD-10-CM

## 2021-09-19 DIAGNOSIS — I10 Essential (primary) hypertension: Secondary | ICD-10-CM

## 2021-09-19 NOTE — Telephone Encounter (Signed)
Returned call to patient, patient states he is due for repeat lab work and request this be done at [facility] lab.  Lab order placed for [facility].  Patient aware and verbalized understanding.

## 2021-09-19 NOTE — Telephone Encounter (Signed)
Patient calling in to get blood order release to [facility]. Please advise

## 2021-09-20 DIAGNOSIS — I10 Essential (primary) hypertension: Secondary | ICD-10-CM | POA: Diagnosis not present

## 2021-09-20 DIAGNOSIS — Z79899 Other long term (current) drug therapy: Secondary | ICD-10-CM | POA: Diagnosis not present

## 2021-09-21 LAB — BASIC METABOLIC PANEL
BUN: 24 mg/dL (ref 7–25)
CO2: 23 mmol/L (ref 20–32)
Calcium: 9.4 mg/dL (ref 8.6–10.3)
Chloride: 103 mmol/L (ref 98–110)
Creat: 1.16 mg/dL (ref 0.70–1.28)
Glucose, Bld: 125 mg/dL — ABNORMAL HIGH (ref 65–99)
Potassium: 4.6 mmol/L (ref 3.5–5.3)
Sodium: 136 mmol/L (ref 135–146)

## 2021-09-23 ENCOUNTER — Encounter: Payer: Self-pay | Admitting: *Deleted

## 2021-10-11 ENCOUNTER — Other Ambulatory Visit: Payer: Self-pay | Admitting: Family Medicine

## 2021-10-11 DIAGNOSIS — I10 Essential (primary) hypertension: Secondary | ICD-10-CM

## 2021-11-19 ENCOUNTER — Telehealth: Payer: Self-pay | Admitting: Cardiology

## 2021-11-19 ENCOUNTER — Other Ambulatory Visit: Payer: Self-pay

## 2021-11-19 MED ORDER — METOPROLOL SUCCINATE ER 25 MG PO TB24: ORAL_TABLET | ORAL | 2 refills | Status: DC

## 2021-11-19 NOTE — Telephone Encounter (Signed)
*  STAT* If patient is at the pharmacy, call can be transferred to refill team.   1. Which medications need to be refilled? (please list name of each medication and dose if known)   metoprolol succinate (TOPROL-XL) 25 MG 24 hr tablet    2. Which pharmacy/location (including street and city if local pharmacy) is medication to be sent to? [place], [place] [address]  3. Do they need a 30 day or 90 day supply?  90 day

## 2021-12-05 ENCOUNTER — Other Ambulatory Visit: Payer: Self-pay

## 2021-12-05 DIAGNOSIS — I480 Paroxysmal atrial fibrillation: Secondary | ICD-10-CM

## 2021-12-05 MED ORDER — APIXABAN 5 MG PO TABS: ORAL_TABLET | ORAL | 1 refills | Status: DC

## 2021-12-05 NOTE — Telephone Encounter (Signed)
Prescription refill request for Eliquis received. Indication: Afib  Last office visit: 08/23/21 Stanford Breed)  Scr: 1.16 (09/20/21) Age: 76 Weight: 101.6kg  Appropriate dose and refill sent to requested pharmacy.

## 2021-12-16 DIAGNOSIS — K51211 Ulcerative (chronic) proctitis with rectal bleeding: Secondary | ICD-10-CM | POA: Diagnosis not present

## 2021-12-23 DIAGNOSIS — K51211 Ulcerative (chronic) proctitis with rectal bleeding: Secondary | ICD-10-CM | POA: Diagnosis not present

## 2022-01-05 ENCOUNTER — Other Ambulatory Visit: Payer: Self-pay | Admitting: Family Medicine

## 2022-01-05 DIAGNOSIS — E785 Hyperlipidemia, unspecified: Secondary | ICD-10-CM

## 2022-01-06 ENCOUNTER — Other Ambulatory Visit: Payer: Self-pay | Admitting: Family Medicine

## 2022-01-06 ENCOUNTER — Other Ambulatory Visit: Payer: Self-pay

## 2022-01-06 DIAGNOSIS — E785 Hyperlipidemia, unspecified: Secondary | ICD-10-CM

## 2022-01-06 MED ORDER — PRAVASTATIN SODIUM 40 MG PO TABS: ORAL_TABLET | ORAL | 0 refills | Status: DC

## 2022-01-27 ENCOUNTER — Other Ambulatory Visit: Payer: Self-pay | Admitting: Family Medicine

## 2022-01-27 DIAGNOSIS — I10 Essential (primary) hypertension: Secondary | ICD-10-CM

## 2022-01-28 ENCOUNTER — Other Ambulatory Visit: Payer: Self-pay

## 2022-01-28 ENCOUNTER — Other Ambulatory Visit: Payer: Self-pay | Admitting: Family Medicine

## 2022-01-28 DIAGNOSIS — I10 Essential (primary) hypertension: Secondary | ICD-10-CM

## 2022-01-28 DIAGNOSIS — K51211 Ulcerative (chronic) proctitis with rectal bleeding: Secondary | ICD-10-CM | POA: Diagnosis not present

## 2022-01-28 MED ORDER — AMLODIPINE BESYLATE 10 MG PO TABS: ORAL_TABLET | ORAL | 0 refills | Status: DC

## 2022-01-29 ENCOUNTER — Telehealth: Payer: Self-pay

## 2022-01-29 DIAGNOSIS — I700 Atherosclerosis of aorta: Secondary | ICD-10-CM | POA: Insufficient documentation

## 2022-01-29 NOTE — Telephone Encounter (Signed)
Patient with diagnosis of afib on Eliquis for anticoagulation.    Procedure: colonoscopy and endoscopy Date of procedure: 02/04/22  CHA2DS2-VASc Score = 4  This indicates a 4.8% annual risk of stroke. The patient's score is based upon: CHF History: 0 HTN History: 1 Diabetes History: 0 Stroke History: 0 Vascular Disease History: 1 Age Score: 2 Gender Score: 0  Aortic atherosclerosis noted on abdominal CT 01/2020, PMH updated  CrCl 52m/min using adjusted body weight Platelet count 339K  Per office protocol, patient can hold Eliquis for 2 days prior to procedure as requested. Pt does not require antibiotics beforehand.  **This guidance is not considered finalized until pre-operative APP has relayed final recommendations.**

## 2022-01-29 NOTE — Telephone Encounter (Signed)
   Pre-operative Risk Assessment    Patient Name: Joseph Smith  DOB: 25-Jun-1945 MRN: [id number]      Request for Surgical Clearance    Procedure:   Colonoscopy and/or Endoscopy  Date of Surgery:  Clearance 02/04/22                                 Surgeon:  Zane Herald Surgeon's Group or Practice Name:  [facility] Phone number:  [phone]  Fax number:  [phone]   Type of Clearance Requested:   - Pharmacy:  Hold Apixaban (Eliquis) 2   Type of Anesthesia:  Not Indicated   Additional requests/questions:  Does this patient need antibiotics?  Signed, Elsie Lincoln Shaka Cardin   01/29/2022, 2:46 PM

## 2022-01-29 NOTE — Telephone Encounter (Signed)
   Patient Name: Joseph Smith  DOB: 05/22/1945 MRN: [id number]  Primary Cardiologist: Kirk Ruths, MD  Clinical pharmacists have reviewed the patient's past medical history, labs, and current medications as part of preoperative protocol coverage. The following recommendations have been made:   Patient with diagnosis of afib on Eliquis for anticoagulation.     Procedure: colonoscopy and endoscopy Date of procedure: 02/04/22   CHA2DS2-VASc Score = 4  This indicates a 4.8% annual risk of stroke. The patient's score is based upon: CHF History: 0 HTN History: 1 Diabetes History: 0 Stroke History: 0 Vascular Disease History: 1 Age Score: 2 Gender Score: 0   Aortic atherosclerosis noted on abdominal CT 01/2020, PMH updated   CrCl 45m/min using adjusted body weight Platelet count 339K   Per office protocol, patient can hold Eliquis for 2 days prior to procedure as requested. Pt does not require antibiotics beforehand.  I will route this recommendation to the requesting party via Epic fax function and remove from pre-op pool.  Please call with questions.  ELenna Sciara NP 01/29/2022, 3:47 PM

## 2022-01-31 ENCOUNTER — Other Ambulatory Visit: Payer: Self-pay | Admitting: Family Medicine

## 2022-01-31 DIAGNOSIS — K219 Gastro-esophageal reflux disease without esophagitis: Secondary | ICD-10-CM

## 2022-02-02 ENCOUNTER — Other Ambulatory Visit: Payer: Self-pay | Admitting: Family Medicine

## 2022-02-02 DIAGNOSIS — E785 Hyperlipidemia, unspecified: Secondary | ICD-10-CM

## 2022-02-04 DIAGNOSIS — Z538 Procedure and treatment not carried out for other reasons: Secondary | ICD-10-CM | POA: Diagnosis not present

## 2022-02-04 DIAGNOSIS — K529 Noninfective gastroenteritis and colitis, unspecified: Secondary | ICD-10-CM | POA: Diagnosis not present

## 2022-02-04 DIAGNOSIS — K5730 Diverticulosis of large intestine without perforation or abscess without bleeding: Secondary | ICD-10-CM | POA: Diagnosis not present

## 2022-02-04 DIAGNOSIS — K921 Melena: Secondary | ICD-10-CM | POA: Diagnosis not present

## 2022-02-04 DIAGNOSIS — E7800 Pure hypercholesterolemia, unspecified: Secondary | ICD-10-CM | POA: Diagnosis not present

## 2022-02-06 ENCOUNTER — Other Ambulatory Visit: Payer: Self-pay | Admitting: Family Medicine

## 2022-02-06 DIAGNOSIS — I10 Essential (primary) hypertension: Secondary | ICD-10-CM

## 2022-02-10 ENCOUNTER — Other Ambulatory Visit: Payer: Self-pay | Admitting: Family Medicine

## 2022-02-10 DIAGNOSIS — I10 Essential (primary) hypertension: Secondary | ICD-10-CM

## 2022-02-12 ENCOUNTER — Encounter: Payer: Self-pay | Admitting: Family Medicine

## 2022-02-12 ENCOUNTER — Ambulatory Visit (INDEPENDENT_AMBULATORY_CARE_PROVIDER_SITE_OTHER): Payer: BC Managed Care – PPO | Admitting: Family Medicine

## 2022-02-12 VITALS — BP 130/70 | HR 54 | Temp 98.3°F | Ht 73.0 in | Wt 222.4 lb

## 2022-02-12 DIAGNOSIS — I10 Essential (primary) hypertension: Secondary | ICD-10-CM

## 2022-02-12 DIAGNOSIS — I480 Paroxysmal atrial fibrillation: Secondary | ICD-10-CM

## 2022-02-12 DIAGNOSIS — F439 Reaction to severe stress, unspecified: Secondary | ICD-10-CM

## 2022-02-12 DIAGNOSIS — K219 Gastro-esophageal reflux disease without esophagitis: Secondary | ICD-10-CM | POA: Diagnosis not present

## 2022-02-12 DIAGNOSIS — E785 Hyperlipidemia, unspecified: Secondary | ICD-10-CM | POA: Diagnosis not present

## 2022-02-12 DIAGNOSIS — L209 Atopic dermatitis, unspecified: Secondary | ICD-10-CM | POA: Diagnosis not present

## 2022-02-12 MED ORDER — LORAZEPAM 0.5 MG PO TABS: 0.2500 mg | ORAL_TABLET | Freq: Three times a day (TID) | ORAL | 0 refills | Status: DC | PRN

## 2022-02-12 MED ORDER — OMEPRAZOLE 40 MG PO CPDR: 40.0000 mg | DELAYED_RELEASE_CAPSULE | Freq: Every day | ORAL | 3 refills | Status: AC

## 2022-02-12 MED ORDER — TRIAMCINOLONE ACETONIDE 0.1 % EX CREA: TOPICAL_CREAM | Freq: Two times a day (BID) | CUTANEOUS | 1 refills | Status: AC | PRN

## 2022-02-12 MED ORDER — BENAZEPRIL HCL 20 MG PO TABS: ORAL_TABLET | ORAL | 3 refills | Status: DC

## 2022-02-12 MED ORDER — AMLODIPINE BESYLATE 10 MG PO TABS: ORAL_TABLET | ORAL | 3 refills | Status: DC

## 2022-02-12 MED ORDER — PRAVASTATIN SODIUM 40 MG PO TABS: ORAL_TABLET | ORAL | 3 refills | Status: DC

## 2022-02-12 NOTE — Progress Notes (Signed)
Subjective:  Patient ID: Joseph Smith, male    DOB: May 11, 1945  Age: 76 y.o. MRN: [id number]  CC:  Chief Complaint  Patient presents with   Hyperlipidemia    Pt states all is well    HPI Joseph Smith presents for med review, last visit with me was June 2022 Lost mother-in-law yesterday d/t glioblastoma.   Still followed by GI, ulcerative proctitis, Dr. Glennon Hamilton. On mesalamine now.  Infrequent use of lorazepam when stressed - up to every few weeks or maybe once a month. Not daily. Would like to have as option. No side effects and feels well with only 1/2 pill if he does take it. Denies insomnia. Last filled in 2021.   Rash of ankle Intermittent rash of ankle, usually in winter.  Slight itch, bumps. Improves with triamcinolone rare use - needs rf.   GERD: Stable with PPI, omeprazole daily.   Hyperlipidemia: Pravastatin 30m qd. No new myalgias/side effects.  Last ate about 4 hrs. Lab Results  Component Value Date   CHOL 195 05/25/2020   HDL 89 05/25/2020   LDLCALC 93 05/25/2020   TRIG 70 05/25/2020   CHOLHDL 2.2 05/25/2020   Lab Results  Component Value Date   ALT 11 08/22/2021   AST 16 08/22/2021   ALKPHOS 41 08/28/2020   BILITOT 0.3 08/22/2021    Hypertension: With paroxysmal atrial fibrillation followed by cardiology, Dr. CStanford Breed  Last office visit June 28.  Beta-blocker and apixaban.  Concern for bruising previously, option of referral for watchman if this was persistent issue. Norvasc 130m lotensin 2070mtoprol 7m29m.  No new bleeding. Stable. No CP/dyspnea, new sx's.  Home readings: none.  No new med side effects.  BP Readings from Last 3 Encounters:  02/12/22 130/70  08/21/21 (!) 144/80  10/17/20 130/76   Lab Results  Component Value Date   CREATININE 1.16 09/20/2021    HM: Flu vaccine at [facility] this season.  Declines repeat covid booster.  Shingles vaccine - declines.   History Patient Active Problem List   Diagnosis Date Noted    Aortic atherosclerosis (HCC)[place]/07/2021   Ulcerative colitis (HCC)[place]/06/2016   Bruit 12/03/2012   Hyperglycemia 02/05/2011   Impotence due to erectile dysfunction 02/05/2011   Atrial fibrillation (HCC)[place]/16/2012   PALPITATIONS 04/23/2010   CHEST PAIN-UNSPECIFIED 04/17/2009   Hyperlipidemia 04/11/2009   Essential hypertension 04/11/2009   GERD 04/11/2009   Past Medical History:  Diagnosis Date   Atrial fibrillation (HCC)    Chest pain, unspecified    Esophageal reflux    Family history of ischemic heart disease    Other and unspecified hyperlipidemia    Palpitations    Ulcerative colitis    Unspecified essential hypertension    Past Surgical History:  Procedure Laterality Date   ELBOW SURGERY     left   KNEE ARTHROSCOPY     rihgt   VASECTOMY     Allergies  Allergen Reactions   Codeine Itching    REACTION: Reaction not known   Prior to Admission medications   Medication Sig Start Date End Date Taking? Authorizing Provider  amLODipine (NORVASC) 10 MG tablet TAKE 1 TABLET(10 MG) BY MOUTH DAILY 01/28/22  Yes GreeWendie Agreste  amlodipine-atorvastatin (CADUET) 10-20 MG tablet Take 1 tablet by mouth daily.   Yes [provider]  apixaban (ELIQUIS) 5 MG TABS tablet Take 1 tablet (5 mg) Two Times Daily. 12/05/21  Yes CrenLelon Perla  benazepril (LOTENSIN) 20 MG  tablet TAKE 1 TABLET(20 MG) BY MOUTH DAILY 02/10/22  Yes Wendie Agreste, MD  benazepril (LOTENSIN) 20 MG tablet Take by mouth. 11/09/21  Yes [provider]  LORazepam (ATIVAN) 0.5 MG tablet Take 0.5 mg by mouth at bedtime as needed. 02/23/20  Yes [provider]  mesalamine (LIALDA) 1.2 g EC tablet Take by mouth. 02/05/22  Yes [provider]  metoprolol succinate (TOPROL-XL) 25 MG 24 hr tablet TAKE 3 TABLETS(75 MG) BY MOUTH DAILY 11/19/21  Yes Lelon Perla, MD  omeprazole (PRILOSEC) 40 MG capsule TAKE 1 CAPSULE BY MOUTH EVERY DAY 01/31/22  Yes Wendie Agreste, MD   pravastatin (PRAVACHOL) 40 MG tablet TAKE 1 TABLET BY MOUTH DAILY 02/03/22  Yes Wendie Agreste, MD  pravastatin (PRAVACHOL) 40 MG tablet Take by mouth.   Yes [provider]  triamcinolone cream (KENALOG) 0.1 % Apply topically 2 (two) times daily as needed. 04/04/19  Yes Wendie Agreste, MD  apixaban (ELIQUIS) 5 MG TABS tablet Take by mouth. Patient not taking: Reported on 02/12/2022    [provider]  omeprazole (PRILOSEC) 40 MG capsule Take by mouth. Patient not taking: Reported on 02/12/2022    [provider]  predniSONE (DELTASONE) 5 MG tablet Take by mouth. Patient not taking: Reported on 02/12/2022 12/18/21   [provider]   Social History   Socioeconomic History   Marital status: Single    Spouse name: Not on file   Number of children: Not on file   Years of education: Not on file   Highest education level: Not on file  Occupational History   Not on file  Tobacco Use   Smoking status: Former    Years: 25.00    Types: Cigarettes, Cigars   Smokeless tobacco: Never  Substance and Sexual Activity   Alcohol use: Yes    Alcohol/week: 0.0 standard drinks of alcohol    Comment: pt has about 10 drinks or varying type on average throughout one week    Drug use: No   Sexual activity: Yes  Other Topics Concern   Not on file  Social History Narrative   Married. Education: [employer]. Exercise: Weekly.   Social Determinants of Health   Financial Resource Strain: Not on file  Food Insecurity: Not on file  Transportation Needs: Not on file  Physical Activity: Not on file  Stress: Not on file  Social Connections: Not on file  Intimate Partner Violence: Not on file    Review of Systems Per HPI   Objective:   Vitals:   02/12/22 1438  BP: 130/70  Pulse: (!) 54  Temp: 98.3 F (36.8 C)  SpO2: 99%  Weight: 222 lb 6.4 oz (100.9 kg)  Height: 6' 1"  (1.854 m)     Physical Exam Vitals reviewed.  Constitutional:      Appearance:  He is well-developed.  HENT:     Head: Normocephalic and atraumatic.  Neck:     Vascular: No carotid bruit or JVD.  Cardiovascular:     Rate and Rhythm: Normal rate and regular rhythm.     Heart sounds: Normal heart sounds. No murmur heard. Pulmonary:     Effort: Pulmonary effort is normal.     Breath sounds: Normal breath sounds. No rales.  Musculoskeletal:     Right lower leg: No edema.     Left lower leg: No edema.  Skin:    General: Skin is warm and dry.  Neurological:     Mental Status:  He is alert and oriented to person, place, and time.  Psychiatric:        Mood and Affect: Mood normal.        Assessment & Plan:  Joseph Smith is a 76 y.o. male . Essential hypertension - Plan: amLODipine (NORVASC) 10 MG tablet, benazepril (LOTENSIN) 20 MG tablet, Comprehensive metabolic panel  -Check labs, tolerating current dose of meds, without new cardiac symptoms or symptoms of atrial fibrillation.  Continue same regimen as above  Gastroesophageal reflux disease, unspecified whether esophagitis present - Plan: omeprazole (PRILOSEC) 40 MG capsule  -Stable with PPI daily, continue same.  Hyperlipidemia, unspecified hyperlipidemia type - Plan: pravastatin (PRAVACHOL) 40 MG tablet, Comprehensive metabolic panel, Lipid panel  -Tolerating pravastatin, continue same, check labs.  Atopic dermatitis, unspecified type - Plan: triamcinolone cream (KENALOG) 0.1 %  -Intermittent flares, continue Kenalog topical with RTC precautions  Situational stress - Plan: LORazepam (ATIVAN) 0.5 MG tablet  -Rare need for lorazepam, potential side effects and risks discussed.  Okay to refill for now with rare use, RTC precautions if increased frequency of use.  Paroxysmal atrial fibrillation (HCC)  -As above, stable.  Continue anticoagulation and beta-blocker for rate control.  Continue follow-up with cardiology.  Meds ordered this encounter  Medications   amLODipine (NORVASC) 10 MG tablet    Sig:  TAKE 1 TABLET(10 MG) BY MOUTH DAILY    Dispense:  90 tablet    Refill:  3    **Patient requests 90 days supply**   benazepril (LOTENSIN) 20 MG tablet    Sig: TAKE 1 TABLET(20 MG) BY MOUTH DAILY    Dispense:  90 tablet    Refill:  3   omeprazole (PRILOSEC) 40 MG capsule    Sig: Take 1 capsule (40 mg total) by mouth daily.    Dispense:  90 capsule    Refill:  3   pravastatin (PRAVACHOL) 40 MG tablet    Sig: TAKE 1 TABLET BY MOUTH DAILY    Dispense:  90 tablet    Refill:  3    **Patient requests 90 days supply**   triamcinolone cream (KENALOG) 0.1 %    Sig: Apply topically 2 (two) times daily as needed.    Dispense:  80 g    Refill:  1   LORazepam (ATIVAN) 0.5 MG tablet    Sig: Take 0.5-1 tablets (0.25-0.5 mg total) by mouth every 8 (eight) hours as needed.    Dispense:  30 tablet    Refill:  0   Patient Instructions  Thanks for coming in today.  No medication changes at this time.  If you do find that you require lorazepam more frequently, follow-up with me and we can certainly discuss that medicine or alternatives if needed.  I think it is reasonable to take for now if infrequent use.  If any concerns on your labs I will let you know.  Take care.     Signed,   Merri Ray, MD [place] 02/12/22 3:22 PM

## 2022-02-12 NOTE — Patient Instructions (Signed)
Thanks for coming in today.  No medication changes at this time.  If you do find that you require lorazepam more frequently, follow-up with me and we can certainly discuss that medicine or alternatives if needed.  I think it is reasonable to take for now if infrequent use.  If any concerns on your labs I will let you know.  Take care.

## 2022-02-13 LAB — LIPID PANEL
Cholesterol: 201 mg/dL — ABNORMAL HIGH (ref 0–200)
HDL: 83.4 mg/dL (ref >39.00)
LDL Cholesterol: 100 mg/dL — ABNORMAL HIGH (ref 0–99)
NonHDL: 117.24
Total CHOL/HDL Ratio: 2
Triglycerides: 84 mg/dL (ref 0.0–149.0)
VLDL: 16.8 mg/dL (ref 0.0–40.0)

## 2022-02-13 LAB — COMPREHENSIVE METABOLIC PANEL
ALT: 11 U/L (ref 0–53)
AST: 19 U/L (ref 0–37)
Albumin: 4.3 g/dL (ref 3.5–5.2)
Alkaline Phosphatase: 59 U/L (ref 39–117)
BUN: 16 mg/dL (ref 6–23)
CO2: 25 mEq/L (ref 19–32)
Calcium: 9.5 mg/dL (ref 8.4–10.5)
Chloride: 100 mEq/L (ref 96–112)
Creatinine, Ser: 1.18 mg/dL (ref 0.40–1.50)
GFR: 60.44 mL/min (ref >60.00)
Glucose, Bld: 95 mg/dL (ref 70–99)
Potassium: 4.3 mEq/L (ref 3.5–5.1)
Sodium: 137 mEq/L (ref 135–145)
Total Bilirubin: 0.3 mg/dL (ref 0.2–1.2)
Total Protein: 7.6 g/dL (ref 6.0–8.3)

## 2022-02-15 ENCOUNTER — Encounter: Payer: Self-pay | Admitting: Family Medicine

## 2022-02-19 ENCOUNTER — Telehealth: Payer: Self-pay

## 2022-02-19 NOTE — Telephone Encounter (Signed)
Called back and informed

## 2022-02-19 NOTE — Telephone Encounter (Signed)
Patient called back to office to speak with [facility].

## 2022-02-19 NOTE — Telephone Encounter (Signed)
Left pt a VM asking to call the office

## 2022-02-19 NOTE — Telephone Encounter (Signed)
-----   Message from Wendie Agreste, MD sent at 02/15/2022  5:35 PM EST ----- Lab letter  Cholesterol borderline elevated, can discuss with cardiology but I do not recommend any medication changes for now.  Kidney, liver tests. electrolytes look good.  Let me know if you have questions.  Dr. Carlota Raspberry

## 2022-03-02 ENCOUNTER — Other Ambulatory Visit: Payer: Self-pay | Admitting: Family Medicine

## 2022-03-02 DIAGNOSIS — I10 Essential (primary) hypertension: Secondary | ICD-10-CM

## 2022-04-24 DIAGNOSIS — R7301 Impaired fasting glucose: Secondary | ICD-10-CM | POA: Diagnosis not present

## 2022-04-24 DIAGNOSIS — K51211 Ulcerative (chronic) proctitis with rectal bleeding: Secondary | ICD-10-CM | POA: Diagnosis not present

## 2022-04-24 DIAGNOSIS — I10 Essential (primary) hypertension: Secondary | ICD-10-CM | POA: Diagnosis not present

## 2022-04-24 DIAGNOSIS — D6869 Other thrombophilia: Secondary | ICD-10-CM | POA: Diagnosis not present

## 2022-04-24 DIAGNOSIS — I480 Paroxysmal atrial fibrillation: Secondary | ICD-10-CM | POA: Diagnosis not present

## 2022-04-24 DIAGNOSIS — E782 Mixed hyperlipidemia: Secondary | ICD-10-CM | POA: Diagnosis not present

## 2022-04-24 DIAGNOSIS — D500 Iron deficiency anemia secondary to blood loss (chronic): Secondary | ICD-10-CM | POA: Diagnosis not present

## 2022-04-24 DIAGNOSIS — Z0000 Encounter for general adult medical examination without abnormal findings: Secondary | ICD-10-CM | POA: Diagnosis not present

## 2022-04-24 DIAGNOSIS — I700 Atherosclerosis of aorta: Secondary | ICD-10-CM | POA: Diagnosis not present

## 2022-04-24 DIAGNOSIS — Z125 Encounter for screening for malignant neoplasm of prostate: Secondary | ICD-10-CM | POA: Diagnosis not present

## 2022-04-24 DIAGNOSIS — I77810 Thoracic aortic ectasia: Secondary | ICD-10-CM | POA: Diagnosis not present

## 2022-05-14 DIAGNOSIS — K51211 Ulcerative (chronic) proctitis with rectal bleeding: Secondary | ICD-10-CM | POA: Diagnosis not present

## 2022-05-15 DIAGNOSIS — K51211 Ulcerative (chronic) proctitis with rectal bleeding: Secondary | ICD-10-CM | POA: Diagnosis not present

## 2022-06-01 ENCOUNTER — Other Ambulatory Visit: Payer: Self-pay | Admitting: Family Medicine

## 2022-06-01 DIAGNOSIS — F439 Reaction to severe stress, unspecified: Secondary | ICD-10-CM

## 2022-06-02 DIAGNOSIS — K51211 Ulcerative (chronic) proctitis with rectal bleeding: Secondary | ICD-10-CM | POA: Diagnosis not present

## 2022-06-02 DIAGNOSIS — Z111 Encounter for screening for respiratory tuberculosis: Secondary | ICD-10-CM | POA: Diagnosis not present

## 2022-06-02 NOTE — Telephone Encounter (Signed)
Lorazepam 0.5 mg LOV: 12/2/023 Last Refill:02/12/22 Upcoming appt: 08/14/22

## 2022-06-02 NOTE — Telephone Encounter (Signed)
Controlled substance database reviewed.  Lorazepam No. 30 filled 02/12/2022, discussed at his December 20 visit, infrequent use.  Refill ordered.

## 2022-06-09 ENCOUNTER — Other Ambulatory Visit: Payer: Self-pay | Admitting: Cardiology

## 2022-06-09 DIAGNOSIS — I480 Paroxysmal atrial fibrillation: Secondary | ICD-10-CM

## 2022-06-09 NOTE — Telephone Encounter (Signed)
Prescription refill request for Eliquis received. Indication: PAF Last office visit: 08/21/21 Scr: 1.18 on 02/12/22 Age: 77 Weight: 101.6kg  Based on above findings Eliquis 5mg  twice daily is the appropriate dose.  Refill approved.

## 2022-06-10 DIAGNOSIS — D509 Iron deficiency anemia, unspecified: Secondary | ICD-10-CM | POA: Diagnosis not present

## 2022-06-17 DIAGNOSIS — K5190 Ulcerative colitis, unspecified, without complications: Secondary | ICD-10-CM | POA: Diagnosis not present

## 2022-06-24 DIAGNOSIS — D509 Iron deficiency anemia, unspecified: Secondary | ICD-10-CM | POA: Diagnosis not present

## 2022-06-30 ENCOUNTER — Other Ambulatory Visit: Payer: Self-pay | Admitting: Family Medicine

## 2022-06-30 DIAGNOSIS — I10 Essential (primary) hypertension: Secondary | ICD-10-CM

## 2022-07-01 DIAGNOSIS — K5190 Ulcerative colitis, unspecified, without complications: Secondary | ICD-10-CM | POA: Diagnosis not present

## 2022-07-15 ENCOUNTER — Other Ambulatory Visit: Payer: Self-pay | Admitting: Family Medicine

## 2022-07-15 DIAGNOSIS — K219 Gastro-esophageal reflux disease without esophagitis: Secondary | ICD-10-CM

## 2022-07-24 DIAGNOSIS — D509 Iron deficiency anemia, unspecified: Secondary | ICD-10-CM | POA: Diagnosis not present

## 2022-07-29 DIAGNOSIS — K5190 Ulcerative colitis, unspecified, without complications: Secondary | ICD-10-CM | POA: Diagnosis not present

## 2022-08-04 DIAGNOSIS — K5190 Ulcerative colitis, unspecified, without complications: Secondary | ICD-10-CM | POA: Diagnosis not present

## 2022-08-14 ENCOUNTER — Ambulatory Visit: Payer: BC Managed Care – PPO | Admitting: Family Medicine

## 2022-08-19 ENCOUNTER — Telehealth: Payer: Self-pay | Admitting: Cardiology

## 2022-08-19 MED ORDER — METOPROLOL SUCCINATE ER 25 MG PO TB24: ORAL_TABLET | ORAL | 0 refills | Status: DC

## 2022-08-19 NOTE — Telephone Encounter (Signed)
*  STAT* If patient is at the pharmacy, call can be transferred to refill team.   1. Which medications need to be refilled? (please list name of each medication and dose if known) metoprolol succinate (TOPROL-XL) 25 MG 24 hr tablet   2. Which pharmacy/location (including street and city if local pharmacy) is medication to be sent to? [id number] - [place], [place] - [address]   3. Do they need a 30 day or 90 day supply? 90

## 2022-08-19 NOTE — Telephone Encounter (Signed)
Appointment scheduled for 10/15/22 with Dr. Jens Som.  90 day refill has been sent to [facility].

## 2022-09-23 DIAGNOSIS — K5190 Ulcerative colitis, unspecified, without complications: Secondary | ICD-10-CM | POA: Diagnosis not present

## 2022-09-23 DIAGNOSIS — Z5181 Encounter for therapeutic drug level monitoring: Secondary | ICD-10-CM | POA: Diagnosis not present

## 2022-10-09 NOTE — Progress Notes (Signed)
HPI: FU atrial fibrillation. Nuclear study in February of 2011 showed an ejection fraction of 62% and normal perfusion. Patient had a monitor in February of 2012 that showed documentation of atrial fibrillation. Treated with beta blocker and apixaban. Abdominal ultrasound October 2014 showed no aneurysm.  Echo April 2021 showed normal LV function, mildly dilated ascending aorta at 40 mm.  Monitor April 2021 showed occasional PAC, PVC, brief PAT and 4 beats nonsustained ventricular tachycardia.  Since he was last seen, the patient denies any dyspnea on exertion, orthopnea, PND, pedal edema, palpitations, syncope or chest pain.   Current Outpatient Medications  Medication Sig Dispense Refill   amLODipine (NORVASC) 10 MG tablet TAKE 1 TABLET(10 MG) BY MOUTH DAILY 30 tablet 3   benazepril (LOTENSIN) 20 MG tablet TAKE 1 TABLET(20 MG) BY MOUTH DAILY 90 tablet 3   ELIQUIS 5 MG TABS tablet TAKE 1 TABLET(5 MG) BY MOUTH TWICE DAILY 180 tablet 1   LORazepam (ATIVAN) 0.5 MG tablet TAKE 1/2 TO 1 TABLET(0.25 TO 0.5 MG) BY MOUTH EVERY 8 HOURS AS NEEDED 30 tablet 0   mesalamine (LIALDA) 1.2 g EC tablet Take by mouth.     metoprolol succinate (TOPROL-XL) 25 MG 24 hr tablet TAKE 3 TABLETS(75 MG) BY MOUTH DAILY 270 tablet 0   omeprazole (PRILOSEC) 40 MG capsule Take 1 capsule (40 mg total) by mouth daily. 90 capsule 3   pravastatin (PRAVACHOL) 40 MG tablet TAKE 1 TABLET BY MOUTH DAILY 90 tablet 3   triamcinolone cream (KENALOG) 0.1 % Apply topically 2 (two) times daily as needed. 80 g 1   No current facility-administered medications for this visit.     Past Medical History:  Diagnosis Date   Atrial fibrillation (HCC)    Chest pain, unspecified    Esophageal reflux    Family history of ischemic heart disease    Other and unspecified hyperlipidemia    Palpitations    Ulcerative colitis    Unspecified essential hypertension     Past Surgical History:  Procedure Laterality Date   ELBOW SURGERY      left   KNEE ARTHROSCOPY     rihgt   VASECTOMY      Social History   Socioeconomic History   Marital status: Single    Spouse name: Not on file   Number of children: Not on file   Years of education: Not on file   Highest education level: Not on file  Occupational History   Not on file  Tobacco Use   Smoking status: Former    Types: Cigarettes, Cigars   Smokeless tobacco: Never  Substance and Sexual Activity   Alcohol use: Yes    Alcohol/week: 0.0 standard drinks of alcohol    Comment: pt has about 10 drinks or varying type on average throughout one week    Drug use: No   Sexual activity: Yes  Other Topics Concern   Not on file  Social History Narrative   Married. Education: [employer]. Exercise: Weekly.   Social Determinants of Health   Financial Resource Strain: Not on file  Food Insecurity: Not on file  Transportation Needs: Not on file  Physical Activity: Not on file  Stress: Not on file  Social Connections: Unknown (07/09/2021)   [facility]   Social Network    Social Network: Not on file  Intimate Partner Violence: Unknown (05/31/2021)   Received from [facility]   HITS    Physically Hurt: Not on file  Insult or Talk Down To: Not on file    Threaten Physical Harm: Not on file    Scream or Curse: Not on file    Family History  Problem Relation Age of Onset   Stroke Brother    Coronary artery disease Other        family history    ROS: no fevers or chills, productive cough, hemoptysis, dysphasia, odynophagia, melena, hematochezia, dysuria, hematuria, rash, seizure activity, orthopnea, PND, pedal edema, claudication. Remaining systems are negative.  Physical Exam: Well-developed well-nourished in no acute distress.  Skin is warm and dry.  HEENT is normal.  Neck is supple.  Chest is clear to auscultation with normal expansion.  Cardiovascular exam is regular rate and rhythm.  Abdominal exam nontender or distended. No masses  palpated. Extremities show no edema. neuro grossly intact  EKG Interpretation Date/Time:  Wednesday October 15 2022 16:02:52 EDT Ventricular Rate:  53 PR Interval:  186 QRS Duration:  100 QT Interval:  450 QTC Calculation: 422 R Axis:   34  Text Interpretation: Sinus bradycardia T wave abnormality, consider inferior ischemia Confirmed by Olga Millers ([id number]) on 10/15/2022 4:11:22 PM    A/P  1 paroxysmal atrial fibrillation-patient remains in sinus rhythm.  Will continue apixaban and beta-blocker at present dose.  Can consider referral for ablation or initiation of antiarrhythmic therapy in the future if his episodes become more frequent.  2 hypertension-blood pressure is mildly elevated;   3 hyperlipidemia-continue statin.  Olga Millers, MD

## 2022-10-15 ENCOUNTER — Ambulatory Visit (INDEPENDENT_AMBULATORY_CARE_PROVIDER_SITE_OTHER): Payer: BC Managed Care – PPO | Admitting: Cardiology

## 2022-10-15 ENCOUNTER — Encounter: Payer: Self-pay | Admitting: Cardiology

## 2022-10-15 VITALS — BP 144/73 | HR 53 | Ht 73.0 in | Wt 221.4 lb

## 2022-10-15 DIAGNOSIS — E785 Hyperlipidemia, unspecified: Secondary | ICD-10-CM

## 2022-10-15 DIAGNOSIS — I10 Essential (primary) hypertension: Secondary | ICD-10-CM

## 2022-10-15 DIAGNOSIS — R9431 Abnormal electrocardiogram [ECG] [EKG]: Secondary | ICD-10-CM

## 2022-10-15 DIAGNOSIS — I480 Paroxysmal atrial fibrillation: Secondary | ICD-10-CM

## 2022-10-15 NOTE — Patient Instructions (Signed)

## 2022-10-28 ENCOUNTER — Telehealth: Payer: Self-pay | Admitting: Family Medicine

## 2022-10-28 NOTE — Telephone Encounter (Signed)
Cancellation reason for 08/14/2022 office visit was "(Found doctor group closer to home)".

## 2022-10-29 DIAGNOSIS — K5120 Ulcerative (chronic) proctitis without complications: Secondary | ICD-10-CM | POA: Diagnosis not present

## 2022-10-29 DIAGNOSIS — K4090 Unilateral inguinal hernia, without obstruction or gangrene, not specified as recurrent: Secondary | ICD-10-CM | POA: Diagnosis not present

## 2022-10-29 DIAGNOSIS — K420 Umbilical hernia with obstruction, without gangrene: Secondary | ICD-10-CM | POA: Diagnosis not present

## 2022-11-07 DIAGNOSIS — Z7901 Long term (current) use of anticoagulants: Secondary | ICD-10-CM | POA: Diagnosis not present

## 2022-11-07 DIAGNOSIS — Z1331 Encounter for screening for depression: Secondary | ICD-10-CM | POA: Diagnosis not present

## 2022-11-07 DIAGNOSIS — K4090 Unilateral inguinal hernia, without obstruction or gangrene, not specified as recurrent: Secondary | ICD-10-CM | POA: Diagnosis not present

## 2022-11-07 DIAGNOSIS — K429 Umbilical hernia without obstruction or gangrene: Secondary | ICD-10-CM | POA: Diagnosis not present

## 2022-11-17 ENCOUNTER — Other Ambulatory Visit: Payer: Self-pay | Admitting: Cardiology

## 2022-11-18 DIAGNOSIS — K5120 Ulcerative (chronic) proctitis without complications: Secondary | ICD-10-CM | POA: Diagnosis not present

## 2022-11-20 ENCOUNTER — Other Ambulatory Visit: Payer: Self-pay | Admitting: Cardiology

## 2022-11-20 DIAGNOSIS — I480 Paroxysmal atrial fibrillation: Secondary | ICD-10-CM

## 2022-11-20 NOTE — Telephone Encounter (Signed)
Prescription refill request for Eliquis received. Indication: Afib  Last office visit: 10/15/22 ([place])  Scr: 1.18 (02/12/22)  Age: 77 Weight: 100.4kg  Appropriate dose. Refill sent.

## 2022-12-04 ENCOUNTER — Telehealth: Payer: Self-pay | Admitting: Cardiology

## 2022-12-04 NOTE — Telephone Encounter (Signed)
Patient states symptoms this morning. States he "know what it feels like".  He states had this 3-4 years ago and his metoprolol was increased which resolved it. Asymptomatic other than feels the heartbeat. Current HR  122.  Has been since approx 9am. He is scheduled for hernia surgery on Tuesday.   Meds on med list are correct.  Taking Metoprolol succinate 75mg  daily.  He ask if possible to increase medication.   Patient states that if he does not answer to please LM on if he can increase medication or other recommendations

## 2022-12-04 NOTE — Telephone Encounter (Signed)
Called patient and LM to call office.

## 2022-12-04 NOTE — Telephone Encounter (Signed)
Patient returned staff call. 

## 2022-12-04 NOTE — Telephone Encounter (Signed)
Patient c/o Palpitations:  STAT if patient reporting lightheadedness, shortness of breath, or chest pain  How long have you had palpitations/irregular HR/ Afib? Are you having the symptoms now?   All morning  Are you currently experiencing lightheadedness, SOB or CP?   No  Do you have a history of afib (atrial fibrillation) or irregular heart rhythm?   Yes  Have you checked your BP or HR? (document readings if available):    Are you experiencing any other symptoms?  No   Patient stated he is concerned he is having afib and wants to know if his medication can be adjusted.

## 2022-12-05 ENCOUNTER — Telehealth: Payer: Self-pay | Admitting: Cardiology

## 2022-12-05 MED ORDER — METOPROLOL SUCCINATE ER 100 MG PO TB24: 100.0000 mg | ORAL_TABLET | Freq: Every day | ORAL | 3 refills | Status: DC

## 2022-12-05 NOTE — Telephone Encounter (Signed)
Spoke with pt, Aware of dr Ludwig Clarks recommendations.  New script sent to the pharmacy  Patient did not want to make an appointment at this time.

## 2022-12-05 NOTE — Telephone Encounter (Signed)
Patient with diagnosis of atrial fibrillation on Eliquis for anticoagulation.    Procedure:  Robotic Inguinal Repair for Hernia   Date of Surgery:  12-09-22        CHA2DS2-VASc Score = 4   This indicates a 4.8% annual risk of stroke. The patient's score is based upon: CHF History: 0 HTN History: 1 Diabetes History: 0 Stroke History: 0 Vascular Disease History: 1 Age Score: 2 Gender Score: 0    CrCl 70 Platelet count 250  Per office protocol, patient can hold Eliquis for 2 days prior to procedure.   Patient will not need bridging with Lovenox (enoxaparin) around procedure.  **This guidance is not considered finalized until pre-operative APP has relayed final recommendations.**

## 2022-12-05 NOTE — Telephone Encounter (Signed)
Pre-operative Risk Assessment    Patient Name: Joseph Smith  DOB: Nov 24, 1945 MRN: [id number]      Request for Surgical Clearance    Procedure:  Robotic Inguinal Repair for Hernia  Date of Surgery:  12-09-22                            Surgeon:  Dr Lowella Petties Surgeon's Group or Practice Name:   Phone number:  [phone] Fax number:  [phone]   Type of Clearance Requested:   - Pharmacy:  Hold Apixaban (Eliquis) - can he hold it 2 days prior to surgery   Type of Anesthesia:  General    Additional requests/questions:    Rivka Safer   12/05/2022, 10:12 AM

## 2022-12-05 NOTE — Telephone Encounter (Signed)
Patient Name: Joseph Smith  DOB: [birth year] MRN: [id number]  Primary Cardiologist: Olga Millers, MD  Clinical pharmacists have reviewed the patient's past medical history, labs, and current medications as part of preoperative protocol coverage. The following recommendations have been made:  Patient with diagnosis of atrial fibrillation on Eliquis for anticoagulation.     Procedure:  Robotic Inguinal Repair for Hernia   Date of Surgery:  12-09-22        CHA2DS2-VASc Score = 4  This indicates a 4.8% annual risk of stroke. The patient's score is based upon: CHF History: 0 HTN History: 1 Diabetes History: 0 Stroke History: 0 Vascular Disease History: 1 Age Score: 2 Gender Score: 0     CrCl 70 Platelet count 250   Per office protocol, patient can hold Eliquis for 2 days prior to procedure.   Patient will not need bridging with Lovenox (enoxaparin) around procedure. Please resume Eliquis as soon as possible postprocedure, at the discretion of the surgeon.   I will route this recommendation to the requesting party via Epic fax function and remove from pre-op pool.  Please call with questions.  Joylene Grapes, NP 12/05/2022, 4:20 PM

## 2022-12-08 ENCOUNTER — Telehealth: Payer: Self-pay | Admitting: Cardiology

## 2022-12-08 NOTE — Telephone Encounter (Signed)
Spoke with patient and he states he has been in AFIB since Thursday. He called in and provider recommended increasing metoprolol to 100 mg. He is still currently in AFIB. HR ranges from 70-90. No chest pain or SOB. HR currently 80.  He states he is supposed to have hernia surgery tomorrow but they advised him to check with cardiologist to see if he needs to reschedule. Did offer APP appointment but he declined.

## 2022-12-08 NOTE — Telephone Encounter (Signed)
Patient said that he is supposed to have hernia surgery tomorrow but is experiencing afib and wanted to know if he should keep appt or cancel it

## 2022-12-08 NOTE — Telephone Encounter (Signed)
Spoke with pt, Aware of dr Ludwig Clarks recommendations. He will let us know if the atrial fib persist after surgery.

## 2022-12-09 DIAGNOSIS — I4891 Unspecified atrial fibrillation: Secondary | ICD-10-CM | POA: Diagnosis not present

## 2022-12-09 DIAGNOSIS — Z79899 Other long term (current) drug therapy: Secondary | ICD-10-CM | POA: Diagnosis not present

## 2022-12-09 DIAGNOSIS — Z7901 Long term (current) use of anticoagulants: Secondary | ICD-10-CM | POA: Diagnosis not present

## 2022-12-09 DIAGNOSIS — K4090 Unilateral inguinal hernia, without obstruction or gangrene, not specified as recurrent: Secondary | ICD-10-CM | POA: Diagnosis not present

## 2022-12-09 DIAGNOSIS — K219 Gastro-esophageal reflux disease without esophagitis: Secondary | ICD-10-CM | POA: Diagnosis not present

## 2022-12-09 DIAGNOSIS — K429 Umbilical hernia without obstruction or gangrene: Secondary | ICD-10-CM | POA: Diagnosis not present

## 2022-12-09 DIAGNOSIS — E785 Hyperlipidemia, unspecified: Secondary | ICD-10-CM | POA: Diagnosis not present

## 2022-12-09 DIAGNOSIS — I10 Essential (primary) hypertension: Secondary | ICD-10-CM | POA: Diagnosis not present

## 2022-12-10 ENCOUNTER — Other Ambulatory Visit: Payer: Self-pay | Admitting: *Deleted

## 2022-12-10 ENCOUNTER — Ambulatory Visit (INDEPENDENT_AMBULATORY_CARE_PROVIDER_SITE_OTHER): Payer: BC Managed Care – PPO | Admitting: Cardiology

## 2022-12-10 ENCOUNTER — Encounter: Payer: Self-pay | Admitting: Cardiology

## 2022-12-10 ENCOUNTER — Telehealth: Payer: Self-pay | Admitting: Cardiology

## 2022-12-10 VITALS — BP 132/89 | HR 114 | Ht 73.0 in | Wt 220.4 lb

## 2022-12-10 DIAGNOSIS — Z09 Encounter for follow-up examination after completed treatment for conditions other than malignant neoplasm: Secondary | ICD-10-CM | POA: Diagnosis not present

## 2022-12-10 DIAGNOSIS — I480 Paroxysmal atrial fibrillation: Secondary | ICD-10-CM | POA: Diagnosis not present

## 2022-12-10 DIAGNOSIS — I10 Essential (primary) hypertension: Secondary | ICD-10-CM | POA: Diagnosis not present

## 2022-12-10 DIAGNOSIS — E785 Hyperlipidemia, unspecified: Secondary | ICD-10-CM

## 2022-12-10 MED ORDER — METOPROLOL SUCCINATE ER 25 MG PO TB24
25.0000 mg | ORAL_TABLET | Freq: Every day | ORAL | 3 refills | Status: DC
Start: 2022-12-10 — End: 2023-07-08

## 2022-12-10 NOTE — Telephone Encounter (Signed)
Spoke with pt, he will be seen today in the [place] office.

## 2022-12-10 NOTE — Telephone Encounter (Signed)
Pt's Hr was 130 when on the table to get his Hernia repair and he is still in afib.- The Anesthesiologist, Dr and patient decided to wait on the procedure due to high heart rate. They were worried that the medication to decrease heart rate would also have adverse effects on BP- decrease it too much. Advised to see Cardiologist  Patient feels he needs to be seen as soon as possible to try and get his heart rate and BP under control. He was seen in August and told he was doing good, but feels that something has definitely changed and needs to get it under control. Informed him that I would send all of this information to [place]. He verbalized understanding.

## 2022-12-10 NOTE — H&P (View-Only) (Signed)
HPI: FU atrial fibrillation. Nuclear study in February of 2011 showed an ejection fraction of 62% and normal perfusion. Patient had a monitor in February of 2012 that showed documentation of atrial fibrillation. Treated with beta blocker and apixaban. Abdominal ultrasound October 2014 showed no aneurysm.  Echo April 2021 showed normal LV function, mildly dilated ascending aorta at 40 mm.  Monitor April 2021 showed occasional PAC, PVC, brief PAT and 4 beats nonsustained ventricular tachycardia.  Patient contacted the office recently and felt as though he had developed recurrent atrial fibrillation.  Toprol was increased as an outpatient.  He went to have hernia surgery and his heart rate was elevated and the procedure was canceled.  He was asked to follow-up with cardiology.  Since he was last seen, patient developed recurrent atrial fibrillation approximately 1 week ago.  He feels his heart skipping but denies dyspnea, chest pain or syncope.  Current Outpatient Medications  Medication Sig Dispense Refill   amLODipine (NORVASC) 10 MG tablet TAKE 1 TABLET(10 MG) BY MOUTH DAILY 30 tablet 3   benazepril (LOTENSIN) 20 MG tablet TAKE 1 TABLET(20 MG) BY MOUTH DAILY 90 tablet 3   ELIQUIS 5 MG TABS tablet TAKE 1 TABLET(5 MG) BY MOUTH TWICE DAILY 180 tablet 1   LORazepam (ATIVAN) 0.5 MG tablet TAKE 1/2 TO 1 TABLET(0.25 TO 0.5 MG) BY MOUTH EVERY 8 HOURS AS NEEDED 30 tablet 0   metoprolol succinate (TOPROL-XL) 100 MG 24 hr tablet Take 1 tablet (100 mg total) by mouth daily. 90 tablet 3   omeprazole (PRILOSEC) 40 MG capsule Take 1 capsule (40 mg total) by mouth daily. 90 capsule 3   pravastatin (PRAVACHOL) 40 MG tablet TAKE 1 TABLET BY MOUTH DAILY 90 tablet 3   triamcinolone cream (KENALOG) 0.1 % Apply topically 2 (two) times daily as needed. 80 g 1   mesalamine (LIALDA) 1.2 g EC tablet Take by mouth. (Patient not taking: Reported on 10/15/2022)     No current facility-administered medications for this  visit.     Past Medical History:  Diagnosis Date   Atrial fibrillation (HCC)    Chest pain, unspecified    Esophageal reflux    Family history of ischemic heart disease    Other and unspecified hyperlipidemia    Palpitations    Ulcerative colitis    Unspecified essential hypertension     Past Surgical History:  Procedure Laterality Date   ELBOW SURGERY     left   KNEE ARTHROSCOPY     rihgt   VASECTOMY      Social History   Socioeconomic History   Marital status: Single    Spouse name: Not on file   Number of children: Not on file   Years of education: Not on file   Highest education level: Not on file  Occupational History   Not on file  Tobacco Use   Smoking status: Former    Types: Cigarettes, Cigars   Smokeless tobacco: Never  Substance and Sexual Activity   Alcohol use: Yes    Alcohol/week: 0.0 standard drinks of alcohol    Comment: pt has about 10 drinks or varying type on average throughout one week    Drug use: No   Sexual activity: Yes  Other Topics Concern   Not on file  Social History Narrative   Married. Education: [employer]. Exercise: Weekly.   Social Determinants of Health   Financial Resource Strain: Not on file  Food Insecurity: Not on file  Transportation  Needs: Not on file  Physical Activity: Not on file  Stress: No Stress Concern Present (12/09/2022)   Received from [facility] - Occupational Stress Questionnaire    Feeling of Stress : Not at all  Social Connections: Unknown (07/09/2021)   Received from [facility], [facility]   Social Network    Social Network: Not on file  Intimate Partner Violence: Not At Risk (12/09/2022)   Received from [facility]   HITS    Over the last 12 months how often did your partner physically hurt you?: 1    Over the last 12 months how often did your partner insult you or talk down to you?: 1    Over the last 12 months how often did your partner  threaten you with physical harm?: 1    Over the last 12 months how often did your partner scream or curse at you?: 1    Family History  Problem Relation Age of Onset   Stroke Brother    Coronary artery disease Other        family history    ROS: no fevers or chills, productive cough, hemoptysis, dysphasia, odynophagia, melena, hematochezia, dysuria, hematuria, rash, seizure activity, orthopnea, PND, pedal edema, claudication. Remaining systems are negative.  Physical Exam: Well-developed well-nourished in no acute distress.  Skin is warm and dry.  HEENT is normal.  Neck is supple.  Chest is clear to auscultation with normal expansion.  Cardiovascular exam is irregular and tachycardic Abdominal exam nontender or distended. No masses palpated. Extremities show no edema. neuro grossly intact  EKG Interpretation Date/Time:  Wednesday December 10 2022 15:42:23 EDT Ventricular Rate:  114 PR Interval:    QRS Duration:  94 QT Interval:  318 QTC Calculation: 438 R Axis:   0  Text Interpretation: Atrial fibrillation with rapid ventricular response ST & T wave abnormality, consider anterolateral ischemia Confirmed by Olga Millers ([id number]) on 12/10/2022 3:55:43 PM    A/P  1 persistent atrial fibrillation-patient has developed recurrent atrial fibrillation.  This is his first episode in approximately 4 years.  His heart rate is elevated.  Increase Toprol to 125 mg daily (follow heart rate closely as he had sinus bradycardia when he was in sinus rhythm prior to increasing the Toprol).  His apixaban was resumed yesterday.  Will plan to proceed with cardioversion in 4 weeks.  He will then need to continue apixaban for 4 weeks afterwards.  If he hold sinus rhythm then we will continue with present management.  If his episodes become more frequent we will consider antiarrhythmic versus ablation.  Will repeat echocardiogram.  His hernia surgery will be delayed until his atrial fibrillation is  corrected.  2 hypertension-blood pressure is controlled.  Continue present medical regimen.  3 hyperlipidemia-continue statin.  Olga Millers, MD

## 2022-12-10 NOTE — Progress Notes (Signed)
HPI: FU atrial fibrillation. Nuclear study in February of 2011 showed an ejection fraction of 62% and normal perfusion. Patient had a monitor in February of 2012 that showed documentation of atrial fibrillation. Treated with beta blocker and apixaban. Abdominal ultrasound October 2014 showed no aneurysm.  Echo April 2021 showed normal LV function, mildly dilated ascending aorta at 40 mm.  Monitor April 2021 showed occasional PAC, PVC, brief PAT and 4 beats nonsustained ventricular tachycardia.  Patient contacted the office recently and felt as though he had developed recurrent atrial fibrillation.  Toprol was increased as an outpatient.  He went to have hernia surgery and his heart rate was elevated and the procedure was canceled.  He was asked to follow-up with cardiology.  Since he was last seen, patient developed recurrent atrial fibrillation approximately 1 week ago.  He feels his heart skipping but denies dyspnea, chest pain or syncope.  Current Outpatient Medications  Medication Sig Dispense Refill   amLODipine (NORVASC) 10 MG tablet TAKE 1 TABLET(10 MG) BY MOUTH DAILY 30 tablet 3   benazepril (LOTENSIN) 20 MG tablet TAKE 1 TABLET(20 MG) BY MOUTH DAILY 90 tablet 3   ELIQUIS 5 MG TABS tablet TAKE 1 TABLET(5 MG) BY MOUTH TWICE DAILY 180 tablet 1   LORazepam (ATIVAN) 0.5 MG tablet TAKE 1/2 TO 1 TABLET(0.25 TO 0.5 MG) BY MOUTH EVERY 8 HOURS AS NEEDED 30 tablet 0   metoprolol succinate (TOPROL-XL) 100 MG 24 hr tablet Take 1 tablet (100 mg total) by mouth daily. 90 tablet 3   omeprazole (PRILOSEC) 40 MG capsule Take 1 capsule (40 mg total) by mouth daily. 90 capsule 3   pravastatin (PRAVACHOL) 40 MG tablet TAKE 1 TABLET BY MOUTH DAILY 90 tablet 3   triamcinolone cream (KENALOG) 0.1 % Apply topically 2 (two) times daily as needed. 80 g 1   mesalamine (LIALDA) 1.2 g EC tablet Take by mouth. (Patient not taking: Reported on 10/15/2022)     No current facility-administered medications for this  visit.     Past Medical History:  Diagnosis Date   Atrial fibrillation (HCC)    Chest pain, unspecified    Esophageal reflux    Family history of ischemic heart disease    Other and unspecified hyperlipidemia    Palpitations    Ulcerative colitis    Unspecified essential hypertension     Past Surgical History:  Procedure Laterality Date   ELBOW SURGERY     left   KNEE ARTHROSCOPY     rihgt   VASECTOMY      Social History   Socioeconomic History   Marital status: Single    Spouse name: Not on file   Number of children: Not on file   Years of education: Not on file   Highest education level: Not on file  Occupational History   Not on file  Tobacco Use   Smoking status: Former    Types: Cigarettes, Cigars   Smokeless tobacco: Never  Substance and Sexual Activity   Alcohol use: Yes    Alcohol/week: 0.0 standard drinks of alcohol    Comment: pt has about 10 drinks or varying type on average throughout one week    Drug use: No   Sexual activity: Yes  Other Topics Concern   Not on file  Social History Narrative   Married. Education: [employer]. Exercise: Weekly.   Social Determinants of Health   Financial Resource Strain: Not on file  Food Insecurity: Not on file  Transportation  Needs: Not on file  Physical Activity: Not on file  Stress: No Stress Concern Present (12/09/2022)   Received from [facility] - Occupational Stress Questionnaire    Feeling of Stress : Not at all  Social Connections: Unknown (07/09/2021)   Received from [facility], [facility]   Social Network    Social Network: Not on file  Intimate Partner Violence: Not At Risk (12/09/2022)   Received from [facility]   HITS    Over the last 12 months how often did your partner physically hurt you?: 1    Over the last 12 months how often did your partner insult you or talk down to you?: 1    Over the last 12 months how often did your partner  threaten you with physical harm?: 1    Over the last 12 months how often did your partner scream or curse at you?: 1    Family History  Problem Relation Age of Onset   Stroke Brother    Coronary artery disease Other        family history    ROS: no fevers or chills, productive cough, hemoptysis, dysphasia, odynophagia, melena, hematochezia, dysuria, hematuria, rash, seizure activity, orthopnea, PND, pedal edema, claudication. Remaining systems are negative.  Physical Exam: Well-developed well-nourished in no acute distress.  Skin is warm and dry.  HEENT is normal.  Neck is supple.  Chest is clear to auscultation with normal expansion.  Cardiovascular exam is irregular and tachycardic Abdominal exam nontender or distended. No masses palpated. Extremities show no edema. neuro grossly intact  EKG Interpretation Date/Time:  Wednesday December 10 2022 15:42:23 EDT Ventricular Rate:  114 PR Interval:    QRS Duration:  94 QT Interval:  318 QTC Calculation: 438 R Axis:   0  Text Interpretation: Atrial fibrillation with rapid ventricular response ST & T wave abnormality, consider anterolateral ischemia Confirmed by Olga Millers ([id number]) on 12/10/2022 3:55:43 PM    A/P  1 persistent atrial fibrillation-patient has developed recurrent atrial fibrillation.  This is his first episode in approximately 4 years.  His heart rate is elevated.  Increase Toprol to 125 mg daily (follow heart rate closely as he had sinus bradycardia when he was in sinus rhythm prior to increasing the Toprol).  His apixaban was resumed yesterday.  Will plan to proceed with cardioversion in 4 weeks.  He will then need to continue apixaban for 4 weeks afterwards.  If he hold sinus rhythm then we will continue with present management.  If his episodes become more frequent we will consider antiarrhythmic versus ablation.  Will repeat echocardiogram.  His hernia surgery will be delayed until his atrial fibrillation is  corrected.  2 hypertension-blood pressure is controlled.  Continue present medical regimen.  3 hyperlipidemia-continue statin.  Olga Millers, MD

## 2022-12-10 NOTE — Telephone Encounter (Signed)
Pt would like a callback from nurses Debra regarding surgery. Pt states that he was unable to have surgery yesterday due to elevated BP and HR. Please advise

## 2022-12-10 NOTE — Patient Instructions (Signed)
Medication Instructions:   INCREASE METOPROLOL TO 125 MG ONCE DAILY= 1 100 MG TABLET AND 1 25 MG TABLET ONCE DAILY  *If you need a refill on your cardiac medications before your next appointment, please call your pharmacy*   Lab Work:  Your physician recommends that you return for lab work in: 2 WEEKS-DO NOT NEED TO FAST  If you have labs (blood work) drawn today and your tests are completely normal, you will receive your results only by: MyChart Message (if you have MyChart) OR A paper copy in the mail If you have any lab test that is abnormal or we need to change your treatment, we will call you to review the results.   Testing/Procedures:    You are scheduled for a Cardioversion on Monday, November 11 with Dr. Cristal Deer.  Please arrive at the [facility] (Main Entrance A) at [facility]: [address] [place], [place] [address] at 10:00 AM (This time is 1 hour(s) before your procedure to ensure your preparation). Free valet parking service is available. You will check in at ADMITTING. The support person will be asked to wait in the waiting room.  It is OK to have someone drop you off and come back when you are ready to be discharged.      DIET:  Nothing to eat or drink after midnight except a sip of water with medications (see medication instructions below)  DO NOT TAKE METOPROLOL THE MORNING OF THE PROCEDURE  Continue taking your anticoagulant (blood thinner): Apixaban (Eliquis).   FYI:  For your safety, and to allow [place] to monitor your vital signs accurately during the surgery/procedure we request: If you have artificial nails, gel coating, SNS etc, please have those removed prior to your surgery/procedure. Not having the nail coverings /polish removed may result in cancellation or delay of your surgery/procedure.  You must have a responsible person to drive you home and stay in the waiting area during your procedure. Failure to do so could result in  cancellation.  Bring your insurance cards.  *Special Note: Every effort is made to have your procedure done on time. Occasionally there are emergencies that occur at the hospital that may cause delays. Please be patient if a delay does occur.    Your physician has requested that you have an echocardiogram. Echocardiography is a painless test that uses sound waves to create images of your heart. It provides your doctor with information about the size and shape of your heart and how well your heart's chambers and valves are working. This procedure takes approximately one hour. There are no restrictions for this procedure. Please do NOT wear cologne, perfume, aftershave, or lotions (deodorant is allowed). Please arrive 15 minutes prior to your appointment time. [address]    Follow-Up: At [facility], you and your health needs are our priority.  As part of our continuing mission to provide you with exceptional heart care, we have created designated Provider Care Teams.  These Care Teams include your primary Cardiologist (physician) and Advanced Practice Providers (APPs -  Physician Assistants and Nurse Practitioners) who all work together to provide you with the care you need, when you need it.  We recommend signing up for the patient portal called "MyChart".  Sign up information is provided on this After Visit Summary.  MyChart is used to connect with patients for Virtual Visits (Telemedicine).  Patients are able to view lab/test results, encounter notes, upcoming appointments, etc.  Non-urgent messages  can be sent to your provider as well.   To learn more about what you can do with MyChart, go to [email or web address].    Your next appointment:   12 week(s)  Provider:   Olga Millers, MD

## 2022-12-26 DIAGNOSIS — I480 Paroxysmal atrial fibrillation: Secondary | ICD-10-CM | POA: Diagnosis not present

## 2022-12-27 LAB — BASIC METABOLIC PANEL
BUN/Creatinine Ratio: 16 (ref 10–24)
BUN: 18 mg/dL (ref 8–27)
CO2: 21 mmol/L (ref 20–29)
Calcium: 9.1 mg/dL (ref 8.6–10.2)
Chloride: 102 mmol/L (ref 96–106)
Creatinine, Ser: 1.1 mg/dL (ref 0.76–1.27)
Glucose: 115 mg/dL — ABNORMAL HIGH (ref 70–99)
Potassium: 4.1 mmol/L (ref 3.5–5.2)
Sodium: 139 mmol/L (ref 134–144)
eGFR: 70 mL/min/{1.73_m2} (ref >59)

## 2022-12-27 LAB — CBC
Hematocrit: 41.9 % (ref 37.5–51.0)
Hemoglobin: 13.7 g/dL (ref 13.0–17.7)
MCH: 33.1 pg — ABNORMAL HIGH (ref 26.6–33.0)
MCHC: 32.7 g/dL (ref 31.5–35.7)
MCV: 101 fL — ABNORMAL HIGH (ref 79–97)
Platelets: 227 10*3/uL (ref 150–450)
RBC: 4.14 x10E6/uL (ref 4.14–5.80)
RDW: 12.6 % (ref 11.6–15.4)
WBC: 5.6 10*3/uL (ref 3.4–10.8)

## 2022-12-29 ENCOUNTER — Encounter: Payer: Self-pay | Admitting: *Deleted

## 2023-01-02 NOTE — Progress Notes (Signed)
Unable to reach patient about procedure, but was able to leave a detailed message. Stated that the patient needed to arrive at the hospital at 0745 , remain NPO after 0000, needs to have a ride home and a responsible adult to stay with them for 24 hours after the procedure. Instructed the patient to call back if they had any questions.

## 2023-01-05 ENCOUNTER — Ambulatory Visit (HOSPITAL_COMMUNITY)
Admission: RE | Admit: 2023-01-05 | Discharge: 2023-01-05 | Disposition: A | Discharge status: Home / Self Care | Payer: BC Managed Care – PPO | Attending: Cardiology | Admitting: Cardiology

## 2023-01-05 ENCOUNTER — Ambulatory Visit (HOSPITAL_COMMUNITY): Payer: BC Managed Care – PPO | Admitting: Anesthesiology

## 2023-01-05 ENCOUNTER — Encounter (HOSPITAL_COMMUNITY)
Admission: RE | Disposition: A | Discharge status: Home / Self Care | Payer: Self-pay | Source: Home / Self Care | Attending: Cardiology

## 2023-01-05 ENCOUNTER — Encounter (HOSPITAL_COMMUNITY): Payer: Self-pay | Admitting: Cardiology

## 2023-01-05 DIAGNOSIS — I4819 Other persistent atrial fibrillation: Secondary | ICD-10-CM | POA: Insufficient documentation

## 2023-01-05 DIAGNOSIS — I10 Essential (primary) hypertension: Secondary | ICD-10-CM | POA: Insufficient documentation

## 2023-01-05 DIAGNOSIS — Z79899 Other long term (current) drug therapy: Secondary | ICD-10-CM | POA: Insufficient documentation

## 2023-01-05 DIAGNOSIS — I480 Paroxysmal atrial fibrillation: Secondary | ICD-10-CM

## 2023-01-05 DIAGNOSIS — Z7901 Long term (current) use of anticoagulants: Secondary | ICD-10-CM | POA: Diagnosis not present

## 2023-01-05 DIAGNOSIS — K219 Gastro-esophageal reflux disease without esophagitis: Secondary | ICD-10-CM | POA: Insufficient documentation

## 2023-01-05 DIAGNOSIS — Z87891 Personal history of nicotine dependence: Secondary | ICD-10-CM | POA: Insufficient documentation

## 2023-01-05 DIAGNOSIS — E785 Hyperlipidemia, unspecified: Secondary | ICD-10-CM | POA: Diagnosis not present

## 2023-01-05 DIAGNOSIS — I4891 Unspecified atrial fibrillation: Secondary | ICD-10-CM | POA: Diagnosis not present

## 2023-01-05 HISTORY — PX: CARDIOVERSION: SHX1299

## 2023-01-05 SURGERY — CARDIOVERSION
Anesthesia: General

## 2023-01-05 MED ORDER — PROPOFOL 10 MG/ML IV BOLUS
INTRAVENOUS | Status: DC | PRN
  Administered 2023-01-05: 70 mg via INTRAVENOUS

## 2023-01-05 MED ORDER — SODIUM CHLORIDE 0.9 % IV SOLN: INTRAVENOUS | Status: DC

## 2023-01-05 MED ORDER — LIDOCAINE 2% (20 MG/ML) 5 ML SYRINGE
INTRAMUSCULAR | Status: DC | PRN
  Administered 2023-01-05: 40 mg via INTRAVENOUS

## 2023-01-05 SURGICAL SUPPLY — 1 items: PAD DEFIB RADIO PHYSIO CONN (PAD) ×1 IMPLANT

## 2023-01-05 NOTE — Interval H&P Note (Signed)
History and Physical Interval Note:  01/05/2023 11:18 AM  Jeri Lager  has presented today for surgery, with the diagnosis of AFIB.  The various methods of treatment have been discussed with the patient and family. After consideration of risks, benefits and other options for treatment, the patient has consented to  Procedure(s): CARDIOVERSION (N/A) as a surgical intervention.  The patient's history has been reviewed, patient examined, no change in status, stable for surgery.  I have reviewed the patient's chart and labs.  Questions were answered to the patient's satisfaction.     Wilman Tucker Cristal Deer

## 2023-01-05 NOTE — Anesthesia Preprocedure Evaluation (Addendum)
Anesthesia Evaluation  Patient identified by MRN, date of birth, ID band Patient awake    Reviewed: Allergy & Precautions, NPO status , Patient's Chart, lab work & pertinent test results  Airway Mallampati: II  TM Distance: >3 FB Neck ROM: Full    Dental no notable dental hx.    Pulmonary former smoker   Pulmonary exam normal        Cardiovascular hypertension, Pt. on medications and Pt. on home beta blockers + dysrhythmias Atrial Fibrillation  Rhythm:Irregular Rate:Normal     Neuro/Psych negative neurological ROS  negative psych ROS   GI/Hepatic Neg liver ROS, PUD,GERD  Medicated,,  Endo/Other  negative endocrine ROS    Renal/GU negative Renal ROS  negative genitourinary   Musculoskeletal negative musculoskeletal ROS (+)    Abdominal Normal abdominal exam  (+)   Peds  Hematology Lab Results      Component                Value               Date                      WBC                      5.6                 12/26/2022                HGB                      13.7                12/26/2022                HCT                      41.9                12/26/2022                MCV                      101 (H)             12/26/2022                PLT                      227                 12/26/2022             Lab Results      Component                Value               Date                      NA                       139                 12/26/2022                K  4.1                 12/26/2022                CO2                      21                  12/26/2022                GLUCOSE                  115 (H)             12/26/2022                BUN                      18                  12/26/2022                CREATININE               1.10                12/26/2022                CALCIUM                  9.1                 12/26/2022                GFR                       60.44               02/12/2022                EGFR                     70                  12/26/2022                GFRNONAA                 77                  12/09/2019              Anesthesia Other Findings   Reproductive/Obstetrics                             Anesthesia Physical Anesthesia Plan  ASA: 3  Anesthesia Plan: General   Post-op Pain Management:    Induction: Intravenous  PONV Risk Score and Plan: 2 and Treatment may vary due to age or medical condition  Airway Management Planned: Mask  Additional Equipment: None  Intra-op Plan:   Post-operative Plan:   Informed Consent: I have reviewed the patients History and Physical, chart, labs and discussed the procedure including the risks, benefits and alternatives for the proposed anesthesia with the patient or authorized representative who has indicated his/her understanding and acceptance.     Dental advisory given  Plan Discussed with: CRNA  Anesthesia Plan Comments:  Anesthesia Quick Evaluation

## 2023-01-05 NOTE — Anesthesia Postprocedure Evaluation (Signed)
Anesthesia Post Note  Patient: Joseph Smith  Procedure(s) Performed: CARDIOVERSION     Patient location during evaluation: PACU Anesthesia Type: General Level of consciousness: awake and alert Pain management: pain level controlled Vital Signs Assessment: post-procedure vital signs reviewed and stable Respiratory status: spontaneous breathing, nonlabored ventilation, respiratory function stable and patient connected to nasal cannula oxygen Cardiovascular status: blood pressure returned to baseline and stable Postop Assessment: no apparent nausea or vomiting Anesthetic complications: no   No notable events documented.  Last Vitals:  Vitals:   01/05/23 1215 01/05/23 1220  BP: 135/86 135/85  Pulse: 67 70  Resp: 16 17  Temp:    SpO2: 97% 96%    Last Pain:  Vitals:   01/05/23 1158  TempSrc: Temporal                 Earl Lites P Letasha Kershaw

## 2023-01-05 NOTE — CV Procedure (Signed)
Procedure:   DCCV  Indication:  Symptomatic atrial fibrillation  Procedure Note:  The patient signed informed consent.  They have had had therapeutic anticoagulation with apixaban greater than 3 weeks.  Anesthesia was administered by Dr. Nance Pew.  Patient received 40 mg IV lidocaine and 70 mg IV propofol.Adequate airway was maintained throughout and vital followed per protocol.  They were cardioverted x 1 with 200J of biphasic synchronized energy.  They converted to NSR.  There were no apparent complications.  The patient had normal neuro status and respiratory status post procedure with vitals stable as recorded elsewhere.    Follow up:  They will continue on current medical therapy and follow up with cardiology as scheduled.  Jodelle Red, MD PhD 01/05/2023 11:51 AM

## 2023-01-05 NOTE — Transfer of Care (Signed)
Immediate Anesthesia Transfer of Care Note  Patient: Joseph Smith  Procedure(s) Performed: CARDIOVERSION  Patient Location: PACU  Anesthesia Type:General  Level of Consciousness: awake, drowsy, patient cooperative, and responds to stimulation  Airway & Oxygen Therapy: Patient Spontanous Breathing and Patient connected to face mask oxygen  Post-op Assessment: Report given to RN and Post -op Vital signs reviewed and stable  Post vital signs: Reviewed and stable  Last Vitals:  Vitals Value Taken Time  BP 148/92 01/05/23 1145  Temp    Pulse 114 01/05/23 1146  Resp 20 01/05/23 1146  SpO2 97 % 01/05/23 1146  Vitals shown include unfiled device data.  Last Pain: There were no vitals filed for this visit.       Complications: No notable events documented.

## 2023-01-06 ENCOUNTER — Encounter (HOSPITAL_COMMUNITY): Payer: Self-pay | Admitting: Cardiology

## 2023-01-13 DIAGNOSIS — K5120 Ulcerative (chronic) proctitis without complications: Secondary | ICD-10-CM | POA: Diagnosis not present

## 2023-01-16 ENCOUNTER — Ambulatory Visit (HOSPITAL_COMMUNITY): Payer: BC Managed Care – PPO | Attending: Cardiology

## 2023-01-16 DIAGNOSIS — I480 Paroxysmal atrial fibrillation: Secondary | ICD-10-CM | POA: Diagnosis not present

## 2023-01-16 LAB — ECHOCARDIOGRAM COMPLETE
Area-P 1/2: 2.87 cm2
S' Lateral: 3.8 cm

## 2023-01-19 ENCOUNTER — Encounter: Payer: Self-pay | Admitting: *Deleted

## 2023-02-04 DIAGNOSIS — H40013 Open angle with borderline findings, low risk, bilateral: Secondary | ICD-10-CM | POA: Diagnosis not present

## 2023-03-10 DIAGNOSIS — K5190 Ulcerative colitis, unspecified, without complications: Secondary | ICD-10-CM | POA: Diagnosis not present

## 2023-03-26 NOTE — Progress Notes (Signed)
HPI: FU atrial fibrillation. Nuclear study in February of 2011 showed an ejection fraction of 62% and normal perfusion. Patient had a monitor in February of 2012 that showed documentation of atrial fibrillation. Treated with beta blocker and apixaban. Abdominal ultrasound October 2014 showed no aneurysm. Monitor April 2021 showed occasional PAC, PVC, brief PAT and 4 beats nonsustained ventricular tachycardia. Patient had cardioversion November 2024.  Echocardiogram November 2024 showed normal LV function, mild left atrial enlargement, mildly dilated aortic root at 40 mm with a ascending aorta 41 mm.  Since he was last seen, the patient denies any dyspnea on exertion, orthopnea, PND, pedal edema, palpitations, syncope or chest pain.   Current Outpatient Medications  Medication Sig Dispense Refill   amLODipine (NORVASC) 10 MG tablet TAKE 1 TABLET(10 MG) BY MOUTH DAILY 30 tablet 3   benazepril (LOTENSIN) 20 MG tablet TAKE 1 TABLET(20 MG) BY MOUTH DAILY 90 tablet 3   ELIQUIS 5 MG TABS tablet TAKE 1 TABLET(5 MG) BY MOUTH TWICE DAILY 180 tablet 1   Glycerin-Hypromellose-PEG 400 (DRY EYE RELIEF DROPS) 0.2-0.2-1 % SOLN Place 1 drop into both eyes daily as needed (Dry eye).     metoprolol succinate (TOPROL XL) 25 MG 24 hr tablet Take 1 tablet (25 mg total) by mouth daily. (Patient taking differently: Take 25 mg by mouth See admin instructions. Take with 100 mg for a total of 125 mg daily) 90 tablet 3   metoprolol succinate (TOPROL-XL) 100 MG 24 hr tablet Take 1 tablet (100 mg total) by mouth daily. (Patient taking differently: Take 100 mg by mouth See admin instructions. Take with 25 mg for a total of 125 mg daily) 90 tablet 3   omeprazole (PRILOSEC) 40 MG capsule Take 1 capsule (40 mg total) by mouth daily. 90 capsule 3   pravastatin (PRAVACHOL) 40 MG tablet TAKE 1 TABLET BY MOUTH DAILY 90 tablet 3   triamcinolone cream (KENALOG) 0.1 % Apply topically 2 (two) times daily as needed. (Patient taking  differently: Apply 1 Application topically once a week.) 80 g 1   No current facility-administered medications for this visit.     Past Medical History:  Diagnosis Date   Atrial fibrillation (HCC)    Chest pain, unspecified    Esophageal reflux    Family history of ischemic heart disease    Other and unspecified hyperlipidemia    Palpitations    Ulcerative colitis    Unspecified essential hypertension     Past Surgical History:  Procedure Laterality Date   CARDIOVERSION N/A 01/05/2023   Procedure: CARDIOVERSION;  Surgeon: Jodelle Red, MD;  Location: [facility] INVASIVE CV LAB;  Service: Cardiovascular;  Laterality: N/A;   ELBOW SURGERY     left   KNEE ARTHROSCOPY     rihgt   VASECTOMY      Social History   Socioeconomic History   Marital status: Married    Spouse name: Not on file   Number of children: Not on file   Years of education: Not on file   Highest education level: Not on file  Occupational History   Not on file  Tobacco Use   Smoking status: Former    Types: Cigarettes, Cigars   Smokeless tobacco: Never  Substance and Sexual Activity   Alcohol use: Yes    Alcohol/week: 0.0 standard drinks of alcohol    Comment: pt has about 10 drinks or varying type on average throughout one week    Drug use: No   Sexual activity:  Yes  Other Topics Concern   Not on file  Social History Narrative   Married. Education: [employer]. Exercise: Weekly.   Social Drivers of Corporate investment banker Strain: Not on file  Food Insecurity: Not on file  Transportation Needs: Not on file  Physical Activity: Not on file  Stress: No Stress Concern Present (12/09/2022)   Received from [facility] - Occupational Stress Questionnaire    Feeling of Stress : Not at all  Social Connections: Unknown (07/09/2021)   Received from [facility]   Social Network    Social Network: Not on file  Intimate Partner Violence:  Not At Risk (12/09/2022)   Received from [facility]   HITS    Over the last 12 months how often did your partner physically hurt you?: Never    Over the last 12 months how often did your partner insult you or talk down to you?: Never    Over the last 12 months how often did your partner threaten you with physical harm?: Never    Over the last 12 months how often did your partner scream or curse at you?: Never    Family History  Problem Relation Age of Onset   Stroke Brother    Coronary artery disease Other        family history    ROS: no fevers or chills, productive cough, hemoptysis, dysphasia, odynophagia, melena, hematochezia, dysuria, hematuria, rash, seizure activity, orthopnea, PND, pedal edema, claudication. Remaining systems are negative.  Physical Exam: Well-developed well-nourished in no acute distress.  Skin is warm and dry.  HEENT is normal.  Neck is supple.  Chest is clear to auscultation with normal expansion.  Cardiovascular exam is regular rate and rhythm.  Abdominal exam nontender or distended. No masses palpated. Extremities show no edema. neuro grossly intact  EKG Interpretation Date/Time:  Wednesday April 08 2023 14:00:06 EST Ventricular Rate:  56 PR Interval:  154 QRS Duration:  96 QT Interval:  442 QTC Calculation: 426 R Axis:   43  Text Interpretation: Sinus bradycardia Cannot rule out Anterior infarct , age undetermined When compared with ECG of 05-Jan-2023 12:12, Nonspecific T wave abnormality no longer evident in Lateral leads Confirmed by Olga Millers ([id number]) on 04/08/2023 2:10:47 PM    A/P  1 paroxysmal atrial fibrillation-patient remains in sinus rhythm status post recent cardioversion.  Will continue Toprol for rate control if atrial fibrillation recurs.  Continue apixaban.  Can consider referral for ablation in the future if he has more frequent episodes.  2 hypertension-blood pressure is controlled.  Continue present medications  and follow.  3 hyperlipidemia-continue statin.  Olga Millers, MD

## 2023-03-27 DIAGNOSIS — K429 Umbilical hernia without obstruction or gangrene: Secondary | ICD-10-CM | POA: Diagnosis not present

## 2023-03-27 DIAGNOSIS — I4891 Unspecified atrial fibrillation: Secondary | ICD-10-CM | POA: Diagnosis not present

## 2023-03-27 DIAGNOSIS — K4090 Unilateral inguinal hernia, without obstruction or gangrene, not specified as recurrent: Secondary | ICD-10-CM | POA: Diagnosis not present

## 2023-03-27 DIAGNOSIS — I10 Essential (primary) hypertension: Secondary | ICD-10-CM | POA: Diagnosis not present

## 2023-03-27 DIAGNOSIS — K219 Gastro-esophageal reflux disease without esophagitis: Secondary | ICD-10-CM | POA: Diagnosis not present

## 2023-03-27 DIAGNOSIS — Z7901 Long term (current) use of anticoagulants: Secondary | ICD-10-CM | POA: Diagnosis not present

## 2023-03-27 DIAGNOSIS — Z79899 Other long term (current) drug therapy: Secondary | ICD-10-CM | POA: Diagnosis not present

## 2023-03-27 DIAGNOSIS — Z885 Allergy status to narcotic agent status: Secondary | ICD-10-CM | POA: Diagnosis not present

## 2023-04-08 ENCOUNTER — Encounter: Payer: Self-pay | Admitting: Cardiology

## 2023-04-08 ENCOUNTER — Ambulatory Visit (INDEPENDENT_AMBULATORY_CARE_PROVIDER_SITE_OTHER): Payer: BC Managed Care – PPO | Admitting: Cardiology

## 2023-04-08 VITALS — BP 138/60 | HR 56 | Ht 73.0 in | Wt 221.8 lb

## 2023-04-08 DIAGNOSIS — I10 Essential (primary) hypertension: Secondary | ICD-10-CM | POA: Diagnosis not present

## 2023-04-08 DIAGNOSIS — I480 Paroxysmal atrial fibrillation: Secondary | ICD-10-CM | POA: Diagnosis not present

## 2023-04-08 DIAGNOSIS — E785 Hyperlipidemia, unspecified: Secondary | ICD-10-CM

## 2023-04-08 NOTE — Patient Instructions (Signed)
Follow-Up: At [facility], you and your health needs are our priority.  As part of our continuing mission to provide you with exceptional heart care, we have created designated Provider Care Teams.  These Care Teams include your primary Cardiologist (physician) and Advanced Practice Providers (APPs -  Physician Assistants and Nurse Practitioners) who all work together to provide you with the care you need, when you need it.  We recommend signing up for the patient portal called "MyChart".  Sign up information is provided on this After Visit Summary.  MyChart is used to connect with patients for Virtual Visits (Telemedicine).  Patients are able to view lab/test results, encounter notes, upcoming appointments, etc.  Non-urgent messages can be sent to your provider as well.   To learn more about what you can do with MyChart, go to [email or web address].    Your next appointment:   12 month(s)  Provider:   Olga Millers, MD

## 2023-04-28 DIAGNOSIS — Z1330 Encounter for screening examination for mental health and behavioral disorders, unspecified: Secondary | ICD-10-CM | POA: Diagnosis not present

## 2023-04-28 DIAGNOSIS — Z9889 Other specified postprocedural states: Secondary | ICD-10-CM | POA: Diagnosis not present

## 2023-04-28 DIAGNOSIS — K5130 Ulcerative (chronic) rectosigmoiditis without complications: Secondary | ICD-10-CM | POA: Diagnosis not present

## 2023-05-13 DIAGNOSIS — K5130 Ulcerative (chronic) rectosigmoiditis without complications: Secondary | ICD-10-CM | POA: Diagnosis not present

## 2023-05-19 DIAGNOSIS — E782 Mixed hyperlipidemia: Secondary | ICD-10-CM | POA: Diagnosis not present

## 2023-05-19 DIAGNOSIS — Z0000 Encounter for general adult medical examination without abnormal findings: Secondary | ICD-10-CM | POA: Diagnosis not present

## 2023-05-19 DIAGNOSIS — I10 Essential (primary) hypertension: Secondary | ICD-10-CM | POA: Diagnosis not present

## 2023-05-19 DIAGNOSIS — I700 Atherosclerosis of aorta: Secondary | ICD-10-CM | POA: Diagnosis not present

## 2023-05-19 DIAGNOSIS — D500 Iron deficiency anemia secondary to blood loss (chronic): Secondary | ICD-10-CM | POA: Diagnosis not present

## 2023-05-19 DIAGNOSIS — R7309 Other abnormal glucose: Secondary | ICD-10-CM | POA: Diagnosis not present

## 2023-05-19 DIAGNOSIS — I480 Paroxysmal atrial fibrillation: Secondary | ICD-10-CM | POA: Diagnosis not present

## 2023-05-19 DIAGNOSIS — D6869 Other thrombophilia: Secondary | ICD-10-CM | POA: Diagnosis not present

## 2023-05-19 DIAGNOSIS — Z125 Encounter for screening for malignant neoplasm of prostate: Secondary | ICD-10-CM | POA: Diagnosis not present

## 2023-06-24 ENCOUNTER — Other Ambulatory Visit: Payer: Self-pay

## 2023-06-24 DIAGNOSIS — K5130 Ulcerative (chronic) rectosigmoiditis without complications: Secondary | ICD-10-CM | POA: Diagnosis not present

## 2023-06-24 DIAGNOSIS — I480 Paroxysmal atrial fibrillation: Secondary | ICD-10-CM

## 2023-06-24 MED ORDER — APIXABAN 5 MG PO TABS: 5.0000 mg | ORAL_TABLET | Freq: Two times a day (BID) | ORAL | 1 refills | Status: DC

## 2023-06-24 NOTE — Telephone Encounter (Signed)
 Received faxed Eliquis  refill request from [facility] in [place], [place].  Pt last saw Dr Audery Blazing on 04/08/23, last labs 12/26/22 Creat 1.10, age 78, weight 100.6, based on specified criteria pt is on appropriate dosage of Eliquis  5mg  BID for afib.  Will refill rx.

## 2023-07-01 DIAGNOSIS — I10 Essential (primary) hypertension: Secondary | ICD-10-CM | POA: Diagnosis not present

## 2023-07-08 ENCOUNTER — Other Ambulatory Visit: Payer: Self-pay | Admitting: Cardiology

## 2023-07-08 DIAGNOSIS — I480 Paroxysmal atrial fibrillation: Secondary | ICD-10-CM

## 2023-08-05 ENCOUNTER — Telehealth: Payer: Self-pay | Admitting: Cardiology

## 2023-08-05 DIAGNOSIS — I10 Essential (primary) hypertension: Secondary | ICD-10-CM

## 2023-08-05 DIAGNOSIS — K5130 Ulcerative (chronic) rectosigmoiditis without complications: Secondary | ICD-10-CM | POA: Diagnosis not present

## 2023-08-05 NOTE — Telephone Encounter (Signed)
 Patient is calling in asking that Dr. Audery Blazing looks over his medication. He states that his bp has been running high. The only what his bp has been one, which is 157/87. Please advise

## 2023-08-05 NOTE — Telephone Encounter (Signed)
 Spoke with pt over the phone. Pt stated at last OV with Dr. Audery Blazing and PCP his BP was elevated. Pt went for infusion therapy today and it was 157/87. Pt would like for Dr. Audery Blazing to review and see if medications need to be adjusted. Any medications need to be sent to [facility] in [place].

## 2023-08-06 MED ORDER — BENAZEPRIL HCL 40 MG PO TABS: 40.0000 mg | ORAL_TABLET | Freq: Every day | ORAL | 3 refills | Status: AC

## 2023-08-06 NOTE — Telephone Encounter (Signed)
 Left message for pt to call.

## 2023-08-06 NOTE — Telephone Encounter (Signed)
Spoke with pt, Aware of dr crenshaw's recommendations. New script sent to the pharmacy  

## 2023-08-17 ENCOUNTER — Telehealth: Payer: Self-pay | Admitting: Cardiology

## 2023-08-17 NOTE — Telephone Encounter (Signed)
 Last OV note 04/08/23: paroxysmal atrial fibrillation-patient remains in sinus rhythm status post recent cardioversion. Will continue Toprol  for rate control if atrial fibrillation recurs. Continue apixaban . Can consider referral for ablation in the future if he has more frequent episodes.    Left a message for the pt to call back.

## 2023-08-17 NOTE — Telephone Encounter (Signed)
 Patient c/o Palpitations:  STAT if patient reporting lightheadedness, shortness of breath, or chest pain  How long have you had palpitations/irregular HR/ Afib? Are you having the symptoms now? Afib since yesterday afternoon   Are you currently experiencing lightheadedness, SOB or CP? No   Do you have a history of afib (atrial fibrillation) or irregular heart rhythm? Yes   Have you checked your BP or HR? (document readings if available): hr in 80's   Are you experiencing any other symptoms? No    Pt states his device shows he is back in afib since yesterday and he was told to c/b if it happened again. Please advise.

## 2023-08-18 NOTE — Telephone Encounter (Signed)
 Follow Up:      Patient is returning a call from yesterday.

## 2023-08-18 NOTE — Telephone Encounter (Signed)
Spoke with pt, Aware of dr crenshaw's recommendations.  Follow up scheduled  

## 2023-08-18 NOTE — Telephone Encounter (Signed)
 Patient stated he has been having A. FIB since Sunday afternoon, HR in 80's and he wanted to let Dr. Pietro know. Informed him that Dr. Vertie office note wanted to refer him to EP for ablation if he started having more frequent episodes. Patient is taking his eliquis  and metoprolol . Will send to Dr. Pietro for advisement. Patient stated to please leave detailed message on his voicemail if he does not answer.

## 2023-08-19 ENCOUNTER — Encounter (HOSPITAL_COMMUNITY): Payer: Self-pay | Admitting: Physician Assistant

## 2023-08-19 ENCOUNTER — Ambulatory Visit (HOSPITAL_COMMUNITY)
Admission: RE | Admit: 2023-08-19 | Discharge: 2023-08-19 | Disposition: A | Discharge status: Home / Self Care | Source: Ambulatory Visit | Attending: Physician Assistant | Admitting: Physician Assistant

## 2023-08-19 VITALS — BP 124/90 | HR 115 | Ht 73.0 in | Wt 219.2 lb

## 2023-08-19 DIAGNOSIS — I480 Paroxysmal atrial fibrillation: Secondary | ICD-10-CM

## 2023-08-19 DIAGNOSIS — D6869 Other thrombophilia: Secondary | ICD-10-CM

## 2023-08-19 DIAGNOSIS — I4819 Other persistent atrial fibrillation: Secondary | ICD-10-CM | POA: Diagnosis not present

## 2023-08-19 NOTE — Progress Notes (Signed)
 Primary Care Physician: Pietro Redell RAMAN, MD Primary Cardiologist: Redell Pietro, MD Electrophysiologist: None  Referring Physician: Dr Pietro Ozell JONELLE Joseph is a 78 y.o. male with a history of HTN, HLD, atrial fibrillation who presents for follow up in the [facility].  The patient was initially diagnosed with atrial fibrillation remotely. He became persistent and underwent DCCV on 01/05/23. Patient is on Eliquis  for stroke prevention.    Patient presents today for follow up for atrial fibrillation. [facility] called [facility] 08/18/23 with recurrent afib. He has symptoms of palpitations. There were no specific triggers that he could identify however he does admit that he has been under stress with the health of his in-laws. No bleeding issues on anticoagulation.   Today, he denies symptoms of chest pain, shortness of breath, orthopnea, PND, lower extremity edema, dizziness, presyncope, syncope, snoring, daytime somnolence, bleeding, or neurologic sequela. The patient is tolerating medications without difficulties and is otherwise without complaint today.    Atrial Fibrillation Risk Factors:  he does not have symptoms or diagnosis of sleep apnea. he does not have a history of rheumatic fever.   Atrial Fibrillation Management history:  Previous antiarrhythmic drugs: none Previous cardioversions: 01/05/23 Previous ablations: none Anticoagulation history: Eliquis   ROS- All systems are reviewed and negative except as per the HPI above.  Past Medical History:  Diagnosis Date   Atrial fibrillation (HCC)    Chest pain, unspecified    Esophageal reflux    Family history of ischemic heart disease    Other and unspecified hyperlipidemia    Palpitations    Ulcerative colitis    Unspecified essential hypertension     Current Outpatient Medications  Medication Sig Dispense Refill   amLODipine  (NORVASC ) 10 MG tablet TAKE 1 TABLET(10 MG) BY MOUTH DAILY 30  tablet 3   apixaban  (ELIQUIS ) 5 MG TABS tablet Take 1 tablet (5 mg total) by mouth 2 (two) times daily. 180 tablet 1   benazepril  (LOTENSIN ) 40 MG tablet Take 1 tablet (40 mg total) by mouth daily. 90 tablet 3   Glycerin-Hypromellose-PEG 400 (DRY EYE RELIEF DROPS) 0.2-0.2-1 % SOLN Place 1 drop into both eyes daily as needed (Dry eye).     metoprolol  succinate (TOPROL -XL) 100 MG 24 hr tablet Take 1 tablet (100 mg total) by mouth daily. 90 tablet 3   metoprolol  succinate (TOPROL -XL) 25 MG 24 hr tablet TAKE 3 TABLETS(75 MG) BY MOUTH DAILY 270 tablet 3   omeprazole  (PRILOSEC) 40 MG capsule Take 1 capsule (40 mg total) by mouth daily. 90 capsule 3   pravastatin  (PRAVACHOL ) 40 MG tablet TAKE 1 TABLET BY MOUTH DAILY 90 tablet 3   triamcinolone  cream (KENALOG ) 0.1 % Apply topically 2 (two) times daily as needed. 80 g 1   No current facility-administered medications for this encounter.    Physical Exam: BP (!) 124/90   Pulse (!) 115   Ht 6' 1 (1.854 m)   Wt 99.4 kg   BMI 28.92 kg/m   GEN: Well nourished, well developed in no acute distress CARDIAC: Irregularly irregular rate and rhythm, no murmurs, rubs, gallops RESPIRATORY:  Clear to auscultation without rales, wheezing or rhonchi  ABDOMEN: Soft, non-tender, non-distended EXTREMITIES:  No edema; No deformity   Wt Readings from Last 3 Encounters:  08/19/23 99.4 kg  04/08/23 100.6 kg  12/10/22 100 kg     EKG today demonstrates  Afib Vent. rate 115 BPM PR interval * ms QRS duration 90 ms QT/QTcB 328/453  ms   Echo 01/16/23 demonstrated   1. Left ventricular ejection fraction, by estimation, is 60 to 65%. The  left ventricle has normal function. The left ventricle has no regional  wall motion abnormalities. Left ventricular diastolic parameters were  normal.   2. Right ventricular systolic function is normal. The right ventricular  size is normal.   3. Left atrial size was mildly dilated.   4. The mitral valve is normal in  structure. Trivial mitral valve  regurgitation. No evidence of mitral stenosis.   5. The aortic valve is tricuspid. Aortic valve regurgitation is not  visualized. Aortic valve sclerosis is present, with no evidence of aortic  valve stenosis.   6. Aortic dilatation noted. There is mild dilatation of the aortic root,  measuring 40 mm. There is mild dilatation of the ascending aorta,  measuring 41 mm.   7. The inferior vena cava is normal in size with greater than 50%  respiratory variability, suggesting right atrial pressure of 3 mmHg.    CHA2DS2-VASc Score = 3  The patient's score is based upon: CHF History: 0 HTN History: 1 Diabetes History: 0 Stroke History: 0 Vascular Disease History: 0 Age Score: 2 Gender Score: 0       ASSESSMENT AND PLAN: Persistent Atrial Fibrillation (ICD10:  I48.19) The patient's CHA2DS2-VASc score is 3, indicating a 3.2% annual risk of stroke.   Patient back in afib, symptomatic We discussed rhythm control options today. Short term, will plan for repeat DCCV. He denies any missed doses of anticoagulation in the past 3 weeks. Long term, patient would like to avoid more medication. Will refer to EP to discuss ablation. Continue Eliquis  5 mg BID Continue Toprol  125 mg daily Check bmet/cbc  Secondary Hypercoagulable State (ICD10:  D68.69) The patient is at significant risk for stroke/thromboembolism based upon his CHA2DS2-VASc Score of 3.  Continue Apixaban  (Eliquis ). No bleeding issues.   HTN Stable on current regimen    Follow up with EP to establish care and discuss afib ablation.     Informed Consent   Shared Decision Making/Informed Consent The risks (stroke, cardiac arrhythmias rarely resulting in the need for a temporary or permanent pacemaker, skin irritation or burns and complications associated with conscious sedation including aspiration, arrhythmia, respiratory failure and death), benefits (restoration of normal sinus rhythm) and  alternatives of a direct current cardioversion were explained in detail to Mr. Edmonston and he agrees to proceed.        [facility] [address] [place], [place] [address] [phone]

## 2023-08-19 NOTE — H&P (View-Only) (Signed)
 Primary Care Physician: Pietro Redell RAMAN, MD Primary Cardiologist: Redell Pietro, MD Electrophysiologist: None  Referring Physician: Dr Pietro Ozell JONELLE Joseph Smith is a 78 y.o. male with a history of HTN, HLD, atrial fibrillation who presents for follow up in the [facility].  The patient was initially diagnosed with atrial fibrillation remotely. He became persistent and underwent DCCV on 01/05/23. Patient is on Eliquis  for stroke prevention.    Patient presents today for follow up for atrial fibrillation. [facility] with recurrent afib. He has symptoms of palpitations. There were no specific triggers that he could identify however he does admit that he has been under stress with the health of his in-laws. No bleeding issues on anticoagulation.   Today, he denies symptoms of chest pain, shortness of breath, orthopnea, PND, lower extremity edema, dizziness, presyncope, syncope, snoring, daytime somnolence, bleeding, or neurologic sequela. The patient is tolerating medications without difficulties and is otherwise without complaint today.    Atrial Fibrillation Risk Factors:  he does not have symptoms or diagnosis of sleep apnea. he does not have a history of rheumatic fever.   Atrial Fibrillation Management history:  Previous antiarrhythmic drugs: none Previous cardioversions: 01/05/23 Previous ablations: none Anticoagulation history: Eliquis   ROS- All systems are reviewed and negative except as per the HPI above.  Past Medical History:  Diagnosis Date   Atrial fibrillation (HCC)    Chest pain, unspecified    Esophageal reflux    Family history of ischemic heart disease    Other and unspecified hyperlipidemia    Palpitations    Ulcerative colitis    Unspecified essential hypertension     Current Outpatient Medications  Medication Sig Dispense Refill   amLODipine  (NORVASC ) 10 MG tablet TAKE 1 TABLET(10 MG) BY MOUTH DAILY 30  tablet 3   apixaban  (ELIQUIS ) 5 MG TABS tablet Take 1 tablet (5 mg total) by mouth 2 (two) times daily. 180 tablet 1   benazepril  (LOTENSIN ) 40 MG tablet Take 1 tablet (40 mg total) by mouth daily. 90 tablet 3   Glycerin-Hypromellose-PEG 400 (DRY EYE RELIEF DROPS) 0.2-0.2-1 % SOLN Place 1 drop into both eyes daily as needed (Dry eye).     metoprolol  succinate (TOPROL -XL) 100 MG 24 hr tablet Take 1 tablet (100 mg total) by mouth daily. 90 tablet 3   metoprolol  succinate (TOPROL -XL) 25 MG 24 hr tablet TAKE 3 TABLETS(75 MG) BY MOUTH DAILY 270 tablet 3   omeprazole  (PRILOSEC) 40 MG capsule Take 1 capsule (40 mg total) by mouth daily. 90 capsule 3   pravastatin  (PRAVACHOL ) 40 MG tablet TAKE 1 TABLET BY MOUTH DAILY 90 tablet 3   triamcinolone  cream (KENALOG ) 0.1 % Apply topically 2 (two) times daily as needed. 80 g 1   No current facility-administered medications for this encounter.    Physical Exam: BP (!) 124/90   Pulse (!) 115   Ht 6' 1 (1.854 m)   Wt 99.4 kg   BMI 28.92 kg/m   GEN: Well nourished, well developed in no acute distress CARDIAC: Irregularly irregular rate and rhythm, no murmurs, rubs, gallops RESPIRATORY:  Clear to auscultation without rales, wheezing or rhonchi  ABDOMEN: Soft, non-tender, non-distended EXTREMITIES:  No edema; No deformity   Wt Readings from Last 3 Encounters:  08/19/23 99.4 kg  04/08/23 100.6 kg  12/10/22 100 kg     EKG today demonstrates  Afib Vent. rate 115 BPM PR interval * ms QRS duration 90 ms QT/QTcB 328/453  ms   Echo 01/16/23 demonstrated   1. Left ventricular ejection fraction, by estimation, is 60 to 65%. The  left ventricle has normal function. The left ventricle has no regional  wall motion abnormalities. Left ventricular diastolic parameters were  normal.   2. Right ventricular systolic function is normal. The right ventricular  size is normal.   3. Left atrial size was mildly dilated.   4. The mitral valve is normal in  structure. Trivial mitral valve  regurgitation. No evidence of mitral stenosis.   5. The aortic valve is tricuspid. Aortic valve regurgitation is not  visualized. Aortic valve sclerosis is present, with no evidence of aortic  valve stenosis.   6. Aortic dilatation noted. There is mild dilatation of the aortic root,  measuring 40 mm. There is mild dilatation of the ascending aorta,  measuring 41 mm.   7. The inferior vena cava is normal in size with greater than 50%  respiratory variability, suggesting right atrial pressure of 3 mmHg.    CHA2DS2-VASc Score = 3  The patient's score is based upon: CHF History: 0 HTN History: 1 Diabetes History: 0 Stroke History: 0 Vascular Disease History: 0 Age Score: 2 Gender Score: 0       ASSESSMENT AND PLAN: Persistent Atrial Fibrillation (ICD10:  I48.19) The patient's CHA2DS2-VASc score is 3, indicating a 3.2% annual risk of stroke.   Patient back in afib, symptomatic We discussed rhythm control options today. Short term, will plan for repeat DCCV. He denies any missed doses of anticoagulation in the past 3 weeks. Long term, patient would like to avoid more medication. Will refer to EP to discuss ablation. Continue Eliquis  5 mg BID Continue Toprol  125 mg daily Check bmet/cbc  Secondary Hypercoagulable State (ICD10:  D68.69) The patient is at significant risk for stroke/thromboembolism based upon his CHA2DS2-VASc Score of 3.  Continue Apixaban  (Eliquis ). No bleeding issues.   HTN Stable on current regimen    Follow up with EP to establish care and discuss afib ablation.     Informed Consent   Shared Decision Making/Informed Consent The risks (stroke, cardiac arrhythmias rarely resulting in the need for a temporary or permanent pacemaker, skin irritation or burns and complications associated with conscious sedation including aspiration, arrhythmia, respiratory failure and death), benefits (restoration of normal sinus rhythm) and  alternatives of a direct current cardioversion were explained in detail to Joseph Smith and he agrees to proceed.        [facility] [address] [place], [place] [address] [phone]

## 2023-08-19 NOTE — Patient Instructions (Signed)
 Cardioversion scheduled for: June 27 Friday    - Arrive at the [facility] ([address]  and check in with ADMITTING at 12:00 pm   - Do not eat or drink anything after midnight the night prior to your procedure.   - Take all your morning medication (except diabetic medications) with a sip of water prior to arrival.  - Do NOT miss any doses of your blood thinner - if you should miss a dose or take a dose more than 4 hours late -- please notify our office immediately.  - You will not be able to drive home after your procedure. Please ensure you have a responsible adult to drive you home. You will need someone with you for 24 hours post procedure.     - Expect to be in the procedural area approximately 2 hours.   - If you feel as if you go back into normal rhythm prior to scheduled cardioversion, please notify our office immediately.   If your procedure is canceled in the cardioversion suite you will be charged a cancellation fee.

## 2023-08-20 ENCOUNTER — Ambulatory Visit (HOSPITAL_COMMUNITY): Payer: Self-pay | Admitting: Physician Assistant

## 2023-08-20 LAB — BASIC METABOLIC PANEL WITH GFR
BUN/Creatinine Ratio: 14 (ref 10–24)
BUN: 17 mg/dL (ref 8–27)
CO2: 20 mmol/L (ref 20–29)
Calcium: 9.6 mg/dL (ref 8.6–10.2)
Chloride: 101 mmol/L (ref 96–106)
Creatinine, Ser: 1.25 mg/dL (ref 0.76–1.27)
Glucose: 110 mg/dL — ABNORMAL HIGH (ref 70–99)
Potassium: 4.2 mmol/L (ref 3.5–5.2)
Sodium: 137 mmol/L (ref 134–144)
eGFR: 60 mL/min/{1.73_m2} (ref >59)

## 2023-08-20 LAB — CBC
Hematocrit: 41.2 % (ref 37.5–51.0)
Hemoglobin: 13.8 g/dL (ref 13.0–17.7)
MCH: 32.8 pg (ref 26.6–33.0)
MCHC: 33.5 g/dL (ref 31.5–35.7)
MCV: 98 fL — ABNORMAL HIGH (ref 79–97)
Platelets: 217 10*3/uL (ref 150–450)
RBC: 4.21 x10E6/uL (ref 4.14–5.80)
RDW: 13.7 % (ref 11.6–15.4)
WBC: 6.4 10*3/uL (ref 3.4–10.8)

## 2023-08-20 NOTE — Progress Notes (Signed)
 Called patient with pre-procedure instructions for tomorrow.   Patient informed of:   Time to arrive for procedure. 1245 Remain NPO past midnight.  Must have a ride home and a responsible adult to remain with them for 24 hours post procedure.  Confirmed blood thinner. Eliquis  Confirmed no breaks in taking blood thinner for 3+ weeks prior to procedure. Confirmed patient stopped all GLP-1s and GLP-2s for at least one week before procedure.

## 2023-08-21 ENCOUNTER — Ambulatory Visit (HOSPITAL_COMMUNITY): Admitting: Anesthesiology

## 2023-08-21 ENCOUNTER — Ambulatory Visit (HOSPITAL_COMMUNITY)
Admission: RE | Admit: 2023-08-21 | Discharge: 2023-08-21 | Disposition: A | Discharge status: Home / Self Care | Attending: Cardiology | Admitting: Cardiology

## 2023-08-21 ENCOUNTER — Other Ambulatory Visit: Payer: Self-pay

## 2023-08-21 ENCOUNTER — Encounter (HOSPITAL_COMMUNITY): Payer: Self-pay | Admitting: Cardiology

## 2023-08-21 ENCOUNTER — Encounter (HOSPITAL_COMMUNITY)
Admission: RE | Disposition: A | Discharge status: Home / Self Care | Payer: Self-pay | Source: Home / Self Care | Attending: Cardiology

## 2023-08-21 DIAGNOSIS — I4819 Other persistent atrial fibrillation: Secondary | ICD-10-CM | POA: Diagnosis not present

## 2023-08-21 DIAGNOSIS — K219 Gastro-esophageal reflux disease without esophagitis: Secondary | ICD-10-CM | POA: Insufficient documentation

## 2023-08-21 DIAGNOSIS — D6869 Other thrombophilia: Secondary | ICD-10-CM | POA: Diagnosis not present

## 2023-08-21 DIAGNOSIS — I10 Essential (primary) hypertension: Secondary | ICD-10-CM | POA: Diagnosis not present

## 2023-08-21 DIAGNOSIS — E785 Hyperlipidemia, unspecified: Secondary | ICD-10-CM | POA: Diagnosis not present

## 2023-08-21 DIAGNOSIS — Z79899 Other long term (current) drug therapy: Secondary | ICD-10-CM | POA: Diagnosis not present

## 2023-08-21 DIAGNOSIS — Z7901 Long term (current) use of anticoagulants: Secondary | ICD-10-CM | POA: Insufficient documentation

## 2023-08-21 DIAGNOSIS — I4891 Unspecified atrial fibrillation: Secondary | ICD-10-CM | POA: Diagnosis not present

## 2023-08-21 DIAGNOSIS — Z87891 Personal history of nicotine dependence: Secondary | ICD-10-CM | POA: Insufficient documentation

## 2023-08-21 HISTORY — PX: CARDIOVERSION: EP1203

## 2023-08-21 SURGERY — CARDIOVERSION (CATH LAB)
Anesthesia: General

## 2023-08-21 MED ORDER — SODIUM CHLORIDE 0.9% FLUSH: 3.0000 mL | Freq: Two times a day (BID) | INTRAVENOUS | Status: DC

## 2023-08-21 MED ORDER — SODIUM CHLORIDE 0.9% FLUSH: 3.0000 mL | INTRAVENOUS | Status: DC | PRN

## 2023-08-21 MED ORDER — PROPOFOL 10 MG/ML IV BOLUS
INTRAVENOUS | Status: DC | PRN
  Administered 2023-08-21: 70 mg via INTRAVENOUS
  Administered 2023-08-21 (×2): 30 mg via INTRAVENOUS
  Administered 2023-08-21: 70 mg via INTRAVENOUS

## 2023-08-21 MED ORDER — LIDOCAINE 2% (20 MG/ML) 5 ML SYRINGE
INTRAMUSCULAR | Status: DC | PRN
  Administered 2023-08-21: 100 mg via INTRAVENOUS

## 2023-08-21 SURGICAL SUPPLY — 1 items: PAD DEFIB RADIO PHYSIO CONN (PAD) ×1 IMPLANT

## 2023-08-21 NOTE — Anesthesia Preprocedure Evaluation (Addendum)
 Anesthesia Evaluation  Patient identified by MRN, date of birth, ID band Patient awake    Reviewed: Allergy & Precautions, NPO status , Patient's Chart, lab work & pertinent test results  Airway Mallampati: II  TM Distance: >3 FB Neck ROM: Full    Dental no notable dental hx.    Pulmonary former smoker   Pulmonary exam normal        Cardiovascular hypertension, Pt. on medications and Pt. on home beta blockers + dysrhythmias Atrial Fibrillation  Rhythm:Irregular Rate:Normal     Neuro/Psych negative neurological ROS  negative psych ROS   GI/Hepatic Neg liver ROS, PUD,GERD  Medicated,,  Endo/Other  negative endocrine ROS    Renal/GU negative Renal ROS  negative genitourinary   Musculoskeletal negative musculoskeletal ROS (+)    Abdominal Normal abdominal exam  (+)   Peds  Hematology Lab Results      Component                Value               Date                      WBC                      5.6                 12/26/2022                HGB                      13.7                12/26/2022                HCT                      41.9                12/26/2022                MCV                      101 (H)             12/26/2022                PLT                      227                 12/26/2022             Lab Results      Component                Value               Date                      NA                       139                 12/26/2022                K  4.1                 12/26/2022                CO2                      21                  12/26/2022                GLUCOSE                  115 (H)             12/26/2022                BUN                      18                  12/26/2022                CREATININE               1.10                12/26/2022                CALCIUM                  9.1                 12/26/2022                GFR                       60.44               02/12/2022                EGFR                     70                  12/26/2022                GFRNONAA                 77                  12/09/2019              Anesthesia Other Findings   Reproductive/Obstetrics                             Anesthesia Physical Anesthesia Plan  ASA: 3  Anesthesia Plan: General   Post-op Pain Management: Minimal or no pain anticipated   Induction: Intravenous  PONV Risk Score and Plan: 2 and Treatment may vary due to age or medical condition and Propofol  infusion  Airway Management Planned: Mask  Additional Equipment: None  Intra-op Plan:   Post-operative Plan:   Informed Consent: I have reviewed the patients History and Physical, chart, labs and discussed the procedure including the risks, benefits and alternatives for the proposed anesthesia with the patient or authorized representative who has indicated his/her understanding and acceptance.     Dental advisory given  Plan Discussed with: CRNA  Anesthesia Plan  Comments:        Anesthesia Quick Evaluation

## 2023-08-21 NOTE — Transfer of Care (Signed)
 Immediate Anesthesia Transfer of Care Note  Patient: Joseph Smith  Procedure(s) Performed: CARDIOVERSION  Patient Location: PACU  Anesthesia Type:MAC  Level of Consciousness: awake and drowsy  Airway & Oxygen Therapy: Patient Spontanous Breathing and Patient connected to nasal cannula oxygen  Post-op Assessment: Report given to RN and Post -op Vital signs reviewed and stable  Post vital signs: Reviewed and stable  Last Vitals:  Vitals Value Taken Time  BP    Temp    Pulse 79 08/21/23 12:50  Resp 21 08/21/23 12:50  SpO2 98 % 08/21/23 12:50  Vitals shown include unfiled device data.  Last Pain:  Vitals:   08/21/23 1227  TempSrc:   PainSc: 0-No pain         Complications: There were no known notable events for this encounter.

## 2023-08-21 NOTE — Interval H&P Note (Signed)
 History and Physical Interval Note:  08/21/2023 12:16 PM  Joseph Smith  has presented today for surgery, with the diagnosis of AFIB.  The various methods of treatment have been discussed with the patient and family. After consideration of risks, benefits and other options for treatment, the patient has consented to  Procedure(s): CARDIOVERSION (N/A) as a surgical intervention.  The patient's history has been reviewed, patient examined, no change in status, stable for surgery.  I have reviewed the patient's chart and labs.  Questions were answered to the patient's satisfaction.     Lonni LITTIE Nanas

## 2023-08-21 NOTE — Anesthesia Postprocedure Evaluation (Signed)
 Anesthesia Post Note  Patient: Joseph Smith  Procedure(s) Performed: CARDIOVERSION     Patient location during evaluation: PACU Anesthesia Type: General Level of consciousness: awake and alert Pain management: pain level controlled Vital Signs Assessment: post-procedure vital signs reviewed and stable Respiratory status: spontaneous breathing, nonlabored ventilation and respiratory function stable Cardiovascular status: blood pressure returned to baseline and stable Postop Assessment: no apparent nausea or vomiting Anesthetic complications: no   There were no known notable events for this encounter.  Last Vitals:  Vitals:   08/21/23 1315 08/21/23 1320  BP: 115/76 113/73  Pulse: 63 73  Resp: 17 17  Temp:    SpO2: 97% 96%    Last Pain:  Vitals:   08/21/23 1315  TempSrc:   PainSc: 0-No pain                 Butler Levander Pinal

## 2023-08-21 NOTE — CV Procedure (Signed)
 Procedure:   DCCV  Indication:  Symptomatic atrial fibrillation  Procedure Note:  The patient signed informed consent.  They have had had therapeutic anticoagulation with Eliquis  greater than 3 weeks.  Anesthesia was administered by Dr. Cleotilde and Catherene Schmitz, CRNA.  Adequate airway was maintained throughout and vital followed per protocol.  They were cardioverted x 1 with 200J of biphasic synchronized energy.  They converted to NSR with rate 70s.  There were no apparent complications.  The patient had normal neuro status and respiratory status post procedure with vitals stable as recorded elsewhere.    Follow up:  They will continue on current medical therapy and follow up with cardiology as scheduled.  Lonni Nanas, MD 08/21/2023 1:06 PM

## 2023-09-03 ENCOUNTER — Other Ambulatory Visit: Payer: Self-pay

## 2023-09-03 ENCOUNTER — Ambulatory Visit: Attending: Cardiology | Admitting: Cardiology

## 2023-09-03 ENCOUNTER — Encounter: Payer: Self-pay | Admitting: Cardiology

## 2023-09-03 VITALS — BP 132/84 | HR 85 | Ht 73.0 in | Wt 220.0 lb

## 2023-09-03 DIAGNOSIS — I4819 Other persistent atrial fibrillation: Secondary | ICD-10-CM

## 2023-09-03 DIAGNOSIS — I10 Essential (primary) hypertension: Secondary | ICD-10-CM | POA: Diagnosis not present

## 2023-09-03 DIAGNOSIS — D6869 Other thrombophilia: Secondary | ICD-10-CM

## 2023-09-03 NOTE — Patient Instructions (Signed)
 Medication Instructions:  Your physician recommends that you continue on your current medications as directed. Please refer to the Current Medication list given to you today.  *If you need a refill on your cardiac medications before your next appointment, please call your pharmacy*  Testing/Procedures: Ablation Your physician has recommended that you have an ablation. Catheter ablation is a medical procedure used to treat some cardiac arrhythmias (irregular heartbeats). During catheter ablation, a long, thin, flexible tube is put into a blood vessel in your groin (upper thigh), or neck. This tube is called an ablation catheter. It is then guided to your heart through the blood vessel. Radio frequency waves destroy small areas of heart tissue where abnormal heartbeats may cause an arrhythmia to start.   You are scheduled for Atrial Fibrillation Ablation on Tuesday, September 16 with Dr. Sidra Kitty.Please arrive at the Main Entrance A at [facility]: [address] [place], [place] [address] at 8:00 AM   What To Expect:  Labs: you will need to have lab work drawn within 30 days of your procedure. Please go to any [facility] location to have these drawn - no appointment is needed. You will receive procedure instructions either through MyChart or in the mail 4-6 week prior to your procedure.  After your procedure we recommend no driving for 3 days, no lifting over 10 lbs for 5 days, and no work or strenuous activity for 7 days.  Please contact our office at [phone] if you have any questions.    Follow-Up: We will contact you to schedule your post-procedure appointments.

## 2023-09-03 NOTE — Progress Notes (Signed)
 Electrophysiology Office Note:   Date:  09/05/2023  ID:  Joseph Smith, DOB 10-03-1945, MRN [id number]  Primary Cardiologist: Joseph Shallow, MD Electrophysiologist: Joseph Kitty, MD      History of Present Illness:   Joseph Smith is a 78 y.o. male with h/o HTN, HLD, atrial fibrillation who is being seen today for evaluation for catheter ablation.  Discussed the use of AI scribe software for clinical note transcription with the patient, who gave verbal consent to proceed.  History of Present Illness Joseph Smith is a 78 year old male with atrial fibrillation who presents for evaluation and management of his condition. He became persistent and underwent DCCV on 01/05/23. [facility] with recurrent afib. He underwent cardioversion on 08/21/23 but states that he only lasted in sinus rhythm for 4 days.  He is currently on metoprolol , which was initially effective in controlling his symptoms. He feels more fatigued and has less energy than before, impacting his daily activities. However, he is still able to engage in activities such as fishing and artwork. He is currently taking Eliquis  and metoprolol  and wants to avoid additional medications due to his current regimen.  His family history includes his wife's parents, who are both 26 years old and living in [place] , indicating a potential need for travel to assist with their care. He travels frequently to [place]  to assist with his wife's elderly parents. No shortness of breath, swelling in the legs, dizziness, or lightheadedness.  Review of systems complete and found to be negative unless listed in HPI.   EP Information / Studies Reviewed:    EKG is not ordered today. EKG from 08/21/23 reviewed which showed sinus rhythm.     EKG 08/19/23:    Echo 12/2022:  1. Left ventricular ejection fraction, by estimation, is 60 to 65%. The  left ventricle has normal function. The left ventricle has no regional  wall motion  abnormalities. Left ventricular diastolic parameters were  normal.   2. Right ventricular systolic function is normal. The right ventricular  size is normal.   3. Left atrial size was mildly dilated.   4. The mitral valve is normal in structure. Trivial mitral valve  regurgitation. No evidence of mitral stenosis.   5. The aortic valve is tricuspid. Aortic valve regurgitation is not  visualized. Aortic valve sclerosis is present, with no evidence of aortic  valve stenosis.   6. Aortic dilatation noted. There is mild dilatation of the aortic root,  measuring 40 mm. There is mild dilatation of the ascending aorta,  measuring 41 mm.   7. The inferior vena cava is normal in size with greater than 50%  respiratory variability, suggesting right atrial pressure of 3 mmHg.    Risk Assessment/Calculations:    CHA2DS2-VASc Score = 3   This indicates a 3.2% annual risk of stroke. The patient's score is based upon: CHF History: 0 HTN History: 1 Diabetes History: 0 Stroke History: 0 Vascular Disease History: 0 Age Score: 2 Gender Score: 0         Physical Exam:   VS:  BP 132/84   Pulse 85   Ht 6' 1 (1.854 m)   Wt 220 lb (99.8 kg)   SpO2 96%   BMI 29.03 kg/m    Wt Readings from Last 3 Encounters:  09/03/23 220 lb (99.8 kg)  08/19/23 219 lb 3.2 oz (99.4 kg)  04/08/23 221 lb 12.8 oz (100.6 kg)     GEN: Well nourished, well developed  in no acute distress NECK: No JVD CARDIAC: Normal rate, irregular rhythm.  RESPIRATORY:  Clear to auscultation without rales, wheezing or rhonchi  ABDOMEN: Soft, non-distended EXTREMITIES:  No edema; No deformity   ASSESSMENT AND PLAN:    #Persistent atrial fibrillation, symptomatic: Fatigued despite adequate rate control. Early recurrence after cardioversion. #Secondary hypercoagulable state due to AF:  -Discussed treatment options today for AF including antiarrhythmic drug therapy and ablation. Discussed risks, recovery and likelihood of  success with each treatment strategy. Risk, benefits, and alternatives to EP study and ablation for afib were discussed. These risks include but are not limited to stroke, bleeding, vascular damage, tamponade, perforation, damage to the esophagus, lungs, phrenic nerve and other structures, pulmonary vein stenosis, worsening renal function, coronary vasospasm and death.  Discussed potential need for repeat ablation procedures and antiarrhythmic drugs after an initial ablation. The patient understands these risk and wishes to proceed.  We will therefore proceed with catheter ablation at the next available time.  Carto, ICE, anesthesia are requested for the procedure.  Will also obtain CT PV protocol prior to the procedure to exclude LAA thrombus and further evaluate atrial anatomy. -We discussed AAD therapy as bridge to ablation. He is not interested currently. We will instead place him on cancellation list for procedure to be done earlier if possible.  -Continue Eliquis  5mg  BID.   #Hypertension -At goal today.  Recommend checking blood pressures 1-2 times per week at home and recording the values.  Recommend bringing these recordings to the primary care physician.   Follow up with Dr. Kennyth 3 months after ablation.   Signed, Joseph Kennyth, MD

## 2023-09-16 DIAGNOSIS — K5190 Ulcerative colitis, unspecified, without complications: Secondary | ICD-10-CM | POA: Diagnosis not present

## 2023-10-14 DIAGNOSIS — I4819 Other persistent atrial fibrillation: Secondary | ICD-10-CM | POA: Diagnosis not present

## 2023-10-15 ENCOUNTER — Ambulatory Visit: Payer: Self-pay | Admitting: *Deleted

## 2023-10-15 LAB — CBC
Hematocrit: 41.7 % (ref 37.5–51.0)
Hemoglobin: 14.2 g/dL (ref 13.0–17.7)
MCH: 34.1 pg — ABNORMAL HIGH (ref 26.6–33.0)
MCHC: 34.1 g/dL (ref 31.5–35.7)
MCV: 100 fL — ABNORMAL HIGH (ref 79–97)
Platelets: 217 x10E3/uL (ref 150–450)
RBC: 4.17 x10E6/uL (ref 4.14–5.80)
RDW: 12.3 % (ref 11.6–15.4)
WBC: 5.6 x10E3/uL (ref 3.4–10.8)

## 2023-10-15 LAB — BASIC METABOLIC PANEL WITH GFR
BUN/Creatinine Ratio: 19 (ref 10–24)
BUN: 22 mg/dL (ref 8–27)
CO2: 21 mmol/L (ref 20–29)
Calcium: 9.6 mg/dL (ref 8.6–10.2)
Chloride: 101 mmol/L (ref 96–106)
Creatinine, Ser: 1.13 mg/dL (ref 0.76–1.27)
Glucose: 113 mg/dL — AB (ref 70–99)
Potassium: 4.6 mmol/L (ref 3.5–5.2)
Sodium: 138 mmol/L (ref 134–144)
eGFR: 67 mL/min/1.73 (ref >59)

## 2023-10-28 DIAGNOSIS — K5190 Ulcerative colitis, unspecified, without complications: Secondary | ICD-10-CM | POA: Diagnosis not present

## 2023-11-03 ENCOUNTER — Telehealth: Payer: Self-pay

## 2023-11-03 DIAGNOSIS — Z7960 Long term (current) use of unspecified immunomodulators and immunosuppressants: Secondary | ICD-10-CM | POA: Diagnosis not present

## 2023-11-03 DIAGNOSIS — K5130 Ulcerative (chronic) rectosigmoiditis without complications: Secondary | ICD-10-CM | POA: Diagnosis not present

## 2023-11-03 NOTE — Telephone Encounter (Signed)
   Pre-operative Risk Assessment    Patient Name: Joseph Smith  DOB: 03/02/1945 MRN: [id number]   Date of last office visit: 09/03/23 FONDA KITTY, MD Date of next office visit: NONE   Request for Surgical Clearance    Procedure:  COLONOSCOPY  Date of Surgery:  Clearance 12/17/23                                Surgeon:  DR GERARDINE DANDY Surgeon's Group or Practice Name:  [facility] [place], [place] Phone number:  [phone] Fax number:  [phone]   Type of Clearance Requested:   - Medical  - Pharmacy:  Hold Apixaban  (Eliquis ) 2 DAYS PRIOR   Type of Anesthesia:  Not Indicated   Additional requests/questions:    Signed, Lucie DELENA Ku   11/03/2023, 5:23 PM

## 2023-11-04 NOTE — Telephone Encounter (Signed)
 Pharmacy please advise on holding Eliquis  for 2 days prior to colonoscopy  scheduled for 12/17/2023. Last labs (CBC and BMET) on 10/14/2023.  Thank you.

## 2023-11-05 ENCOUNTER — Telehealth: Payer: Self-pay | Admitting: *Deleted

## 2023-11-05 NOTE — Telephone Encounter (Signed)
 Pt has been scheduled tele preop appt 11/27/23. Med rec and consent are done.

## 2023-11-05 NOTE — Telephone Encounter (Signed)
   Name: Joseph Smith  DOB: [birth year]  MRN: [id number]  Primary Cardiologist: Redell Shallow, MD   Preoperative team, please contact this patient and set up a phone call appointment for further preoperative risk assessment. Please obtain consent and complete medication review. Thank you for your help.  I confirm that guidance regarding antiplatelet and oral anticoagulation therapy has been completed and, if necessary, noted below.  I also confirmed the patient resides in the state of [place] . As per [facility] Medical Board telemedicine laws, the patient must reside in the state in which the provider is licensed.   Jon Nat Hails, PA 11/05/2023, 9:22 AM [facility]

## 2023-11-05 NOTE — Telephone Encounter (Signed)
 Pt has been scheduled tele preop appt 11/27/23. Med rec and consent are done.     Patient Consent for Virtual Visit        Joseph Smith has provided verbal consent on 11/05/2023 for a virtual visit (video or telephone).   CONSENT FOR VIRTUAL VISIT FOR:  Joseph Smith  By participating in this virtual visit I agree to the following:  I hereby voluntarily request, consent and authorize [facility] and its employed or contracted physicians, physician assistants, nurse practitioners or other licensed health care professionals (the Practitioner), to provide me with telemedicine health care services (the "Services) as deemed necessary by the treating Practitioner. I acknowledge and consent to receive the Services by the Practitioner via telemedicine. I understand that the telemedicine visit will involve communicating with the Practitioner through live audiovisual communication technology and the disclosure of certain medical information by electronic transmission. I acknowledge that I have been given the opportunity to request an in-person assessment or other available alternative prior to the telemedicine visit and am voluntarily participating in the telemedicine visit.  I understand that I have the right to withhold or withdraw my consent to the use of telemedicine in the course of my care at any time, without affecting my right to future care or treatment, and that the Practitioner or I may terminate the telemedicine visit at any time. I understand that I have the right to inspect all information obtained and/or recorded in the course of the telemedicine visit and may receive copies of available information for a reasonable fee.  I understand that some of the potential risks of receiving the Services via telemedicine include:  Delay or interruption in medical evaluation due to technological equipment failure or disruption; Information transmitted may not be sufficient (e.g. poor  resolution of images) to allow for appropriate medical decision making by the Practitioner; and/or  In rare instances, security protocols could fail, causing a breach of personal health information.  Furthermore, I acknowledge that it is my responsibility to provide information about my medical history, conditions and care that is complete and accurate to the best of my ability. I acknowledge that Practitioner's advice, recommendations, and/or decision may be based on factors not within their control, such as incomplete or inaccurate data provided by me or distortions of diagnostic images or specimens that may result from electronic transmissions. I understand that the practice of medicine is not an exact science and that Practitioner makes no warranties or guarantees regarding treatment outcomes. I acknowledge that a copy of this consent can be made available to me via my patient portal [facility] MyChart), or I can request a printed copy by calling the office of [facility].    I understand that my insurance will be billed for this visit.   I have read or had this consent read to me. I understand the contents of this consent, which adequately explains the benefits and risks of the Services being provided via telemedicine.  I have been provided ample opportunity to ask questions regarding this consent and the Services and have had my questions answered to my satisfaction. I give my informed consent for the services to be provided through the use of telemedicine in my medical care

## 2023-11-05 NOTE — Telephone Encounter (Signed)
 Patient with diagnosis of atrial fibrillation on Eliquis  for anticoagulation.    Procedure: colonoscopy Date of procedure: 12/17/23   CHA2DS2-VASc Score = 3   This indicates a 3.2% annual risk of stroke. The patient's score is based upon: CHF History: 0 HTN History: 1 Diabetes History: 0 Stroke History: 0 Vascular Disease History: 0 Age Score: 2 Gender Score: 0    CrCl 77 Platelet count 217  Patient has not had an Afib/aflutter ablation or Watchman within the last 3 months or DCCV within the last 30 days   Per office protocol, patient can hold Eliquis  for 2 days prior to procedure.   Patient will not need bridging with Lovenox (enoxaparin) around procedure.  **This guidance is not considered finalized until pre-operative APP has relayed final recommendations.**

## 2023-11-06 DIAGNOSIS — M1711 Unilateral primary osteoarthritis, right knee: Secondary | ICD-10-CM | POA: Diagnosis not present

## 2023-11-09 ENCOUNTER — Telehealth: Payer: Self-pay

## 2023-11-09 NOTE — Telephone Encounter (Signed)
 I believe the 10/3 tele visit can clear for both.  Please not in the appt notes for that provider.

## 2023-11-09 NOTE — Telephone Encounter (Signed)
 Pt has Tele preop appt 11/27/23 at that time preop clearance x2 will be addressed  Will update the surgeons office

## 2023-11-09 NOTE — Pre-Procedure Instructions (Signed)
 Instructed patient on the following items: Arrival time 0800 Nothing to eat or drink after midnight No meds AM of procedure Responsible person to drive you home and stay with you for 24 hrs  Have you missed any doses of anti-coagulant Eliquis- takes twice a day, hasn't missed any doses in last 4 weeks.  Don't take dose morning of procedure.

## 2023-11-09 NOTE — Telephone Encounter (Signed)
 Clarification: Pt has two clearances that are being processed.  11/03/23 encounter: Colonoscopy on 12/17/23 (patient has TELE on 11/27/23 for this clearance.  11/09/23 encounter is the 2nd clearance request that needs to be processed.: Right Total Knee Arthroplasty on TBD  Will route to the preop pool for the Preop APP to review

## 2023-11-09 NOTE — Telephone Encounter (Signed)
 NOTE: This will be the 2nd preop clearance request for pt. 1st preop clearance is on encounter 11/03/23 with a procedure date of 12/17/23  Patient is scheduled for a TELE Preop appt 11/27/23.      Pre-operative Risk Assessment    Patient Name: Joseph Smith  DOB: October 20, 1945 MRN: [id number]   Date of last office visit: 09/03/23 FONDA KITTY, MD Date of next office visit: 02/10/24 CHARLIES ARTHUR, PA-C   Request for Surgical Clearance    Procedure:  RIGHT TOTAL KNEE ARTHROPLASTY  Date of Surgery:  Clearance TBD                                Surgeon:  FONDA OLMSTED, MD Surgeon's Group or Practice Name:  [facility] Phone number:  [phone]  EXT 3132 Fax number:  [phone]  ATTN: MAEOLA DIVERS   Type of Clearance Requested:   - Medical  - Pharmacy:  Hold Apixaban  (Eliquis )     Type of Anesthesia:  Spinal   Additional requests/questions:    SignedLucie DELENA Ku   11/09/2023, 8:46 AM

## 2023-11-09 NOTE — Telephone Encounter (Signed)
 Since this is the second request, please reach out to the requesting office to clarify what they need. Pt has tele visit on 11/27/23. If procedure is moved up, we may need to move up the tele visit.   Thanks  Angie

## 2023-11-10 ENCOUNTER — Ambulatory Visit (HOSPITAL_COMMUNITY)
Admission: RE | Admit: 2023-11-10 | Discharge: 2023-11-10 | Disposition: A | Discharge status: Home / Self Care | Attending: Cardiology | Admitting: Cardiology

## 2023-11-10 ENCOUNTER — Other Ambulatory Visit: Payer: Self-pay

## 2023-11-10 ENCOUNTER — Ambulatory Visit (HOSPITAL_COMMUNITY): Admitting: Anesthesiology

## 2023-11-10 ENCOUNTER — Encounter (HOSPITAL_COMMUNITY)
Admission: RE | Disposition: A | Discharge status: Home / Self Care | Payer: Self-pay | Source: Home / Self Care | Attending: Cardiology

## 2023-11-10 DIAGNOSIS — I4819 Other persistent atrial fibrillation: Secondary | ICD-10-CM

## 2023-11-10 DIAGNOSIS — D6869 Other thrombophilia: Secondary | ICD-10-CM | POA: Insufficient documentation

## 2023-11-10 DIAGNOSIS — Z79899 Other long term (current) drug therapy: Secondary | ICD-10-CM | POA: Diagnosis not present

## 2023-11-10 DIAGNOSIS — Z7901 Long term (current) use of anticoagulants: Secondary | ICD-10-CM | POA: Diagnosis not present

## 2023-11-10 DIAGNOSIS — I10 Essential (primary) hypertension: Secondary | ICD-10-CM | POA: Diagnosis not present

## 2023-11-10 DIAGNOSIS — Z87891 Personal history of nicotine dependence: Secondary | ICD-10-CM | POA: Insufficient documentation

## 2023-11-10 DIAGNOSIS — K219 Gastro-esophageal reflux disease without esophagitis: Secondary | ICD-10-CM | POA: Insufficient documentation

## 2023-11-10 HISTORY — PX: ATRIAL FIBRILLATION ABLATION: EP1191

## 2023-11-10 MED ORDER — PHENYLEPHRINE 80 MCG/ML (10ML) SYRINGE FOR IV PUSH (FOR BLOOD PRESSURE SUPPORT)
PREFILLED_SYRINGE | INTRAVENOUS | Status: DC | PRN
  Administered 2023-11-10: 240 ug via INTRAVENOUS
  Administered 2023-11-10: 80 ug via INTRAVENOUS
  Administered 2023-11-10: 160 ug via INTRAVENOUS

## 2023-11-10 MED ORDER — SODIUM CHLORIDE 0.9 % IV SOLN: INTRAVENOUS | Status: DC

## 2023-11-10 MED ORDER — ATROPINE SULFATE 1 MG/10ML IJ SOSY
PREFILLED_SYRINGE | INTRAMUSCULAR | Status: DC | PRN
  Administered 2023-11-10: 1 mg via INTRAVENOUS

## 2023-11-10 MED ORDER — ROCURONIUM BROMIDE 10 MG/ML (PF) SYRINGE
PREFILLED_SYRINGE | INTRAVENOUS | Status: DC | PRN
  Administered 2023-11-10: 20 mg via INTRAVENOUS
  Administered 2023-11-10: 50 mg via INTRAVENOUS
  Administered 2023-11-10: 30 mg via INTRAVENOUS
  Administered 2023-11-10: 50 mg via INTRAVENOUS

## 2023-11-10 MED ORDER — PHENYLEPHRINE HCL-NACL 20-0.9 MG/250ML-% IV SOLN
INTRAVENOUS | Status: DC | PRN
  Administered 2023-11-10: 40 ug/min via INTRAVENOUS

## 2023-11-10 MED ORDER — APIXABAN 5 MG PO TABS
5.0000 mg | ORAL_TABLET | Freq: Once | ORAL | Status: AC
  Administered 2023-11-10: 5 mg via ORAL
  Filled 2023-11-10: qty 1

## 2023-11-10 MED ORDER — ACETAMINOPHEN 325 MG PO TABS: 650.0000 mg | ORAL_TABLET | ORAL | Status: DC | PRN

## 2023-11-10 MED ORDER — PROPOFOL 10 MG/ML IV BOLUS
INTRAVENOUS | Status: DC | PRN
  Administered 2023-11-10: 150 mg via INTRAVENOUS

## 2023-11-10 MED ORDER — SODIUM CHLORIDE 0.9 % IV SOLN: 250.0000 mL | INTRAVENOUS | Status: DC | PRN

## 2023-11-10 MED ORDER — PROTAMINE SULFATE 10 MG/ML IV SOLN
INTRAVENOUS | Status: DC | PRN
Start: 2023-11-10 — End: 2023-11-10
  Administered 2023-11-10: 30 mg via INTRAVENOUS
  Administered 2023-11-10: 10 mg via INTRAVENOUS

## 2023-11-10 MED ORDER — ONDANSETRON HCL 4 MG/2ML IJ SOLN
INTRAMUSCULAR | Status: DC | PRN
  Administered 2023-11-10: 4 mg via INTRAVENOUS

## 2023-11-10 MED ORDER — HEPARIN SODIUM (PORCINE) 1000 UNIT/ML IJ SOLN
INTRAMUSCULAR | Status: DC | PRN
  Administered 2023-11-10: 15000 [IU] via INTRAVENOUS
  Administered 2023-11-10: 4000 [IU] via INTRAVENOUS

## 2023-11-10 MED ORDER — HEPARIN (PORCINE) IN NACL 1000-0.9 UT/500ML-% IV SOLN
INTRAVENOUS | Status: DC | PRN
  Administered 2023-11-10 (×3): 500 mL

## 2023-11-10 MED ORDER — SUGAMMADEX SODIUM 200 MG/2ML IV SOLN
INTRAVENOUS | Status: DC | PRN
  Administered 2023-11-10: 200 mg via INTRAVENOUS

## 2023-11-10 MED ORDER — ACETAMINOPHEN 500 MG PO TABS
1000.0000 mg | ORAL_TABLET | Freq: Once | ORAL | Status: AC
Start: 2023-11-10 — End: 2023-11-10
  Administered 2023-11-10: 1000 mg via ORAL
  Filled 2023-11-10: qty 2

## 2023-11-10 MED ORDER — ONDANSETRON HCL 4 MG/2ML IJ SOLN: 4.0000 mg | Freq: Four times a day (QID) | INTRAMUSCULAR | Status: DC | PRN

## 2023-11-10 MED ORDER — SODIUM CHLORIDE 0.9% FLUSH: 3.0000 mL | INTRAVENOUS | Status: DC | PRN

## 2023-11-10 MED ORDER — SODIUM CHLORIDE 0.9% FLUSH
3.0000 mL | Freq: Two times a day (BID) | INTRAVENOUS | Status: DC
Start: 2023-11-10 — End: 2023-11-10

## 2023-11-10 MED ORDER — DEXAMETHASONE SODIUM PHOSPHATE 10 MG/ML IJ SOLN
INTRAMUSCULAR | Status: DC | PRN
  Administered 2023-11-10: 4 mg via INTRAVENOUS

## 2023-11-10 NOTE — Transfer of Care (Signed)
 Immediate Anesthesia Transfer of Care Note  Patient: Joseph Smith  Procedure(s) Performed: ATRIAL FIBRILLATION ABLATION  Patient Location: Cath Lab  Anesthesia Type:General  Level of Consciousness: awake, alert , and oriented  Airway & Oxygen Therapy: Patient Spontanous Breathing  Post-op Assessment: Report given to RN and Post -op Vital signs reviewed and stable  Post vital signs: Reviewed and stable  Last Vitals:  Vitals Value Taken Time  BP    Temp    Pulse    Resp    SpO2      Last Pain:  Vitals:   11/10/23 0814  PainSc: 0-No pain         Complications: No notable events documented.

## 2023-11-10 NOTE — Progress Notes (Signed)
 Patient and wife was given discharge instructions. Both verbalized understanding.

## 2023-11-10 NOTE — H&P (Signed)
 Electrophysiology Note:   Date:  11/10/23  ID:  Joseph Smith, DOB 25-Aug-1945, MRN [id number]   Primary Cardiologist: Redell Shallow, MD Electrophysiologist: Fonda Kitty, MD       History of Present Illness:   Joseph Smith is a 78 y.o. male with h/o HTN, HLD, atrial fibrillation who is being seen today for evaluation for catheter ablation.   Discussed the use of AI scribe software for clinical note transcription with the patient, who gave verbal consent to proceed.   History of Present Illness Joseph Smith is a 78 year old male with atrial fibrillation who presents for evaluation and management of his condition. He became persistent and underwent DCCV on 01/05/23. [facility] 08/18/23 with recurrent afib. He underwent cardioversion on 08/21/23 but states that he only lasted in sinus rhythm for 4 days.  He is currently on metoprolol , which was initially effective in controlling his symptoms. He feels more fatigued and has less energy than before, impacting his daily activities. However, he is still able to engage in activities such as fishing and artwork. He is currently taking Eliquis  and metoprolol  and wants to avoid additional medications due to his current regimen.   His family history includes his wife's parents, who are both 3 years old and living in [place] , indicating a potential need for travel to assist with their care. He travels frequently to [place]  to assist with his wife's elderly parents. No shortness of breath, swelling in the legs, dizziness, or lightheadedness.   Interval: Patient presents today for planned ablation. Reports feeling relatively well. No new or acute complaints.  Review of systems complete and found to be negative unless listed in HPI.    EP Information / Studies Reviewed:     EKG is not ordered today. EKG from 08/21/23 reviewed which showed sinus rhythm.      EKG 08/19/23:     Echo 12/2022:  1. Left ventricular ejection fraction, by  estimation, is 60 to 65%. The  left ventricle has normal function. The left ventricle has no regional  wall motion abnormalities. Left ventricular diastolic parameters were  normal.   2. Right ventricular systolic function is normal. The right ventricular  size is normal.   3. Left atrial size was mildly dilated.   4. The mitral valve is normal in structure. Trivial mitral valve  regurgitation. No evidence of mitral stenosis.   5. The aortic valve is tricuspid. Aortic valve regurgitation is not  visualized. Aortic valve sclerosis is present, with no evidence of aortic  valve stenosis.   6. Aortic dilatation noted. There is mild dilatation of the aortic root,  measuring 40 mm. There is mild dilatation of the ascending aorta,  measuring 41 mm.   7. The inferior vena cava is normal in size with greater than 50%  respiratory variability, suggesting right atrial pressure of 3 mmHg.      Risk Assessment/Calculations:     CHA2DS2-VASc Score = 3   This indicates a 3.2% annual risk of stroke. The patient's score is based upon: CHF History: 0 HTN History: 1 Diabetes History: 0 Stroke History: 0 Vascular Disease History: 0 Age Score: 2 Gender Score: 0           Physical Exam:    Today's Vitals   11/10/23 0805 11/10/23 0814  BP: (!) 158/98   Pulse: (!) 110   Temp: 98.3 F (36.8 C)   SpO2: 100%   Weight: 99.3 kg   Height: 6' 1 (  1.854 m)   PainSc:  0-No pain   Body mass index is 28.89 kg/m.  GEN: Well nourished, well developed in no acute distress NECK: No JVD CARDIAC: Tachycardic, irregular rhythm.  RESPIRATORY:  Clear to auscultation without rales, wheezing or rhonchi  ABDOMEN: Soft, non-distended EXTREMITIES:  No edema; No deformity    ASSESSMENT AND PLAN:     #Persistent atrial fibrillation, symptomatic: Fatigued despite adequate rate control. Early recurrence after cardioversion. #Secondary hypercoagulable state due to AF:  -Discussed treatment options today for  AF including antiarrhythmic drug therapy and ablation. Discussed risks, recovery and likelihood of success with each treatment strategy. Risk, benefits, and alternatives to EP study and ablation for afib were discussed. These risks include but are not limited to stroke, bleeding, vascular damage, tamponade, perforation, damage to the esophagus, lungs, phrenic nerve and other structures, pulmonary vein stenosis, worsening renal function, coronary vasospasm and death.  Discussed potential need for repeat ablation procedures and antiarrhythmic drugs after an initial ablation. The patient understands these risk and wishes to proceed.  We will therefore proceed with catheter ablation today. -Continue Eliquis  5mg  BID.    #Hypertension -At goal today.  Recommend checking blood pressures 1-2 times per week at home and recording the values.  Recommend bringing these recordings to the primary care physician.     Follow up with Dr. Kennyth 3 months after ablation.    Signed, Fonda Kennyth, MD

## 2023-11-10 NOTE — Discharge Instructions (Signed)

## 2023-11-10 NOTE — Anesthesia Procedure Notes (Signed)
 Procedure Name: Intubation Date/Time: 11/10/2023 8:47 AM  Performed by: Lockie Flesher, CRNAPre-anesthesia Checklist: Patient identified, Emergency Drugs available, Suction available and Patient being monitored Patient Re-evaluated:Patient Re-evaluated prior to induction Oxygen Delivery Method: Circle System Utilized Preoxygenation: Pre-oxygenation with 100% oxygen Induction Type: IV induction Ventilation: Mask ventilation without difficulty Laryngoscope Size: Mac and 4 Grade View: Grade II Tube type: Oral Tube size: 7.5 mm Number of attempts: 1 Airway Equipment and Method: Stylet and Oral airway Placement Confirmation: ETT inserted through vocal cords under direct vision, positive ETCO2 and breath sounds checked- equal and bilateral Secured at: 22 cm Tube secured with: Tape Dental Injury: Teeth and Oropharynx as per pre-operative assessment

## 2023-11-10 NOTE — Anesthesia Postprocedure Evaluation (Signed)
 Anesthesia Post Note  Patient: Joseph Smith  Procedure(s) Performed: ATRIAL FIBRILLATION ABLATION     Patient location during evaluation: PACU Anesthesia Type: General Level of consciousness: awake and alert Pain management: pain level controlled Vital Signs Assessment: post-procedure vital signs reviewed and stable Respiratory status: spontaneous breathing, nonlabored ventilation and respiratory function stable Cardiovascular status: stable and blood pressure returned to baseline Anesthetic complications: no   No notable events documented.  Last Vitals:  Vitals:   11/10/23 1155 11/10/23 1200  BP:  121/78  Pulse: 68 72  Resp: 13 15  Temp:    SpO2: 98% 95%    Last Pain:  Vitals:   11/10/23 1055  TempSrc: Oral  PainSc:                  Debby FORBES Like

## 2023-11-10 NOTE — Anesthesia Preprocedure Evaluation (Addendum)
 Anesthesia Evaluation  Patient identified by MRN, date of birth, ID band Patient awake    Reviewed: Allergy & Precautions, NPO status , Patient's Chart, lab work & pertinent test results, reviewed documented beta blocker date and time   History of Anesthesia Complications Negative for: history of anesthetic complications  Airway Mallampati: III  TM Distance: >3 FB Neck ROM: Full    Dental  (+) Dental Advisory Given, Teeth Intact   Pulmonary former smoker   Pulmonary exam normal        Cardiovascular hypertension, Pt. on medications and Pt. on home beta blockers Normal cardiovascular exam+ dysrhythmias Atrial Fibrillation    '24 TTE - EF 60 to 65%. Left atrial size was mildly dilated. Trivial mitral valve regurgitation. There is mild dilatation of the aortic root, measuring 40 mm. There is mild dilatation of the ascending aorta, measuring 41 mm.     Neuro/Psych negative neurological ROS  negative psych ROS   GI/Hepatic Neg liver ROS, PUD,GERD  Medicated and Controlled,, UC    Endo/Other  negative endocrine ROS    Renal/GU negative Renal ROS     Musculoskeletal negative musculoskeletal ROS (+)    Abdominal   Peds  Hematology  On eliquis     Anesthesia Other Findings   Reproductive/Obstetrics                              Anesthesia Physical Anesthesia Plan  ASA: 3  Anesthesia Plan: General   Post-op Pain Management: Tylenol  PO (pre-op)*   Induction: Intravenous  PONV Risk Score and Plan: 2 and Treatment may vary due to age or medical condition, Ondansetron  and Dexamethasone   Airway Management Planned: Oral ETT  Additional Equipment: None  Intra-op Plan:   Post-operative Plan: Extubation in OR  Informed Consent: I have reviewed the patients History and Physical, chart, labs and discussed the procedure including the risks, benefits and alternatives for the proposed anesthesia  with the patient or authorized representative who has indicated his/her understanding and acceptance.     Dental advisory given  Plan Discussed with: CRNA and Anesthesiologist  Anesthesia Plan Comments:          Anesthesia Quick Evaluation

## 2023-11-11 ENCOUNTER — Encounter (HOSPITAL_COMMUNITY): Payer: Self-pay | Admitting: Cardiology

## 2023-11-11 ENCOUNTER — Telehealth (HOSPITAL_COMMUNITY): Payer: Self-pay

## 2023-11-11 LAB — POCT ACTIVATED CLOTTING TIME: Activated Clotting Time: 389 s

## 2023-11-11 NOTE — Telephone Encounter (Signed)
 Spoke with patient to complete post procedure follow up call.  Patient reports no complications with groin sites.   Instructions reviewed with patient:  Remove large bandage at puncture site after 24 hours. It is normal to have bruising, tenderness, mild swelling, and a pea or marble sized lump/knot at the groin site which can take up to three months to resolve.  Get help right away if you notice sudden swelling at the puncture site.  Check your puncture site every day for signs of infection: fever, redness, swelling, pus drainage, warmth, foul odor or excessive pain. If this occurs, please call [phone], to speak with the RN Navigator. Get help right away if your puncture site is bleeding and the bleeding does not stop after applying firm pressure to the area.  You may continue to have skipped beats/ atrial fibrillation during the first several months after your procedure.  It is very important not to miss any doses of your blood thinner Eliquis .    You will follow up with the Afib clinic on 12/08/23 and follow up with the APP on 02/10/24.    Patient verbalized understanding to all instructions provided.

## 2023-11-11 NOTE — Telephone Encounter (Signed)
 Attempted to reach patient to follow up with procedure completed on 11/10/23, no answer. Left VM for patient to return call.

## 2023-11-18 NOTE — Telephone Encounter (Signed)
 2nd request received.  Will update the surgeons office that the patient has TELE Preop appt 11/27/23

## 2023-11-26 NOTE — Progress Notes (Unsigned)
 Virtual Visit via Telephone Note   Because of Joseph Smith co-morbid illnesses, he is at least at moderate risk for complications without adequate follow up.  This format is felt to be most appropriate for this patient at this time.  Due to technical limitations with video connection (technology), today's appointment will be conducted as an audio only telehealth visit, and Joseph Smith verbally agreed to proceed in this manner.   All issues noted in this document were discussed and addressed.  No physical exam could be performed with this format.  Evaluation Performed:  Preoperative cardiovascular risk assessment _____________   Date:  11/26/2023   Patient ID:  Joseph Smith, DOB 10-23-1945, MRN [id number] Patient Location:  Home Provider location:   Office  Primary Care Provider:  Pietro Redell RAMAN, MD Primary Cardiologist:  Redell Pietro, MD  Chief Complaint / Patient Profile   78 y.o. y/o male with a h/o  HTN, HLD, atrial fibrillation s/p AF ablation who is pending right TKA and colonoscopy who presents today for telephonic preoperative cardiovascular risk assessment.  History of Present Illness    Joseph Smith is a 78 y.o. male who presents via audio/video conferencing for a telehealth visit today.  Pt was last seen in cardiology clinic on 09/03/2023 by Dr. Kennyth and was last seen by Dr. Pietro on 04/08/2023.  At that time PIERCE BAROCIO was doing well and had undergone DCCV on 08/21/2023.  AF ablation on 11/10/2023 the patient is now pending procedure as outlined above. Since his last visit, he ***  *** denies chest pain, shortness of breath, lower extremity edema, fatigue, palpitations, melena, hematuria, hemoptysis, diaphoresis, weakness, presyncope, syncope, orthopnea, and PND.    Per office protocol, patient can hold Eliquis  for 2 days prior to procedure. Patient will not need bridging with Lovenox (enoxaparin) around procedure.   Past Medical History    Past  Medical History:  Diagnosis Date   Atrial fibrillation (HCC)    Chest pain, unspecified    Esophageal reflux    Family history of ischemic heart disease    Other and unspecified hyperlipidemia    Palpitations    Ulcerative colitis    Unspecified essential hypertension    Past Surgical History:  Procedure Laterality Date   ATRIAL FIBRILLATION ABLATION N/A 11/10/2023   Procedure: ATRIAL FIBRILLATION ABLATION;  Surgeon: Kennyth Chew, MD;  Location: [facility] INVASIVE CV LAB;  Service: Cardiovascular;  Laterality: N/A;   CARDIOVERSION N/A 01/05/2023   Procedure: CARDIOVERSION;  Surgeon: Lonni Slain, MD;  Location[facility] INVASIVE CV LAB;  Service: Cardiovascular;  Laterality: N/A;   CARDIOVERSION N/A 08/21/2023   Procedure: CARDIOVERSION;  Surgeon: Kate Lonni CROME, MD;  Location: [facility] INVASIVE CV LAB;  Service: Cardiovascular;  Laterality: N/A;   ELBOW SURGERY     left   KNEE ARTHROSCOPY     rihgt   VASECTOMY      Allergies  Allergies  Allergen Reactions   Codeine Itching    Home Medications    Prior to Admission medications   Medication Sig Start Date End Date Taking? Authorizing Provider  acetaminophen  (TYLENOL ) 500 MG tablet Take 1,000 mg by mouth every 6 (six) hours as needed for moderate pain (pain score 4-6).    [provider]  amLODipine  (NORVASC ) 10 MG tablet TAKE 1 TABLET(10 MG) BY MOUTH DAILY 06/30/22   Levora Reyes JONELLE, MD  apixaban  (ELIQUIS ) 5 MG TABS tablet Take 1 tablet (5 mg total) by mouth 2 (two) times daily.  06/24/23   Pietro Redell RAMAN, MD  benazepril  (LOTENSIN ) 40 MG tablet Take 1 tablet (40 mg total) by mouth daily. 08/06/23   Pietro Redell RAMAN, MD  metoprolol  succinate (TOPROL -XL) 100 MG 24 hr tablet Take 1 tablet (100 mg total) by mouth daily. Patient taking differently: Take 100 mg by mouth See admin instructions. Take with 25 mg for a total of 125 mg in the morning 12/05/22   Pietro Redell RAMAN, MD  Naphazoline-Pheniramine (VISINE OP) Place  1 drop into both eyes daily.    [provider]  omeprazole  (PRILOSEC) 40 MG capsule Take 1 capsule (40 mg total) by mouth daily. 02/12/22   Levora Reyes SAUNDERS, MD  pravastatin  (PRAVACHOL ) 80 MG tablet Take 80 mg by mouth daily.    [provider]  triamcinolone  cream (KENALOG ) 0.1 % Apply topically 2 (two) times daily as needed. Patient taking differently: Apply 1 Application topically 2 (two) times daily as needed (Rash). 02/12/22   Levora Reyes SAUNDERS, MD    Physical Exam    Vital Signs:  Ozell SAUNDERS Gavel does not have vital signs available for review today.***  Given telephonic nature of communication, physical exam is limited. AAOx3. NAD. Normal affect.  Speech and respirations are unlabored.  Accessory Clinical Findings    None  Assessment & Plan    1.  Preoperative Cardiovascular Risk Assessment:  The patient was advised that if he develops new symptoms prior to surgery to contact our office to arrange for a follow-up visit, and he verbalized understanding.  (Reminder: Include SBE prophylaxis/Antiplatelet/Anticoag Instructions***)  A copy of this note will be routed to requesting surgeon.  Time:   Today, I have spent *** minutes with the patient with telehealth technology discussing medical history, symptoms, and management plan.     Wyn Raddle, Jackee Shove, NP  11/26/2023, 5:08 PM

## 2023-11-27 ENCOUNTER — Ambulatory Visit: Attending: Cardiology

## 2023-11-27 DIAGNOSIS — Z01810 Encounter for preprocedural cardiovascular examination: Secondary | ICD-10-CM

## 2023-11-27 NOTE — Telephone Encounter (Signed)
 Called GI office with an update per preop APP Jackee Alberts, NP and Dr. Kennyth cardiologist pt had a-fib ablation 11/10/23 and cannot hold blood thinners for 60 days post ablation. Procedure will need to be post poned until pt can be cleared by cardiology.   Our plan is to reschedule the pt late Nov 2025 for a tele preop appt to re-address clearance.

## 2023-11-27 NOTE — Telephone Encounter (Signed)
 I left a message for surgery scheduler Maeola Divers, per notes from preop APP and Dr. Kennyth the pt has recent a-fib ablation 11/10/23 and cannot hold blood thinners x 60 days post ablation.   Pt is aware that we will reschedule his tele preop appt for late Nov 2025. Surgery will need to be post poned though until the pt has been cleared.    I will fax these notes to Dr. Josefina and his surgery scheduler.

## 2023-11-27 NOTE — Progress Notes (Signed)
 I left a message for the pt to call back so that we may reschedule his tele preop appt to late Nov 2025. See notes from Jackee Alberts, NP.

## 2023-12-01 ENCOUNTER — Other Ambulatory Visit: Payer: Self-pay | Admitting: Cardiology

## 2023-12-02 ENCOUNTER — Other Ambulatory Visit: Payer: Self-pay | Admitting: Cardiology

## 2023-12-02 DIAGNOSIS — E782 Mixed hyperlipidemia: Secondary | ICD-10-CM | POA: Diagnosis not present

## 2023-12-02 DIAGNOSIS — I480 Paroxysmal atrial fibrillation: Secondary | ICD-10-CM

## 2023-12-02 NOTE — Telephone Encounter (Signed)
 Eliquis  5mg  refill request received. Patient is 78 years old, weight-99.3kg, Crea-1.13 on 10/14/23, Diagnosis-Afib, and last seen by Dr. Kennyth on 09/03/23. Dose is appropriate based on dosing criteria. Will send in refill to requested pharmacy.

## 2023-12-03 MED ORDER — METOPROLOL SUCCINATE ER 100 MG PO TB24: 100.0000 mg | ORAL_TABLET | Freq: Every day | ORAL | 1 refills | Status: AC

## 2023-12-08 ENCOUNTER — Ambulatory Visit (HOSPITAL_COMMUNITY): Admitting: Physician Assistant

## 2023-12-10 DIAGNOSIS — K5130 Ulcerative (chronic) rectosigmoiditis without complications: Secondary | ICD-10-CM | POA: Diagnosis not present

## 2023-12-22 ENCOUNTER — Ambulatory Visit (HOSPITAL_COMMUNITY)
Admission: RE | Admit: 2023-12-22 | Discharge: 2023-12-22 | Disposition: A | Discharge status: Home / Self Care | Source: Ambulatory Visit | Attending: Physician Assistant | Admitting: Physician Assistant

## 2023-12-22 VITALS — BP 142/80 | HR 55 | Ht 73.0 in | Wt 223.2 lb

## 2023-12-22 DIAGNOSIS — I4819 Other persistent atrial fibrillation: Secondary | ICD-10-CM

## 2023-12-22 DIAGNOSIS — Z01810 Encounter for preprocedural cardiovascular examination: Secondary | ICD-10-CM

## 2023-12-22 DIAGNOSIS — D6869 Other thrombophilia: Secondary | ICD-10-CM | POA: Diagnosis not present

## 2023-12-22 DIAGNOSIS — I4891 Unspecified atrial fibrillation: Secondary | ICD-10-CM | POA: Diagnosis not present

## 2023-12-22 NOTE — Progress Notes (Signed)
 Primary Care Physician: Pietro Redell RAMAN, MD Primary Cardiologist: Redell Pietro, MD Electrophysiologist: Fonda Kitty, MD  Referring Physician: Dr Pietro Ozell JONELLE Joseph Smith is a 78 y.o. male with a history of HTN, HLD, atrial fibrillation who presents for follow up in the [facility].  The patient was initially diagnosed with atrial fibrillation remotely. He became persistent and underwent DCCV on 01/05/23. Patient is on Eliquis  for stroke prevention. He called [facility] 08/18/23 with recurrent afib and had DCCV on 08/21/23. He was seen by Dr Kitty and underwent afib ablation on 11/10/23.  Patient returns for follow up for atrial fibrillation. He reports that he only had one very brief episode of tachypalpitations lasting about 10 seconds. Otherwise, he has been maintaining SR. No bleeding issues on anticoagulation. He denies chest pain or groin issues.   Today, he  denies symptoms of palpitations, chest pain, shortness of breath, orthopnea, PND, lower extremity edema, dizziness, presyncope, syncope, snoring, daytime somnolence, bleeding, or neurologic sequela. The patient is tolerating medications without difficulties and is otherwise without complaint today.    Atrial Fibrillation Risk Factors:  he does not have symptoms or diagnosis of sleep apnea. he does not have a history of rheumatic fever.   Atrial Fibrillation Management history:  Previous antiarrhythmic drugs: none Previous cardioversions: 01/05/23, 08/21/23 Previous ablations: none Anticoagulation history: Eliquis   ROS- All systems are reviewed and negative except as per the HPI above.  Past Medical History:  Diagnosis Date   Atrial fibrillation (HCC)    Chest pain, unspecified    Esophageal reflux    Family history of ischemic heart disease    Other and unspecified hyperlipidemia    Palpitations    Ulcerative colitis    Unspecified essential hypertension     Current Outpatient  Medications  Medication Sig Dispense Refill   acetaminophen  (TYLENOL ) 500 MG tablet Take 1,000 mg by mouth every 6 (six) hours as needed for moderate pain (pain score 4-6). (Patient taking differently: Take 1,000 mg by mouth as needed for moderate pain (pain score 4-6).)     amLODipine  (NORVASC ) 10 MG tablet TAKE 1 TABLET(10 MG) BY MOUTH DAILY 30 tablet 3   benazepril  (LOTENSIN ) 40 MG tablet Take 1 tablet (40 mg total) by mouth daily. 90 tablet 3   ELIQUIS  5 MG TABS tablet TAKE 1 TABLET(5 MG) BY MOUTH TWICE DAILY 180 tablet 1   metoprolol  succinate (TOPROL -XL) 100 MG 24 hr tablet Take 1 tablet (100 mg total) by mouth daily. 90 tablet 1   Naphazoline-Pheniramine (VISINE OP) Place 1 drop into both eyes daily.     omeprazole  (PRILOSEC) 40 MG capsule Take 1 capsule (40 mg total) by mouth daily. 90 capsule 3   pravastatin  (PRAVACHOL ) 80 MG tablet Take 80 mg by mouth daily.     triamcinolone  cream (KENALOG ) 0.1 % Apply topically 2 (two) times daily as needed. 80 g 1   No current facility-administered medications for this encounter.    Physical Exam: BP (!) 142/80   Pulse (!) 55   Ht 6' 1 (1.854 m)   Wt 101.2 kg   BMI 29.45 kg/m   GEN: Well nourished, well developed in no acute distress CARDIAC: Regular rate and rhythm, no murmurs, rubs, gallops RESPIRATORY:  Clear to auscultation without rales, wheezing or rhonchi  ABDOMEN: Soft, non-tender, non-distended EXTREMITIES:  No edema; No deformity    Wt Readings from Last 3 Encounters:  12/22/23 101.2 kg  11/10/23 99.3 kg  09/03/23 99.8  kg     EKG today demonstrates  SB, NST Vent. rate 56 BPM PR interval 178 ms QRS duration 96 ms QT/QTcB 440/424 ms   Echo 01/16/23 demonstrated   1. Left ventricular ejection fraction, by estimation, is 60 to 65%. The  left ventricle has normal function. The left ventricle has no regional  wall motion abnormalities. Left ventricular diastolic parameters were  normal.   2. Right ventricular  systolic function is normal. The right ventricular  size is normal.   3. Left atrial size was mildly dilated.   4. The mitral valve is normal in structure. Trivial mitral valve  regurgitation. No evidence of mitral stenosis.   5. The aortic valve is tricuspid. Aortic valve regurgitation is not  visualized. Aortic valve sclerosis is present, with no evidence of aortic  valve stenosis.   6. Aortic dilatation noted. There is mild dilatation of the aortic root,  measuring 40 mm. There is mild dilatation of the ascending aorta,  measuring 41 mm.   7. The inferior vena cava is normal in size with greater than 50%  respiratory variability, suggesting right atrial pressure of 3 mmHg.    CHA2DS2-VASc Score = 3  The patient's score is based upon: CHF History: 0 HTN History: 1 Diabetes History: 0 Stroke History: 0 Vascular Disease History: 0 Age Score: 2 Gender Score: 0       ASSESSMENT AND PLAN: Persistent Atrial Fibrillation (ICD10:  I48.19) The patient's CHA2DS2-VASc score is 3, indicating a 3.2% annual risk of stroke.   S/p afib ablation 11/10/23 Patient appears to be maintaining SR Continue Eliquis  5 mg BID with no missed doses for 3 months post ablation.  Continue Toprol  100 mg daily   Secondary Hypercoagulable State (ICD10:  D68.69) The patient is at significant risk for stroke/thromboembolism based upon his CHA2DS2-VASc Score of 3.  Continue Apixaban  (Eliquis ). No bleeding issues.   HTN Stable on current regimen   Follow up with Charlies Arthur as scheduled.    [facility] [address] [place], [place] [address] [phone]

## 2024-01-20 DIAGNOSIS — K5130 Ulcerative (chronic) rectosigmoiditis without complications: Secondary | ICD-10-CM | POA: Diagnosis not present

## 2024-02-09 NOTE — Progress Notes (Unsigned)
°  Cardiology Office Note:  .   Date:  02/09/2024  ID:  Joseph Smith, DOB 11-16-1945, MRN [id number] PCP: Pietro Redell RAMAN, MD  [facility] Providers Cardiologist:  Redell Pietro, MD Electrophysiologist:  Fonda Kitty, MD {  History of Present Illness: Joseph   TASHA Smith is a 78 y.o. male w/PMHx of  HTN, HLD Afib  Referred to the AFib clinic with recurrent AFib , planned for DCCV and EP referral  DCCV 08/21/23, successful  Saw Dr. Kitty 09/03/23, he preferred to avoid AAD, planned for ablation  AFib ablation 11/10/23  Saw the AFib clinic as usual on 12/22/23, once ~ 10 seconds of fast HRs, otherwise felt to be maintaining SR, no procedural concerns   Today's visit is scheduled as his 90 day post ablation visit ROS:   He feels well No symptoms of AFib, infrequent skipped/extra beat No CP, SOB No near syncope or syncope No bleeding or signs of bleeding  Arrhythmia/AAD hx AFib 2012  Studies Reviewed: Joseph    EKG done today and reviewed by myself:  SB 59bpm  11/10/23: EPS/ablation CONCLUSIONS: 1. Successful PVI. 2. Intracardiac echo reveals normal LV size and function, trivial pericardial effusion. 3. No early apparent complications. 4. Resume Eliquis  in recovery area.   Echo 01/16/23   1. Left ventricular ejection fraction, by estimation, is 60 to 65%. The  left ventricle has normal function. The left ventricle has no regional  wall motion abnormalities. Left ventricular diastolic parameters were  normal.   2. Right ventricular systolic function is normal. The right ventricular  size is normal.   3. Left atrial size was mildly dilated.   4. The mitral valve is normal in structure. Trivial mitral valve  regurgitation. No evidence of mitral stenosis.   5. The aortic valve is tricuspid. Aortic valve regurgitation is not  visualized. Aortic valve sclerosis is present, with no evidence of aortic  valve stenosis.   6. Aortic dilatation noted. There is mild  dilatation of the aortic root,  measuring 40 mm. There is mild dilatation of the ascending aorta,  measuring 41 mm.   7. The inferior vena cava is normal in size with greater than 50%  respiratory variability, suggesting right atrial pressure of 3 mmHg.     Risk Assessment/Calculations:    Physical Exam:   VS:  There were no vitals taken for this visit.   Wt Readings from Last 3 Encounters:  12/22/23 223 lb 3.2 oz (101.2 kg)  11/10/23 219 lb (99.3 kg)  09/03/23 220 lb (99.8 kg)    GEN: Well nourished, well developed in no acute distress NECK: No JVD; No carotid bruits CARDIAC: RRR, no murmurs, rubs, gallops RESPIRATORY:  CTA b/l without rales, wheezing or rhonchi  ABDOMEN: Soft, non-tender, non-distended EXTREMITIES: No edema; No deformity   ASSESSMENT AND PLAN: .    persistent AFib CHA2DS2Vasc is 3, on Eliquis , appropriately dosed Low/no burden by symptoms  HTN Looks ok  Secondary hypercoagulable state 2/2 AFib    Dispo: back in 13mo, sooner if needed  Signed, Charlies Macario Arthur, PA-C

## 2024-02-10 ENCOUNTER — Ambulatory Visit: Attending: Cardiovascular Disease | Admitting: Physician Assistant

## 2024-02-10 VITALS — BP 143/80 | HR 60 | Ht 73.0 in | Wt 219.0 lb

## 2024-02-10 DIAGNOSIS — D6869 Other thrombophilia: Secondary | ICD-10-CM

## 2024-02-10 DIAGNOSIS — I10 Essential (primary) hypertension: Secondary | ICD-10-CM | POA: Diagnosis not present

## 2024-02-10 DIAGNOSIS — I4819 Other persistent atrial fibrillation: Secondary | ICD-10-CM

## 2024-02-10 NOTE — Patient Instructions (Signed)
 Medication Instructions:   Your physician recommends that you continue on your current medications as directed. Please refer to the Current Medication list given to you today. *If you need a refill on your cardiac medications before your next appointment, please call your pharmacy*   Lab Work: NONE ORDERED  TODAY    If you have labs (blood work) drawn today and your tests are completely normal, you will receive your results only by: MyChart Message (if you have MyChart) OR A paper copy in the mail If you have any lab test that is abnormal or we need to change your treatment, we will call you to review the results.  Testing/Procedures: NONE ORDERED  TODAY    Follow-Up: At [facility], you and your health needs are our priority.  As part of our continuing mission to provide you with exceptional heart care, our providers are all part of one team.  This team includes your primary Cardiologist (physician) and Advanced Practice Providers or APPs (Physician Assistants and Nurse Practitioners) who all work together to provide you with the care you need, when you need it.  Your next appointment:   6 month(s)  Provider:   Fonda Kitty, MD    We recommend signing up for the patient portal called MyChart.  Sign up information is provided on this After Visit Summary.  MyChart is used to connect with patients for Virtual Visits (Telemedicine).  Patients are able to view lab/test results, encounter notes, upcoming appointments, etc.  Non-urgent messages can be sent to your provider as well.   To learn more about what you can do with MyChart, go to [email or web address].   Other Instructions

## 2024-03-12 ENCOUNTER — Other Ambulatory Visit: Payer: Self-pay | Admitting: Family Medicine

## 2024-03-12 DIAGNOSIS — L209 Atopic dermatitis, unspecified: Secondary | ICD-10-CM

## 2024-03-14 ENCOUNTER — Telehealth: Payer: Self-pay | Admitting: Cardiology

## 2024-03-14 NOTE — Telephone Encounter (Signed)
 Spoke with pt who reports he has been in Afib since Friday (1/16) afternoon.  HR - 80's.  Complains of some fatigue.  Pt states he does not want to see another PA he would prefer to see Dr Kennyth. Pt scheduled for Tuesday 03/15/24 at 4pm.  Reviewed ED precautions.  Pt verbalizes understanding and thanked CHARITY FUNDRAISER for the assistance.

## 2024-03-14 NOTE — Progress Notes (Unsigned)
 " Electrophysiology Office Note:   Date:  03/15/2024  ID:  Joseph Smith, Joseph Smith [birth year], MRN [id number]  Primary Cardiologist: Redell Shallow, MD Electrophysiologist: Fonda Kitty, MD      History of Present Illness:   Joseph Smith is a 79 y.o. male with h/o HTN, HLD, atrial fibrillation who is being seen today for follow up.  Ablation 11/10/23.   Discussed the use of AI scribe software for clinical note transcription with the patient, who gave verbal consent to proceed.  History of Present Illness Joseph Smith is a 79 year old male with atrial fibrillation who presents with recurrence of symptoms.  He experienced a recurrence of atrial fibrillation symptoms, which began on Saturday while fixing breakfast. He describes the sensation as his heart 'fluttering and skipping,' which has persisted since onset. He notes a significant difference in how he feels compared to when he is not in atrial fibrillation, specifically mentioning the onset of fatigue. No known triggers such as illness or stress were identified leading up to the recurrence.  He has a history of undergoing an ablation procedure to address his atrial fibrillation. He had been in normal rhythm for approximately four months following the procedure without any episodes until the recent recurrence. He is currently on a low dose of metoprolol  to manage his heart rate but is not on any antiarrhythmic medication to maintain normal rhythm. He uses a device to monitor his blood oxygen levels and heart rate, which helps him identify when he is in atrial fibrillation.  In terms of sleep, he denies having insomnia or sleep apnea and reports sleeping well. He uses lorazepam  occasionally for stress-related sleep disturbances. He recalls having a nuclear stress test many years ago, which was normal. He has not had a recent stress test. He denies any recent changes in physical activity and states he was active up until the recurrence of atrial  fibrillation symptoms.  Review of systems complete and found to be negative unless listed in HPI.   EP Information / Studies Reviewed:    EKG is ordered today. Personal review as below.  EKG Interpretation Date/Time:  Tuesday March 15 2024 15:42:48 EST Ventricular Rate:  106 PR Interval:    QRS Duration:  92 QT Interval:  334 QTC Calculation: 443 R Axis:   22  Text Interpretation: Atrial fibrillation with rapid ventricular response with premature ventricular or aberrantly conducted complexes Incomplete right bundle branch block Nonspecific ST and T wave abnormality When compared with ECG of 10-Feb-2024 15:02, Atrial fibrillation has replaced Sinus rhythm Vent. rate has increased BY  47 BPM Nonspecific T wave abnormality now evident in Anterolateral leads Confirmed by Kitty Fonda [phone]) on 03/15/2024 8:51:28 PM   EKG 08/19/23:    Echo 12/2022:  1. Left ventricular ejection fraction, by estimation, is 60 to 65%. The  left ventricle has normal function. The left ventricle has no regional  wall motion abnormalities. Left ventricular diastolic parameters were  normal.   2. Right ventricular systolic function is normal. The right ventricular  size is normal.   3. Left atrial size was mildly dilated.   4. The mitral valve is normal in structure. Trivial mitral valve  regurgitation. No evidence of mitral stenosis.   5. The aortic valve is tricuspid. Aortic valve regurgitation is not  visualized. Aortic valve sclerosis is present, with no evidence of aortic  valve stenosis.   6. Aortic dilatation noted. There is mild dilatation of the aortic root,  measuring 40 mm.  There is mild dilatation of the ascending aorta,  measuring 41 mm.   7. The inferior vena cava is normal in size with greater than 50%  respiratory variability, suggesting right atrial pressure of 3 mmHg.    Risk Assessment/Calculations:    CHA2DS2-VASc Score = 3   This indicates a 3.2% annual risk of stroke. The  patient's score is based upon: CHF History: 0 HTN History: 1 Diabetes History: 0 Stroke History: 0 Vascular Disease History: 0 Age Score: 2 Gender Score: 0         Physical Exam:   VS:  BP 120/78   Pulse (!) 106   Ht 6' 1 (1.854 m)   Wt 221 lb (100.2 kg)   SpO2 96%   BMI 29.16 kg/m    Wt Readings from Last 3 Encounters:  03/15/24 221 lb (100.2 kg)  02/10/24 219 lb (99.3 kg)  12/22/23 223 lb 3.2 oz (101.2 kg)     GEN: Well nourished, well developed in no acute distress NECK: No JVD CARDIAC: Tachycardic, irregular rhythm.  RESPIRATORY:  Clear to auscultation without rales, wheezing or rhonchi  ABDOMEN: Soft, non-distended EXTREMITIES:  No edema; No deformity   ASSESSMENT AND PLAN:    #Persistent atrial fibrillation, symptomatic: Unfortunately, patient has had recurrence of AF 4 months after ablation. He is symptomatic, primarily with fatigue and exertional intolerance.  #Secondary hypercoagulable state due to AF:  -Discussed treatment options today for AF including antiarrhythmic drug therapy and repeat ablation. Discussed risks, recovery and likelihood of success with each treatment strategy. Patient would like to trial anti-arrhythmic therapy before pursuing repeat ablation. We will start Multaq  and perform DCCV 2 weeks later. Multaq  should help with rate control in the interim. He will return to clinic 1 month after cardioversion to assess for ability to maintain sinus rhythm and plans for long-term rhythm control. He would be a candidate for repeat ablation should he choose to do so.  -Continue Eliquis  5mg  BID.   #Hypertension -At goal today.  Recommend checking blood pressures 1-2 times per week at home and recording the values.  Recommend bringing these recordings to the primary care physician.   Follow up with Dr. Kennyth 3 months after ablation.   Signed, Fonda Kennyth, MD  "

## 2024-03-14 NOTE — Telephone Encounter (Signed)
 Patient c/o Palpitations: STAT if patient c/o lightheadedness, shortness of breath, or chest pain  How long have you had palpitations/irregular HR/ Afib? Are you having the symptoms now? Since Friday around noon  Are you currently experiencing lightheadedness, SOB or CP? No  Do you have a history of afib (atrial fibrillation) or irregular heart rhythm? Yes  Have you checked your BP or HR? (document readings if available): No   Are you experiencing any other symptoms? No

## 2024-03-15 ENCOUNTER — Other Ambulatory Visit (HOSPITAL_COMMUNITY): Payer: Self-pay

## 2024-03-15 ENCOUNTER — Ambulatory Visit: Attending: Cardiology | Admitting: Cardiology

## 2024-03-15 ENCOUNTER — Encounter: Payer: Self-pay | Admitting: Cardiology

## 2024-03-15 VITALS — BP 120/78 | HR 106 | Ht 73.0 in | Wt 221.0 lb

## 2024-03-15 DIAGNOSIS — D6869 Other thrombophilia: Secondary | ICD-10-CM | POA: Diagnosis not present

## 2024-03-15 DIAGNOSIS — I4819 Other persistent atrial fibrillation: Secondary | ICD-10-CM

## 2024-03-15 DIAGNOSIS — I10 Essential (primary) hypertension: Secondary | ICD-10-CM

## 2024-03-15 MED ORDER — DRONEDARONE HCL 400 MG PO TABS
400.0000 mg | ORAL_TABLET | Freq: Two times a day (BID) | ORAL | 3 refills | Status: AC
  Filled 2024-03-15: qty 60, 30d supply, fill #0

## 2024-03-15 NOTE — Patient Instructions (Signed)
 Medication Instructions:  Your physician has recommended you make the following change in your medication:  1) START taking Multaq  400 mg twice daily   *If you need a refill on your cardiac medications before your next appointment, please call your pharmacy*  Lab Work: TODAY: BMET and CBC  Testing/Procedures: Cardioversion Your physician has recommended that you have a Cardioversion (DCCV). Electrical Cardioversion uses a jolt of electricity to your heart either through paddles or wired patches attached to your chest. This is a controlled, usually prescheduled, procedure. Defibrillation is done under light anesthesia in the hospital, and you usually go home the day of the procedure. This is done to get your heart back into a normal rhythm. You are not awake for the procedure. Please see the instruction sheet given to you today.  Follow-Up: At [facility], you and your health needs are our priority.  As part of our continuing mission to provide you with exceptional heart care, our providers are all part of one team.  This team includes your primary Cardiologist (physician) and Advanced Practice Providers or APPs (Physician Assistants and Nurse Practitioners) who all work together to provide you with the care you need, when you need it.  Your next appointment:   6-8 weeks  Provider:   Fonda Kitty, MD

## 2024-03-16 ENCOUNTER — Telehealth: Payer: Self-pay | Admitting: Cardiology

## 2024-03-16 DIAGNOSIS — I4819 Other persistent atrial fibrillation: Secondary | ICD-10-CM

## 2024-03-16 LAB — BASIC METABOLIC PANEL WITH GFR
BUN/Creatinine Ratio: 16 (ref 10–24)
BUN: 20 mg/dL (ref 8–27)
CO2: 17 mmol/L — AB (ref 20–29)
Calcium: 9.6 mg/dL (ref 8.6–10.2)
Chloride: 100 mmol/L (ref 96–106)
Creatinine, Ser: 1.23 mg/dL (ref 0.76–1.27)
Glucose: 113 mg/dL — AB (ref 70–99)
Potassium: 4.3 mmol/L (ref 3.5–5.2)
Sodium: 138 mmol/L (ref 134–144)
eGFR: 60 mL/min/1.73

## 2024-03-16 LAB — CBC
Hematocrit: 44.4 % (ref 37.5–51.0)
Hemoglobin: 14.9 g/dL (ref 13.0–17.7)
MCH: 34.3 pg — ABNORMAL HIGH (ref 26.6–33.0)
MCHC: 33.6 g/dL (ref 31.5–35.7)
MCV: 102 fL — ABNORMAL HIGH (ref 79–97)
Platelets: 262 x10E3/uL (ref 150–450)
RBC: 4.35 x10E6/uL (ref 4.14–5.80)
RDW: 12.9 % (ref 11.6–15.4)
WBC: 7.3 x10E3/uL (ref 3.4–10.8)

## 2024-03-16 NOTE — Telephone Encounter (Signed)
 Pt c/o medication issue:  1. Name of Medication: dronedarone  (MULTAQ ) 400 MG tablet   2. How are you currently taking this medication (dosage and times per day)? N/A   3. Are you having a reaction (difficulty breathing--STAT)? no  4. What is your medication issue? Pt states medication is too expensive and is looking for a cheaper option. Please advise.

## 2024-03-16 NOTE — Telephone Encounter (Signed)
 Left message for patient to call back

## 2024-03-17 ENCOUNTER — Telehealth: Payer: Self-pay | Admitting: Cardiology

## 2024-03-17 NOTE — Telephone Encounter (Signed)
 Pt scheduled for Lexi 2/5 DCCV scheduled for 2/3  Aware will forward to provider for advisement.  Informed pt that most likely we will push back DCCV, but will await MD answer.  (Pt agreeable to rescheduling the DCCV to after hopefully starting Flecainide)  Pt agreeable to this and states that when we call back if he does not answer, that we may leave him a detailed message with instructions/advisement.

## 2024-03-17 NOTE — Telephone Encounter (Signed)
 Patient wants a call back to discuss next steps with his upcoming procedure and testing.  Patient stated can leave message if he is to go to the cardioversion on not.

## 2024-03-17 NOTE — Addendum Note (Signed)
 Addended by: GRETEL MAEOLA CROME on: 03/17/2024 09:11 AM   Modules accepted: Orders

## 2024-03-17 NOTE — Telephone Encounter (Signed)
 Pt agreeable to MD advisement.  Pt reports that he has a bum knee and cannot walk on a treadmill.  Aware Lexi myoview  is being ordered and someone would contact him to schedule this testing.  He understands MD will determine medication post testing. Patient verbalized understanding and agreeable to plan.

## 2024-03-17 NOTE — Telephone Encounter (Signed)
 Pt is returning call to nurse.

## 2024-03-18 ENCOUNTER — Telehealth: Payer: Self-pay | Admitting: Cardiology

## 2024-03-18 NOTE — Telephone Encounter (Signed)
Pt returning call, please advise.

## 2024-03-18 NOTE — Telephone Encounter (Signed)
 Left message for patient to call back

## 2024-03-18 NOTE — Telephone Encounter (Signed)
 Please see previous encounter.

## 2024-03-18 NOTE — Telephone Encounter (Signed)
 Spoke with the patient and rescheduled his cardioversion to 2/17. He is aware of his new date and time. Other instructions remain the same.

## 2024-03-20 ENCOUNTER — Ambulatory Visit: Payer: Self-pay | Admitting: Cardiology

## 2024-03-24 ENCOUNTER — Telehealth (HOSPITAL_COMMUNITY): Payer: Self-pay | Admitting: *Deleted

## 2024-03-24 NOTE — Telephone Encounter (Signed)
 Left detailed instructions for stress test.

## 2024-03-28 ENCOUNTER — Other Ambulatory Visit (HOSPITAL_COMMUNITY): Payer: Self-pay

## 2024-03-28 ENCOUNTER — Telehealth: Payer: Self-pay | Admitting: Cardiovascular Disease

## 2024-03-28 NOTE — Telephone Encounter (Signed)
 Pt called in to reschedule cardioversion. Please advise.

## 2024-03-29 ENCOUNTER — Other Ambulatory Visit: Payer: Self-pay | Admitting: Cardiology

## 2024-03-29 DIAGNOSIS — Z01818 Encounter for other preprocedural examination: Secondary | ICD-10-CM

## 2024-03-29 DIAGNOSIS — I4819 Other persistent atrial fibrillation: Secondary | ICD-10-CM

## 2024-03-29 NOTE — Telephone Encounter (Signed)
 Rescheduled pt to requested day/time. Pt aware of change and to arrive at 9am on 2/24 for  DCCV  (Dr. Michele to perform).  (Will not do another letter). Aware all instructions remain the same, only change is day/time. Patient verbalized understanding and agreeable to plan.

## 2024-03-31 ENCOUNTER — Ambulatory Visit (HOSPITAL_COMMUNITY)
Admission: RE | Admit: 2024-03-31 | Discharge: 2024-03-31 | Disposition: A | Source: Ambulatory Visit | Attending: Cardiology | Admitting: Cardiology

## 2024-03-31 DIAGNOSIS — I4819 Other persistent atrial fibrillation: Secondary | ICD-10-CM

## 2024-03-31 LAB — MYOCARDIAL PERFUSION IMAGING
LV dias vol: 140 mL (ref 62–150)
LV sys vol: 40 mL
Nuc Stress EF: 71 %
Peak HR: 93 {beats}/min
Rest HR: 71 {beats}/min
Rest Nuclear Isotope Dose: 10.6 mCi
SDS: 0
SRS: 7
SSS: 1
ST Depression (mm): 0 mm
Stress Nuclear Isotope Dose: 32.5 mCi
TID: 0.99

## 2024-03-31 MED ORDER — TECHNETIUM TC 99M TETROFOSMIN IV KIT
10.6000 | PACK | Freq: Once | INTRAVENOUS | Status: AC | PRN
  Administered 2024-03-31: 10.6 via INTRAVENOUS

## 2024-03-31 MED ORDER — TECHNETIUM TC 99M TETROFOSMIN IV KIT
32.5000 | PACK | Freq: Once | INTRAVENOUS | Status: AC | PRN
  Administered 2024-03-31: 32.5 via INTRAVENOUS

## 2024-03-31 MED ORDER — REGADENOSON 0.4 MG/5ML IV SOLN
0.4000 mg | Freq: Once | INTRAVENOUS | Status: AC
  Administered 2024-03-31: 0.4 mg via INTRAVENOUS

## 2024-03-31 MED ORDER — REGADENOSON 0.4 MG/5ML IV SOLN
INTRAVENOUS | Status: AC
  Filled 2024-03-31: qty 5

## 2024-03-31 NOTE — Progress Notes (Unsigned)
 "    HPI: FU atrial fibrillation.  Abdominal ultrasound October 2014 showed no aneurysm. Monitor April 2021 showed occasional PAC, PVC, brief PAT and 4 beats nonsustained ventricular tachycardia. Echocardiogram November 2024 showed normal LV function, mild left atrial enlargement, mildly dilated aortic root at 40 mm with ascending aorta 41 mm.  Had atrial fibrillation ablation September 2025.  Seen in the office January 2026 with recurrent atrial fibrillation.  Patient initiated on Multaq  with plans for repeat cardioversion 2 weeks later (scheduled for February 24).  Nuclear study February 2026 showed .  Since he was last seen,   Current Outpatient Medications  Medication Sig Dispense Refill   acetaminophen  (TYLENOL ) 500 MG tablet Take 1,000 mg by mouth every 6 (six) hours as needed for moderate pain (pain score 4-6). (Patient taking differently: Take 1,000 mg by mouth as needed for moderate pain (pain score 4-6).)     amLODipine  (NORVASC ) 10 MG tablet TAKE 1 TABLET(10 MG) BY MOUTH DAILY 30 tablet 3   benazepril  (LOTENSIN ) 40 MG tablet Take 1 tablet (40 mg total) by mouth daily. 90 tablet 3   dronedarone  (MULTAQ ) 400 MG tablet Take 1 tablet (400 mg total) by mouth 2 (two) times daily with a meal. 180 tablet 3   ELIQUIS  5 MG TABS tablet TAKE 1 TABLET(5 MG) BY MOUTH TWICE DAILY 180 tablet 1   metoprolol  succinate (TOPROL -XL) 100 MG 24 hr tablet Take 1 tablet (100 mg total) by mouth daily. 90 tablet 1   Naphazoline-Pheniramine (VISINE OP) Place 1 drop into both eyes daily.     omeprazole  (PRILOSEC) 40 MG capsule Take 1 capsule (40 mg total) by mouth daily. 90 capsule 3   pravastatin  (PRAVACHOL ) 80 MG tablet Take 80 mg by mouth daily.     triamcinolone  cream (KENALOG ) 0.1 % Apply topically 2 (two) times daily as needed. 80 g 1   No current facility-administered medications for this visit.     Past Medical History:  Diagnosis Date   Atrial fibrillation (HCC)    Chest pain, unspecified     Esophageal reflux    Family history of ischemic heart disease    Other and unspecified hyperlipidemia    Palpitations    Ulcerative colitis    Unspecified essential hypertension     Past Surgical History:  Procedure Laterality Date   ATRIAL FIBRILLATION ABLATION N/A 11/10/2023   Procedure: ATRIAL FIBRILLATION ABLATION;  Surgeon: Kennyth Chew, MD;  Location: [facility] INVASIVE CV LAB;  Service: Cardiovascular;  Laterality: N/A;   CARDIOVERSION N/A 01/05/2023   Procedure: CARDIOVERSION;  Surgeon: Lonni Slain, MD;  Location: [facility] INVASIVE CV LAB;  Service: Cardiovascular;  Laterality: N/A;   CARDIOVERSION N/A 08/21/2023   Procedure: CARDIOVERSION;  Surgeon: Kate Lonni CROME, MD;  Location: [facility] INVASIVE CV LAB;  Service: Cardiovascular;  Laterality: N/A;   ELBOW SURGERY     left   KNEE ARTHROSCOPY     rihgt   VASECTOMY      Social History   Socioeconomic History   Marital status: Married    Spouse name: Not on file   Number of children: Not on file   Years of education: Not on file   Highest education level: Not on file  Occupational History   Not on file  Tobacco Use   Smoking status: Former    Types: Cigarettes, Cigars   Smokeless tobacco: Never   Tobacco comments:    Former smoker 08/19/23  Substance and Sexual Activity   Alcohol use: Yes  Alcohol/week: 0.0 standard drinks of alcohol    Comment: pt has about 10 drinks or varying type on average throughout one week    Drug use: No   Sexual activity: Yes  Other Topics Concern   Not on file  Social History Narrative   Married. Education: [employer]. Exercise: Weekly.   Social Drivers of Health   Tobacco Use: Medium Risk (03/15/2024)   Patient History    Smoking Tobacco Use: Former    Smokeless Tobacco Use: Never    Passive Exposure: Not on file  Financial Resource Strain: Low Risk (04/28/2023)   Received from [facility]   Overall Financial Resource Strain (CARDIA)    Difficulty of Paying Living Expenses:  Not hard at all  Food Insecurity: No Food Insecurity (04/28/2023)   Received from [facility]   Epic    Within the past 12 months, you worried that your food would run out before you got the money to buy more.: Never true    Within the past 12 months, the food you bought just didn't last and you didn't have money to get more.: Never true  Transportation Needs: No Transportation Needs (04/28/2023)   Received from [facility] - Transportation    Lack of Transportation (Medical): No    Lack of Transportation (Non-Medical): No  Physical Activity: Not on file  Stress: No Stress Concern Present (03/27/2023)   Received from [facility] - Occupational Stress Questionnaire    Feeling of Stress : Not at all  Social Connections: Not on file  Intimate Partner Violence: Not At Risk (12/09/2022)   Received from [facility]   HITS    Over the last 12 months how often did your partner physically hurt you?: Never    Over the last 12 months how often did your partner insult you or talk down to you?: Never    Over the last 12 months how often did your partner threaten you with physical harm?: Never    Over the last 12 months how often did your partner scream or curse at you?: Never  Depression (PHQ2-9): Low Risk (02/12/2022)   Depression (PHQ2-9)    PHQ-2 Score: 0  Alcohol Screen: Not on file  Housing: Low Risk (04/28/2023)   Received from [facility]    In the last 12 months, was there a time when you were not able to pay the mortgage or rent on time?: No    In the past 12 months, how many times have you moved where you were living?: 0    At any time in the past 12 months, were you homeless or living in a shelter (including now)?: No  Utilities: Not At Risk (04/28/2023)   Received from [facility] Utilities    Threatened with loss of utilities: No  Health Literacy: Not on file    Family History  Problem Relation Age of Onset    Stroke Brother    Coronary artery disease Other        family history    ROS: no fevers or chills, productive cough, hemoptysis, dysphasia, odynophagia, melena, hematochezia, dysuria, hematuria, rash, seizure activity, orthopnea, PND, pedal edema, claudication. Remaining systems are negative.  Physical Exam: Well-developed well-nourished in no acute distress.  Skin is warm and dry.  HEENT is normal.  Neck is supple.  Chest is clear to auscultation with normal expansion.  Cardiovascular exam is regular rate and  rhythm.  Abdominal exam nontender or distended. No masses palpated. Extremities show no edema. neuro grossly intact  ECG- personally reviewed  A/P  1 persistent atrial fibrillation-patient is status post ablation of atrial fibrillation but has had recurrence.  He was seen by Dr. Kennyth and Multaq  was initiated.  Plan is to proceed with cardioversion on February 24.  Continue apixaban .  2 hypertension-patient's blood pressure is controlled.  Continue present medical regimen.  3 hyperlipidemia-continue statin.  Redell Shallow, MD    "

## 2024-04-11 ENCOUNTER — Ambulatory Visit: Admitting: Cardiology

## 2024-04-12 DIAGNOSIS — Z01818 Encounter for other preprocedural examination: Secondary | ICD-10-CM

## 2024-04-19 ENCOUNTER — Encounter (HOSPITAL_COMMUNITY): Payer: Self-pay

## 2024-04-19 ENCOUNTER — Ambulatory Visit (HOSPITAL_COMMUNITY): Admit: 2024-04-19 | Admitting: Cardiovascular Disease

## 2024-04-19 DIAGNOSIS — Z01818 Encounter for other preprocedural examination: Secondary | ICD-10-CM

## 2024-05-10 ENCOUNTER — Ambulatory Visit: Admitting: Cardiology
